# Patient Record
Sex: Female | Born: 1940 | Race: White | Hispanic: No | Marital: Married | Smoking: Current every day smoker
Health system: Southern US, Community
[De-identification: ages and names within clinical notes are randomized; demographics above are authoritative.]

## PROBLEM LIST (undated history)

## (undated) DIAGNOSIS — N182 Chronic kidney disease, stage 2 (mild): Secondary | ICD-10-CM

## (undated) DIAGNOSIS — G43909 Migraine, unspecified, not intractable, without status migrainosus: Secondary | ICD-10-CM

## (undated) DIAGNOSIS — C50919 Malignant neoplasm of unspecified site of unspecified female breast: Secondary | ICD-10-CM

## (undated) DIAGNOSIS — Z9989 Dependence on other enabling machines and devices: Secondary | ICD-10-CM

## (undated) DIAGNOSIS — J302 Other seasonal allergic rhinitis: Secondary | ICD-10-CM

## (undated) DIAGNOSIS — E119 Type 2 diabetes mellitus without complications: Secondary | ICD-10-CM

## (undated) DIAGNOSIS — I509 Heart failure, unspecified: Secondary | ICD-10-CM

## (undated) DIAGNOSIS — E039 Hypothyroidism, unspecified: Secondary | ICD-10-CM

## (undated) DIAGNOSIS — M1990 Unspecified osteoarthritis, unspecified site: Secondary | ICD-10-CM

## (undated) DIAGNOSIS — I259 Chronic ischemic heart disease, unspecified: Secondary | ICD-10-CM

## (undated) DIAGNOSIS — E669 Obesity, unspecified: Secondary | ICD-10-CM

## (undated) DIAGNOSIS — F329 Major depressive disorder, single episode, unspecified: Secondary | ICD-10-CM

## (undated) DIAGNOSIS — G4733 Obstructive sleep apnea (adult) (pediatric): Secondary | ICD-10-CM

## (undated) DIAGNOSIS — J189 Pneumonia, unspecified organism: Secondary | ICD-10-CM

## (undated) DIAGNOSIS — K219 Gastro-esophageal reflux disease without esophagitis: Secondary | ICD-10-CM

## (undated) DIAGNOSIS — E785 Hyperlipidemia, unspecified: Secondary | ICD-10-CM

## (undated) DIAGNOSIS — I10 Essential (primary) hypertension: Secondary | ICD-10-CM

## (undated) DIAGNOSIS — I219 Acute myocardial infarction, unspecified: Secondary | ICD-10-CM

## (undated) DIAGNOSIS — Z8719 Personal history of other diseases of the digestive system: Secondary | ICD-10-CM

## (undated) DIAGNOSIS — F32A Depression, unspecified: Secondary | ICD-10-CM

## (undated) HISTORY — DX: Hypothyroidism, unspecified: E03.9

## (undated) HISTORY — DX: Depression, unspecified: F32.A

## (undated) HISTORY — PX: SHOULDER ARTHROSCOPY W/ ROTATOR CUFF REPAIR: SHX2400

## (undated) HISTORY — DX: Gastro-esophageal reflux disease without esophagitis: K21.9

## (undated) HISTORY — DX: Major depressive disorder, single episode, unspecified: F32.9

## (undated) HISTORY — DX: Chronic ischemic heart disease, unspecified: I25.9

## (undated) HISTORY — DX: Hyperlipidemia, unspecified: E78.5

## (undated) HISTORY — DX: Essential (primary) hypertension: I10

## (undated) HISTORY — PX: KNEE ARTHROSCOPY: SHX127

## (undated) HISTORY — PX: DILATION AND CURETTAGE OF UTERUS: SHX78

## (undated) HISTORY — PX: CHOLECYSTECTOMY OPEN: SUR202

## (undated) HISTORY — DX: Obesity, unspecified: E66.9

## (undated) HISTORY — PX: CATARACT EXTRACTION W/ INTRAOCULAR LENS  IMPLANT, BILATERAL: SHX1307

---

## 1997-09-19 ENCOUNTER — Other Ambulatory Visit: Admission: RE | Admit: 1997-09-19 | Discharge: 1997-09-19 | Payer: Self-pay | Admitting: *Deleted

## 1998-03-24 DIAGNOSIS — C50919 Malignant neoplasm of unspecified site of unspecified female breast: Secondary | ICD-10-CM

## 1998-03-24 HISTORY — PX: BREAST LUMPECTOMY: SHX2

## 1998-03-24 HISTORY — PX: BREAST BIOPSY: SHX20

## 1998-03-24 HISTORY — DX: Malignant neoplasm of unspecified site of unspecified female breast: C50.919

## 1998-08-14 ENCOUNTER — Ambulatory Visit (HOSPITAL_COMMUNITY): Admission: RE | Admit: 1998-08-14 | Discharge: 1998-08-14 | Payer: Self-pay | Admitting: *Deleted

## 1998-08-14 ENCOUNTER — Encounter: Payer: Self-pay | Admitting: *Deleted

## 1998-08-22 ENCOUNTER — Ambulatory Visit (HOSPITAL_COMMUNITY): Admission: RE | Admit: 1998-08-22 | Discharge: 1998-08-22 | Payer: Self-pay | Admitting: *Deleted

## 1998-08-22 ENCOUNTER — Encounter: Payer: Self-pay | Admitting: *Deleted

## 1998-09-20 ENCOUNTER — Other Ambulatory Visit: Admission: RE | Admit: 1998-09-20 | Discharge: 1998-09-20 | Payer: Self-pay | Admitting: *Deleted

## 1999-01-28 ENCOUNTER — Encounter: Payer: Self-pay | Admitting: *Deleted

## 1999-01-28 ENCOUNTER — Encounter (INDEPENDENT_AMBULATORY_CARE_PROVIDER_SITE_OTHER): Payer: Self-pay | Admitting: Specialist

## 1999-01-28 ENCOUNTER — Ambulatory Visit (HOSPITAL_COMMUNITY): Admission: RE | Admit: 1999-01-28 | Discharge: 1999-01-28 | Payer: Self-pay | Admitting: *Deleted

## 1999-02-05 ENCOUNTER — Encounter: Payer: Self-pay | Admitting: General Surgery

## 1999-02-06 ENCOUNTER — Ambulatory Visit (HOSPITAL_COMMUNITY): Admission: RE | Admit: 1999-02-06 | Discharge: 1999-02-07 | Payer: Self-pay | Admitting: General Surgery

## 1999-02-06 ENCOUNTER — Encounter (INDEPENDENT_AMBULATORY_CARE_PROVIDER_SITE_OTHER): Payer: Self-pay | Admitting: *Deleted

## 1999-02-06 ENCOUNTER — Encounter: Payer: Self-pay | Admitting: General Surgery

## 1999-02-18 ENCOUNTER — Encounter: Admission: RE | Admit: 1999-02-18 | Discharge: 1999-05-19 | Payer: Self-pay | Admitting: Radiation Oncology

## 1999-02-25 ENCOUNTER — Encounter (HOSPITAL_COMMUNITY): Payer: Self-pay | Admitting: Oncology

## 1999-02-25 ENCOUNTER — Ambulatory Visit (HOSPITAL_COMMUNITY): Admission: RE | Admit: 1999-02-25 | Discharge: 1999-02-25 | Payer: Self-pay | Admitting: Oncology

## 1999-03-05 ENCOUNTER — Ambulatory Visit (HOSPITAL_COMMUNITY): Admission: RE | Admit: 1999-03-05 | Discharge: 1999-03-06 | Payer: Self-pay | Admitting: General Surgery

## 1999-03-05 ENCOUNTER — Encounter: Payer: Self-pay | Admitting: General Surgery

## 1999-03-05 ENCOUNTER — Encounter (INDEPENDENT_AMBULATORY_CARE_PROVIDER_SITE_OTHER): Payer: Self-pay | Admitting: *Deleted

## 1999-07-10 ENCOUNTER — Encounter: Admission: RE | Admit: 1999-07-10 | Discharge: 1999-10-08 | Payer: Self-pay | Admitting: Radiation Oncology

## 1999-07-12 ENCOUNTER — Encounter: Admission: RE | Admit: 1999-07-12 | Discharge: 1999-07-12 | Payer: Self-pay | Admitting: Radiation Oncology

## 1999-09-05 ENCOUNTER — Emergency Department (HOSPITAL_COMMUNITY): Admission: EM | Admit: 1999-09-05 | Discharge: 1999-09-05 | Payer: Self-pay | Admitting: Emergency Medicine

## 1999-09-05 ENCOUNTER — Encounter: Payer: Self-pay | Admitting: Emergency Medicine

## 1999-09-27 ENCOUNTER — Ambulatory Visit (HOSPITAL_BASED_OUTPATIENT_CLINIC_OR_DEPARTMENT_OTHER): Admission: RE | Admit: 1999-09-27 | Discharge: 1999-09-27 | Payer: Self-pay | Admitting: General Surgery

## 1999-10-09 ENCOUNTER — Other Ambulatory Visit: Admission: RE | Admit: 1999-10-09 | Discharge: 1999-10-09 | Payer: Self-pay | Admitting: *Deleted

## 1999-12-23 ENCOUNTER — Encounter: Payer: Self-pay | Admitting: Endocrinology

## 1999-12-23 ENCOUNTER — Encounter: Admission: RE | Admit: 1999-12-23 | Discharge: 1999-12-23 | Payer: Self-pay | Admitting: Endocrinology

## 2000-04-06 ENCOUNTER — Encounter: Admission: RE | Admit: 2000-04-06 | Discharge: 2000-06-17 | Payer: Self-pay | Admitting: Oncology

## 2000-07-28 ENCOUNTER — Encounter: Payer: Self-pay | Admitting: General Surgery

## 2000-07-28 ENCOUNTER — Encounter: Admission: RE | Admit: 2000-07-28 | Discharge: 2000-07-28 | Payer: Self-pay | Admitting: General Surgery

## 2000-10-14 ENCOUNTER — Other Ambulatory Visit: Admission: RE | Admit: 2000-10-14 | Discharge: 2000-10-14 | Payer: Self-pay | Admitting: Obstetrics and Gynecology

## 2001-01-01 ENCOUNTER — Encounter: Payer: Self-pay | Admitting: *Deleted

## 2001-01-01 ENCOUNTER — Encounter: Admission: RE | Admit: 2001-01-01 | Discharge: 2001-01-01 | Payer: Self-pay | Admitting: *Deleted

## 2001-04-28 ENCOUNTER — Ambulatory Visit (HOSPITAL_COMMUNITY): Admission: RE | Admit: 2001-04-28 | Discharge: 2001-04-28 | Payer: Self-pay | Admitting: Oncology

## 2001-04-28 ENCOUNTER — Encounter (HOSPITAL_COMMUNITY): Payer: Self-pay | Admitting: Oncology

## 2001-06-02 ENCOUNTER — Ambulatory Visit (HOSPITAL_BASED_OUTPATIENT_CLINIC_OR_DEPARTMENT_OTHER): Admission: RE | Admit: 2001-06-02 | Discharge: 2001-06-02 | Payer: Self-pay | Admitting: Otolaryngology

## 2001-07-23 ENCOUNTER — Encounter (INDEPENDENT_AMBULATORY_CARE_PROVIDER_SITE_OTHER): Payer: Self-pay | Admitting: Specialist

## 2001-07-23 ENCOUNTER — Ambulatory Visit (HOSPITAL_COMMUNITY): Admission: RE | Admit: 2001-07-23 | Discharge: 2001-07-23 | Payer: Self-pay | Admitting: *Deleted

## 2001-11-15 ENCOUNTER — Ambulatory Visit (HOSPITAL_BASED_OUTPATIENT_CLINIC_OR_DEPARTMENT_OTHER): Admission: RE | Admit: 2001-11-15 | Discharge: 2001-11-15 | Payer: Self-pay | Admitting: Internal Medicine

## 2001-12-28 ENCOUNTER — Other Ambulatory Visit: Admission: RE | Admit: 2001-12-28 | Discharge: 2001-12-28 | Payer: Self-pay | Admitting: Obstetrics and Gynecology

## 2002-01-03 ENCOUNTER — Encounter: Payer: Self-pay | Admitting: *Deleted

## 2002-01-03 ENCOUNTER — Encounter: Admission: RE | Admit: 2002-01-03 | Discharge: 2002-01-03 | Payer: Self-pay | Admitting: *Deleted

## 2002-04-26 ENCOUNTER — Encounter (HOSPITAL_COMMUNITY): Payer: Self-pay | Admitting: Oncology

## 2002-04-26 ENCOUNTER — Ambulatory Visit (HOSPITAL_COMMUNITY): Admission: RE | Admit: 2002-04-26 | Discharge: 2002-04-26 | Payer: Self-pay | Admitting: Oncology

## 2002-05-06 ENCOUNTER — Inpatient Hospital Stay (HOSPITAL_COMMUNITY): Admission: EM | Admit: 2002-05-06 | Discharge: 2002-05-10 | Payer: Self-pay

## 2002-05-06 ENCOUNTER — Encounter: Payer: Self-pay | Admitting: Emergency Medicine

## 2002-05-09 HISTORY — PX: CARDIAC CATHETERIZATION: SHX172

## 2002-05-15 ENCOUNTER — Emergency Department (HOSPITAL_COMMUNITY): Admission: EM | Admit: 2002-05-15 | Discharge: 2002-05-15 | Payer: Self-pay | Admitting: Emergency Medicine

## 2002-05-15 ENCOUNTER — Encounter: Payer: Self-pay | Admitting: Emergency Medicine

## 2002-05-30 ENCOUNTER — Encounter (HOSPITAL_COMMUNITY): Admission: RE | Admit: 2002-05-30 | Discharge: 2002-07-07 | Payer: Self-pay | Admitting: Cardiology

## 2002-07-12 ENCOUNTER — Encounter (HOSPITAL_COMMUNITY): Admission: RE | Admit: 2002-07-12 | Discharge: 2002-10-10 | Payer: Self-pay | Admitting: Cardiology

## 2002-09-29 ENCOUNTER — Ambulatory Visit: Admission: RE | Admit: 2002-09-29 | Discharge: 2002-09-29 | Payer: Self-pay | Admitting: Oncology

## 2002-10-23 ENCOUNTER — Encounter (HOSPITAL_COMMUNITY): Admission: RE | Admit: 2002-10-23 | Discharge: 2003-01-21 | Payer: Self-pay | Admitting: Cardiology

## 2002-10-25 ENCOUNTER — Encounter: Admission: RE | Admit: 2002-10-25 | Discharge: 2003-01-23 | Payer: Self-pay | Admitting: Oncology

## 2002-11-14 ENCOUNTER — Observation Stay (HOSPITAL_COMMUNITY): Admission: RE | Admit: 2002-11-14 | Discharge: 2002-11-15 | Payer: Self-pay | Admitting: Cardiology

## 2002-11-14 HISTORY — PX: CARDIAC CATHETERIZATION: SHX172

## 2003-01-05 ENCOUNTER — Encounter: Admission: RE | Admit: 2003-01-05 | Discharge: 2003-01-05 | Payer: Self-pay | Admitting: General Surgery

## 2003-01-05 ENCOUNTER — Encounter: Payer: Self-pay | Admitting: General Surgery

## 2003-01-17 ENCOUNTER — Other Ambulatory Visit: Admission: RE | Admit: 2003-01-17 | Discharge: 2003-01-17 | Payer: Self-pay | Admitting: Obstetrics and Gynecology

## 2003-03-25 HISTORY — PX: REDUCTION MAMMAPLASTY: SUR839

## 2003-04-10 ENCOUNTER — Ambulatory Visit (HOSPITAL_COMMUNITY): Admission: RE | Admit: 2003-04-10 | Discharge: 2003-04-10 | Payer: Self-pay | Admitting: Oncology

## 2003-05-08 ENCOUNTER — Ambulatory Visit (HOSPITAL_BASED_OUTPATIENT_CLINIC_OR_DEPARTMENT_OTHER): Admission: RE | Admit: 2003-05-08 | Discharge: 2003-05-08 | Payer: Self-pay | Admitting: Orthopedic Surgery

## 2003-05-08 ENCOUNTER — Ambulatory Visit (HOSPITAL_COMMUNITY): Admission: RE | Admit: 2003-05-08 | Discharge: 2003-05-08 | Payer: Self-pay | Admitting: Orthopedic Surgery

## 2003-06-06 ENCOUNTER — Encounter: Admission: RE | Admit: 2003-06-06 | Discharge: 2003-06-06 | Payer: Self-pay | Admitting: General Surgery

## 2004-01-10 ENCOUNTER — Encounter: Admission: RE | Admit: 2004-01-10 | Discharge: 2004-01-10 | Payer: Self-pay | Admitting: General Surgery

## 2004-05-10 ENCOUNTER — Ambulatory Visit (HOSPITAL_COMMUNITY): Admission: RE | Admit: 2004-05-10 | Discharge: 2004-05-10 | Payer: Self-pay | Admitting: Oncology

## 2004-05-10 ENCOUNTER — Ambulatory Visit: Payer: Self-pay | Admitting: Oncology

## 2004-11-08 ENCOUNTER — Ambulatory Visit: Payer: Self-pay | Admitting: Oncology

## 2005-01-10 ENCOUNTER — Encounter: Admission: RE | Admit: 2005-01-10 | Discharge: 2005-01-10 | Payer: Self-pay | Admitting: Endocrinology

## 2005-01-11 ENCOUNTER — Emergency Department (HOSPITAL_COMMUNITY): Admission: EM | Admit: 2005-01-11 | Discharge: 2005-01-11 | Payer: Self-pay | Admitting: Emergency Medicine

## 2005-04-28 ENCOUNTER — Ambulatory Visit (HOSPITAL_BASED_OUTPATIENT_CLINIC_OR_DEPARTMENT_OTHER): Admission: RE | Admit: 2005-04-28 | Discharge: 2005-04-28 | Payer: Self-pay | Admitting: Orthopedic Surgery

## 2005-05-09 ENCOUNTER — Ambulatory Visit: Payer: Self-pay | Admitting: Oncology

## 2005-05-12 ENCOUNTER — Ambulatory Visit (HOSPITAL_COMMUNITY): Admission: RE | Admit: 2005-05-12 | Discharge: 2005-05-12 | Payer: Self-pay | Admitting: Oncology

## 2005-06-26 ENCOUNTER — Encounter: Admission: RE | Admit: 2005-06-26 | Discharge: 2005-06-26 | Payer: Self-pay | Admitting: Oncology

## 2005-11-05 ENCOUNTER — Ambulatory Visit: Payer: Self-pay | Admitting: Oncology

## 2005-11-10 LAB — COMPREHENSIVE METABOLIC PANEL
AST: 25 U/L (ref 0–37)
Albumin: 3.9 g/dL (ref 3.5–5.2)
Alkaline Phosphatase: 45 U/L (ref 39–117)
Calcium: 9 mg/dL (ref 8.4–10.5)
Chloride: 99 mEq/L (ref 96–112)
Potassium: 4.3 mEq/L (ref 3.5–5.3)
Sodium: 135 mEq/L (ref 135–145)
Total Protein: 6.9 g/dL (ref 6.0–8.3)

## 2005-11-10 LAB — CBC WITH DIFFERENTIAL/PLATELET
BASO%: 0.4 % (ref 0.0–2.0)
EOS%: 1.1 % (ref 0.0–7.0)
LYMPH%: 31.3 % (ref 14.0–48.0)
MCH: 31.6 pg (ref 26.0–34.0)
MCHC: 34.7 g/dL (ref 32.0–36.0)
MONO#: 0.3 10*3/uL (ref 0.1–0.9)
Platelets: 192 10*3/uL (ref 145–400)
RBC: 4.33 10*6/uL (ref 3.70–5.32)
WBC: 4 10*3/uL (ref 3.9–10.0)

## 2006-01-06 ENCOUNTER — Encounter: Admission: RE | Admit: 2006-01-06 | Discharge: 2006-01-06 | Payer: Self-pay | Admitting: General Surgery

## 2006-02-24 ENCOUNTER — Encounter: Admission: RE | Admit: 2006-02-24 | Discharge: 2006-05-25 | Payer: Self-pay | Admitting: Endocrinology

## 2006-05-07 ENCOUNTER — Ambulatory Visit: Payer: Self-pay | Admitting: Oncology

## 2006-05-12 ENCOUNTER — Ambulatory Visit (HOSPITAL_COMMUNITY): Admission: RE | Admit: 2006-05-12 | Discharge: 2006-05-12 | Payer: Self-pay | Admitting: Oncology

## 2006-05-12 LAB — COMPREHENSIVE METABOLIC PANEL
ALT: 40 U/L — ABNORMAL HIGH (ref 0–35)
AST: 39 U/L — ABNORMAL HIGH (ref 0–37)
Alkaline Phosphatase: 51 U/L (ref 39–117)
Glucose, Bld: 266 mg/dL — ABNORMAL HIGH (ref 70–99)
Sodium: 137 mEq/L (ref 135–145)
Total Bilirubin: 0.6 mg/dL (ref 0.3–1.2)
Total Protein: 7.2 g/dL (ref 6.0–8.3)

## 2006-05-12 LAB — CBC WITH DIFFERENTIAL/PLATELET
BASO%: 0.6 % (ref 0.0–2.0)
EOS%: 1.1 % (ref 0.0–7.0)
LYMPH%: 31.2 % (ref 14.0–48.0)
MCH: 32 pg (ref 26.0–34.0)
MCHC: 35 g/dL (ref 32.0–36.0)
MCV: 91.4 fL (ref 81.0–101.0)
MONO%: 8.6 % (ref 0.0–13.0)
Platelets: 201 10*3/uL (ref 145–400)
RBC: 4.48 10*6/uL (ref 3.70–5.32)
RDW: 14.1 % (ref 11.3–14.5)

## 2006-11-06 ENCOUNTER — Ambulatory Visit: Payer: Self-pay | Admitting: Oncology

## 2006-11-10 ENCOUNTER — Ambulatory Visit (HOSPITAL_COMMUNITY): Admission: RE | Admit: 2006-11-10 | Discharge: 2006-11-10 | Payer: Self-pay | Admitting: *Deleted

## 2006-11-17 LAB — COMPREHENSIVE METABOLIC PANEL
AST: 29 U/L (ref 0–37)
Albumin: 3.8 g/dL (ref 3.5–5.2)
Alkaline Phosphatase: 57 U/L (ref 39–117)
Glucose, Bld: 272 mg/dL — ABNORMAL HIGH (ref 70–99)
Potassium: 4.7 mEq/L (ref 3.5–5.3)
Sodium: 138 mEq/L (ref 135–145)
Total Bilirubin: 0.7 mg/dL (ref 0.3–1.2)
Total Protein: 6.8 g/dL (ref 6.0–8.3)

## 2006-11-17 LAB — CBC WITH DIFFERENTIAL/PLATELET
BASO%: 0.3 % (ref 0.0–2.0)
HCT: 40.5 % (ref 34.8–46.6)
LYMPH%: 25 % (ref 14.0–48.0)
MCHC: 35.3 g/dL (ref 32.0–36.0)
MCV: 88.8 fL (ref 81.0–101.0)
MONO#: 0.4 10*3/uL (ref 0.1–0.9)
NEUT%: 67.2 % (ref 39.6–76.8)
Platelets: 199 10*3/uL (ref 145–400)
WBC: 5.5 10*3/uL (ref 3.9–10.0)

## 2007-01-08 ENCOUNTER — Encounter: Admission: RE | Admit: 2007-01-08 | Discharge: 2007-01-08 | Payer: Self-pay | Admitting: General Surgery

## 2007-05-08 ENCOUNTER — Inpatient Hospital Stay (HOSPITAL_COMMUNITY): Admission: EM | Admit: 2007-05-08 | Discharge: 2007-05-11 | Payer: Self-pay | Admitting: Emergency Medicine

## 2007-05-18 ENCOUNTER — Ambulatory Visit: Payer: Self-pay | Admitting: Oncology

## 2007-06-28 ENCOUNTER — Ambulatory Visit: Payer: Self-pay | Admitting: Oncology

## 2007-06-29 ENCOUNTER — Inpatient Hospital Stay (HOSPITAL_BASED_OUTPATIENT_CLINIC_OR_DEPARTMENT_OTHER): Admission: RE | Admit: 2007-06-29 | Discharge: 2007-06-29 | Payer: Self-pay | Admitting: Cardiology

## 2007-06-29 HISTORY — PX: CARDIAC CATHETERIZATION: SHX172

## 2007-07-13 ENCOUNTER — Ambulatory Visit (HOSPITAL_COMMUNITY): Admission: RE | Admit: 2007-07-13 | Discharge: 2007-07-13 | Payer: Self-pay | Admitting: Oncology

## 2007-07-13 LAB — COMPREHENSIVE METABOLIC PANEL
ALT: 25 U/L (ref 0–35)
Albumin: 3.7 g/dL (ref 3.5–5.2)
Alkaline Phosphatase: 51 U/L (ref 39–117)
CO2: 23 mEq/L (ref 19–32)
Glucose, Bld: 504 mg/dL (ref 70–99)
Potassium: 4.3 mEq/L (ref 3.5–5.3)
Sodium: 133 mEq/L — ABNORMAL LOW (ref 135–145)
Total Protein: 7.2 g/dL (ref 6.0–8.3)

## 2007-07-13 LAB — CBC WITH DIFFERENTIAL/PLATELET
Eosinophils Absolute: 0 10*3/uL (ref 0.0–0.5)
MONO#: 0.3 10*3/uL (ref 0.1–0.9)
NEUT#: 3.6 10*3/uL (ref 1.5–6.5)
RBC: 4.36 10*6/uL (ref 3.70–5.32)
RDW: 13.3 % (ref 11.3–14.5)
WBC: 5.8 10*3/uL (ref 3.9–10.0)

## 2007-07-13 LAB — LACTATE DEHYDROGENASE: LDH: 168 U/L (ref 94–250)

## 2007-10-28 ENCOUNTER — Encounter: Admission: RE | Admit: 2007-10-28 | Discharge: 2007-10-28 | Payer: Self-pay | Admitting: General Surgery

## 2008-01-24 ENCOUNTER — Encounter: Admission: RE | Admit: 2008-01-24 | Discharge: 2008-01-24 | Payer: Self-pay | Admitting: Oncology

## 2008-02-11 ENCOUNTER — Ambulatory Visit: Payer: Self-pay | Admitting: Oncology

## 2008-02-15 LAB — COMPREHENSIVE METABOLIC PANEL
ALT: 30 U/L (ref 0–35)
AST: 27 U/L (ref 0–37)
Albumin: 3.9 g/dL (ref 3.5–5.2)
Alkaline Phosphatase: 44 U/L (ref 39–117)
Potassium: 3.9 mEq/L (ref 3.5–5.3)
Sodium: 138 mEq/L (ref 135–145)
Total Bilirubin: 0.5 mg/dL (ref 0.3–1.2)
Total Protein: 7 g/dL (ref 6.0–8.3)

## 2008-02-15 LAB — CBC WITH DIFFERENTIAL/PLATELET
BASO%: 0.4 % (ref 0.0–2.0)
EOS%: 1.4 % (ref 0.0–7.0)
Eosinophils Absolute: 0.1 10*3/uL (ref 0.0–0.5)
LYMPH%: 28.6 % (ref 14.0–48.0)
MCH: 31.3 pg (ref 26.0–34.0)
MCHC: 34.2 g/dL (ref 32.0–36.0)
MCV: 91.3 fL (ref 81.0–101.0)
MONO%: 6.8 % (ref 0.0–13.0)
NEUT#: 3 10*3/uL (ref 1.5–6.5)
RBC: 4.55 10*6/uL (ref 3.70–5.32)
RDW: 13.8 % (ref 11.3–14.5)

## 2008-02-15 LAB — LACTATE DEHYDROGENASE: LDH: 160 U/L (ref 94–250)

## 2009-01-24 ENCOUNTER — Encounter: Admission: RE | Admit: 2009-01-24 | Discharge: 2009-01-24 | Payer: Self-pay | Admitting: Endocrinology

## 2009-02-09 ENCOUNTER — Ambulatory Visit: Payer: Self-pay | Admitting: Oncology

## 2009-02-13 LAB — CBC WITH DIFFERENTIAL/PLATELET
Basophils Absolute: 0 10*3/uL (ref 0.0–0.1)
EOS%: 1.2 % (ref 0.0–7.0)
Eosinophils Absolute: 0.1 10*3/uL (ref 0.0–0.5)
HGB: 14.7 g/dL (ref 11.6–15.9)
NEUT#: 4.3 10*3/uL (ref 1.5–6.5)
RDW: 13.6 % (ref 11.2–14.5)
lymph#: 1.6 10*3/uL (ref 0.9–3.3)

## 2009-02-13 LAB — COMPREHENSIVE METABOLIC PANEL
AST: 29 U/L (ref 0–37)
Albumin: 3.9 g/dL (ref 3.5–5.2)
BUN: 18 mg/dL (ref 6–23)
Calcium: 9.1 mg/dL (ref 8.4–10.5)
Chloride: 101 mEq/L (ref 96–112)
Potassium: 4.5 mEq/L (ref 3.5–5.3)
Sodium: 137 mEq/L (ref 135–145)
Total Protein: 6.5 g/dL (ref 6.0–8.3)

## 2009-04-19 ENCOUNTER — Encounter: Admission: RE | Admit: 2009-04-19 | Discharge: 2009-04-19 | Payer: Self-pay | Admitting: Orthopedic Surgery

## 2009-04-22 ENCOUNTER — Encounter: Admission: RE | Admit: 2009-04-22 | Discharge: 2009-04-22 | Payer: Self-pay | Admitting: Orthopedic Surgery

## 2009-08-10 ENCOUNTER — Encounter: Admission: RE | Admit: 2009-08-10 | Discharge: 2009-08-10 | Payer: Self-pay | Admitting: Endocrinology

## 2010-01-28 ENCOUNTER — Encounter: Admission: RE | Admit: 2010-01-28 | Discharge: 2010-01-28 | Payer: Self-pay | Admitting: Oncology

## 2010-02-15 ENCOUNTER — Ambulatory Visit: Payer: Self-pay | Admitting: Oncology

## 2010-02-19 ENCOUNTER — Ambulatory Visit (HOSPITAL_COMMUNITY)
Admission: RE | Admit: 2010-02-19 | Discharge: 2010-02-19 | Payer: Self-pay | Source: Home / Self Care | Admitting: Oncology

## 2010-02-19 LAB — CBC WITH DIFFERENTIAL/PLATELET
BASO%: 0.3 % (ref 0.0–2.0)
Eosinophils Absolute: 0.1 10*3/uL (ref 0.0–0.5)
LYMPH%: 34 % (ref 14.0–49.7)
MCHC: 34.9 g/dL (ref 31.5–36.0)
MONO#: 0.4 10*3/uL (ref 0.1–0.9)
NEUT#: 2.8 10*3/uL (ref 1.5–6.5)
Platelets: 192 10*3/uL (ref 145–400)
RBC: 4.47 10*6/uL (ref 3.70–5.45)
RDW: 13 % (ref 11.2–14.5)
WBC: 5 10*3/uL (ref 3.9–10.3)
lymph#: 1.7 10*3/uL (ref 0.9–3.3)

## 2010-02-19 LAB — COMPREHENSIVE METABOLIC PANEL
ALT: 23 U/L (ref 0–35)
Albumin: 4 g/dL (ref 3.5–5.2)
CO2: 28 mEq/L (ref 19–32)
Chloride: 99 mEq/L (ref 96–112)
Glucose, Bld: 402 mg/dL — ABNORMAL HIGH (ref 70–99)
Potassium: 4.4 mEq/L (ref 3.5–5.3)
Sodium: 138 mEq/L (ref 135–145)
Total Bilirubin: 0.5 mg/dL (ref 0.3–1.2)
Total Protein: 6.7 g/dL (ref 6.0–8.3)

## 2010-02-19 LAB — LACTATE DEHYDROGENASE: LDH: 158 U/L (ref 94–250)

## 2010-04-13 ENCOUNTER — Encounter: Payer: Self-pay | Admitting: General Surgery

## 2010-04-14 ENCOUNTER — Encounter: Payer: Self-pay | Admitting: General Surgery

## 2010-05-15 ENCOUNTER — Ambulatory Visit (INDEPENDENT_AMBULATORY_CARE_PROVIDER_SITE_OTHER): Payer: Medicare Other | Admitting: Cardiology

## 2010-05-15 DIAGNOSIS — I2510 Atherosclerotic heart disease of native coronary artery without angina pectoris: Secondary | ICD-10-CM

## 2010-05-15 DIAGNOSIS — I119 Hypertensive heart disease without heart failure: Secondary | ICD-10-CM

## 2010-05-22 ENCOUNTER — Telehealth (INDEPENDENT_AMBULATORY_CARE_PROVIDER_SITE_OTHER): Payer: Self-pay | Admitting: Radiology

## 2010-05-23 ENCOUNTER — Encounter: Payer: Self-pay | Admitting: Internal Medicine

## 2010-05-23 ENCOUNTER — Ambulatory Visit (HOSPITAL_COMMUNITY): Payer: Medicare Other | Attending: Cardiology

## 2010-05-23 ENCOUNTER — Encounter: Payer: Self-pay | Admitting: *Deleted

## 2010-05-23 ENCOUNTER — Encounter (INDEPENDENT_AMBULATORY_CARE_PROVIDER_SITE_OTHER): Payer: Self-pay | Admitting: *Deleted

## 2010-05-23 DIAGNOSIS — R0989 Other specified symptoms and signs involving the circulatory and respiratory systems: Secondary | ICD-10-CM

## 2010-05-23 DIAGNOSIS — I2510 Atherosclerotic heart disease of native coronary artery without angina pectoris: Secondary | ICD-10-CM | POA: Insufficient documentation

## 2010-05-23 DIAGNOSIS — R0789 Other chest pain: Secondary | ICD-10-CM

## 2010-05-23 HISTORY — PX: CARDIOVASCULAR STRESS TEST: SHX262

## 2010-05-30 NOTE — Assessment & Plan Note (Signed)
Summary: Cardiology Nuclear Testing  Nuclear Med Background Indications for Stress Test: Evaluation for Ischemia, PTCA Patency   History: Angioplasty, Asthma, Heart Catheterization, Myocardial Perfusion Study  History Comments: '04 PTCA-LAD; 3/09 ZOX:[id number] ischemia, EF=59%; 4/09 Cath:Patent PTCA site, heavily calcified LMCA, 60-70% CFX.(Med Tx)  Symptoms: Chest Tightness, Chest Tightness with Exertion, DOE, Fatigue, Palpitations  Symptoms Comments: Last episode of CP:2 days ago.   Nuclear Pre-Procedure Cardiac Risk Factors: Family History - CAD, Hypertension, IDDM Type 2, Lipids, Obesity Caffeine/Decaff Intake: none NPO After: 8:00 PM Lungs: Clear.  O2 Sat 98% on RA. IV 0.9% NS with Angio Cath: 20g     IV Site: R Antecubital IV Started by: Stanton Kidney, EMT-P Chest Size (in) 40     Cup Size C     Height (in): 62 Weight (lb): 200 BMI: 36.71 Tech Comments: All meds held this am.  Nuclear Med Study 1 or 2 day study:  1 day     Stress Test Type:  Treadmill/Lexiscan Reading MD:  Cassell Clement, MD     Referring MD:  Roger Shelter, MD Resting Radionuclide:  Technetium 10m Tetrofosmin     Resting Radionuclide Dose:  11.0 mCi  Stress Radionuclide:  Technetium 78m Tetrofosmin     Stress Radionuclide Dose:  33.0 mCi   Stress Protocol Exercise Time (min):  2:00 min     Max HR:  123 bpm     Predicted Max HR:  152 bpm  Max Systolic BP: 194 mm Hg     Percent Max HR:  80.92 %Rate Pressure Product:  98119  Lexiscan: 0.4 mg   Stress Test Technologist:  Rea [employer], CMA-N     Nuclear Technologist:  Domenic Polite, CNMT  Rest Procedure  Myocardial perfusion imaging was performed at rest 45 minutes following the intravenous administration of Technetium 90m Tetrofosmin.  Stress Procedure  The patient received IV Lexiscan 0.4 mg over 15-seconds with concurrent low level exercise and then Technetium 70m Tetrofosmin was injected at 30-seconds.  There were no significant changes with  infusion, other than occasional PVC's.  Quantitative spect images were obtained after a 45 minute delay.  QPS Raw Data Images:  Normal; no motion artifact; normal heart/lung ratio. Stress Images:  There is decreased uptake in the anterior wall. Rest Images:  There is decreased uptake in the anterior wall. Subtraction (SDS):  Old anteroapical scar with minimal reversibility Transient Ischemic Dilatation:  1.0  (Normal <1.22)  Lung/Heart Ratio:  0.35  (Normal <0.45)  Quantitative Gated Spect Images QGS EDV:  68 ml QGS ESV:  22 ml QGS EF:  68 % QGS cine images:  No wall motion abnormalities.  Findings Low risk nuclear study  Evidence for anterior (septal apical) infarct     Overall Impression  Exercise Capacity: Lexiscan with slight exercise BP Response: Normal blood pressure response. Clinical Symptoms: No chest pain ECG Impression: No significant ST segment change suggestive of ischemia. Overall Impression: Low risk stress nuclear study.  Old anteroapical scar with minimal reversible ischemia.  Appended Document: Cardiology Nuclear Testing COPY SENT TO DR.TENNANT

## 2010-05-30 NOTE — Progress Notes (Signed)
Summary: Nuc Pre-Procedure  Phone Note Call from Patient Call back at Home Phone [phone]   Caller: Patient Action Taken: Phone Call Completed Summary of Call: Reviewed information on My[employer] Information Sheet (see scanned document for further details).  Spoke with patient. Initial call taken by: Leonia Corona, RT-N,  May 22, 2010 1:15 PM     Nuclear Med Background Indications for Stress Test: Evaluation for Ischemia, PTCA Patency   History: Angioplasty, Heart Catheterization, Myocardial Perfusion Study  History Comments: 2004- PTCA-LAD 3/09- MPS- Ant. Ischemia. EF= 59% 4/09- Cath- Patent PTCA site. Heavily calcified LMCA. 60-70% CFX.(Med Tx) H/O Pulm. HTN  Symptoms: Chest Tightness    Nuclear Pre-Procedure Cardiac Risk Factors: Family History - CAD, Hypertension, Lipids, NIDDM

## 2010-08-06 NOTE — H&P (Signed)
Jillian Willis, Jillian Willis NO.:  [id number]   MEDICAL RECORD NO.:  [id number]          PATIENT TYPE:  EMS   LOCATION:  MAJO                         FACILITY:  [facility]   PHYSICIAN:  Eduard Clos, MDDATE OF BIRTH:  [birth year]   DATE OF ADMISSION:  05/08/2007  DATE OF DISCHARGE:                              HISTORY & PHYSICAL   CHIEF COMPLAINT:  Nausea and vomiting.   HISTORY OF PRESENT ILLNESS:  A 70 year old female with known history of  CAD, hypertension, diabetes mellitus type 2, breast cancer, goiter,  presented to the ER complaining of nausea and vomiting.  The patient  states that the nausea and vomiting started early midnight today.  The  patient denies any diarrhea along with the nausea and vomiting.  She had  a couple of episodes at midnight after which it subsided.  She had toast  and an egg today after which 2 hours later she started again having  nausea and vomiting 4-5 times and this time also there was no associated  diarrhea.  After each vomiting she had some epigastric discomfort which  was more a burning sensation which totally relieved after a few minutes.  The patient also in addition was found to be febrile and feeling dizzy.  She desired to come to the ER.  The patient was found to be febrile and  blood sugar was very high.  The patient was admitted for further workup.  The patient denies any chest pain, shortness of breath, weakness of  limbs, loss of consciousness, any diaphoresis or any diarrhea.   PAST MEDICAL HISTORY:  Diabetes mellitus type 2, hypertension, cancer  breast, CAD, hypothyroidism.   PAST SURGICAL HISTORY:  Breast lumpectomy on the left side and breast  reduction surgery.  Cholecystectomy, knee surgery and right elbow  surgery.   MEDICATIONS:  1. Prior to admission metoprolol 50 mg p.o. twice a day.  2. TriCor 145 mg p.o. daily.  3. Cytomel 25 mcg twice daily.  4. Meclizine 25 mg as needed.  5. Lexapro 20 mg daily.  6.  Lisinopril 20 mg daily.  7. Synthroid 0.175 mcg p.o. daily.  8. Vesicare 10 mg p.o. daily.  9. Allegra 180 mg once a day.   ALLERGIES:  CODEINE.   FAMILY HISTORY:  Noncontributory.   SOCIAL HISTORY:  The patient denies smoking cigarettes, drinking alcohol  or using any illegal drugs.   REVIEW OF SYSTEMS:  As per history of presenting illness.  Nothing else  significant.   PHYSICAL EXAMINATION:  GENERAL:  The patient was examined at bedside,  not in acute distress.  VITAL SIGNS:  Blood pressure is 119/50, pulse 97 per minute, temperature  101.2, respiratory rate 18 per minute.  HEENT:  Anicteric.  No pallor.  CHEST:  Bilateral air entry present.  No rhonchi.  No crepitation.  HEART:  S1, S2 heard.  ABDOMEN:  Soft, nontender.  Bowel sounds heard.  No guarding.  No  rigidity.  No discolorations.  Scars from previous surgeries are seen.  Bowel sounds are feeble.  CNS:  The patient is alert, awake, oriented to  time, place and person.  Both upper and lower limbs 5/5.  EXTREMITIES:  Peripheral pulses felt.  No edema.   LABS:  Acute abdominal series and chest x-ray, nothing acute.  CBC, WBC  is 15.2, hemoglobin 15.6, hematocrit 46, platelets 178, neutrophils 97%.  CMP, sodium 132, potassium 3.4, chloride 103, carbon dioxide 21, glucose  450, BUN 16, creatinine 1.3, anion gap 8.  AST 46, ALT 34, total 26.6,  albumin 3, calcium 8.2, lipase 25.  Urine cloudy.  Ketones negative.  Protein more than 300.  Nitrites positive.  Leukocytes small.  WBC 1120,  RBCs 21-50, bacteria many.  ABG pH of 7.47, pCO2 of 25.6, bicarbonate  18.6.   ASSESSMENT:  1. Sepsis probably secondary to urinary tract infection.  2. Intractable nausea and vomiting.  3. Dehydration.  4. Uncontrolled diabetes mellitus type 2.  5. History of coronary artery disease.  6. Hypothyroidism.  7. History of cancer breast.   PLAN:  To admit under team A of [facility].  Will continue glucose  stabilizer, start  empiric antibiotics including vancomycin, Cipro and  Zosyn.  Follow up blood cultures and urine cultures.  Will also obtain a  CAT scan of abdomen and pelvis.  Once the patient's blood sugar is more  controlled will change to Lantus insulin with sliding scale.  Will place  the patient on clear liquid diet.      Eduard Clos, MD  Electronically Signed     ANK/MEDQ  D:  05/08/2007  T:  05/08/2007  Job:  [id number]

## 2010-08-06 NOTE — H&P (Signed)
NAMESHIARA, MCGOUGH               ACCOUNT NO.:  [id number]   MEDICAL RECORD NO.:  [id number]           PATIENT TYPE:   LOCATION:                                 FACILITY:   PHYSICIAN:  Colleen Can. Deborah Chalk, M.D.DATE OF BIRTH:  1940/07/28   DATE OF ADMISSION:  06/29/2007  DATE OF DISCHARGE:                              HISTORY & PHYSICAL   CHIEF COMPLAINT:  Fatigue.   HISTORY OF PRESENT ILLNESS:  Mrs. Salaam is a 70 year old morbidly obese  female.  She has known ischemic heart disease with a remote history of  angioplasty.  She presents now for repeat diagnostic cardiac  catheterization due to abnormal stress test.  She was hospitalized in  February with severe kidney infection/sepsis.  She basically recovered  from that.  There was some concern about bradycardia and her medicines  were adjusted accordingly.  She was seen back in follow-up towards the  mid part of March.  She had had no complaints of chest pain,  lightheadedness or dizziness but she had had increasing fatigue.  A  stress Cardiolite study was performed over two days which demonstrated  probable anterior ischemia with well-preserved LV systolic function.  She is now referred for cardiac catheterization.   PAST MEDICAL HISTORY:  1. Known ischemic heart disease.  She has had previous angioplasty to      the LAD in February 2004.  Her last catheterization was in August      2004.  2. Morbid obesity.  3. Hyperlipidemia.  4. Diabetes.  5. Hypertension.  6. Pulmonary hypertension.  7. Recent urosepsis.  8. Left breast cancer in 2000 status post surgery, chemotherapy and      radiation.  9. GERD  10.Hypothyroidism.  11.Seasonal allergies.   ALLERGIES:  CODEINE, CALAN, LIPITOR and AVANDIA.   CURRENT MEDICATIONS:  1. Lasix 40 mg a day.  2. Metoprolol 12.5 b.i.d.  3. Synthroid 175 mcg a day.  4. Lexapro 10 mg a day.  5. Advair p.r.n.  6. Flonase p.r.n.  7. Nitroglycerin p.r.n.  8. Lantus insulin 26 units in the  morning and 26 units in the evening.  9. NovoLog sliding scale insulin.  10.Baby aspirin daily.  11.Meclizine 25 mg p.r.n.  12.Allegra p.r.n.  13.Spiriva p.r.n.  14.Vesicare daily.  15.Lisinopril 10 mg a day.  16.Tricor 145 a day.  17.Cytomel 25 mg a day.   FAMILY HISTORY:  Father died of heart disease and a stroke at the age of  17.  Mother died of a heart attack in her 62s.   SOCIAL HISTORY:  She is married.  She is retired.  She was a previous  school custodian.   REVIEW OF SYSTEMS:  As noted above.  She has had progressive fatigue but  no chest pain, lightheadedness or dizziness.  She does have shortness of  breath if she overexerts.   PHYSICAL EXAMINATION:  GENERAL APPEARANCE:  She is clinically in no  acute distress.  VITAL SIGNS:  Her weight is 234-1/2 pound.  She is less than 5 feet  tall.  Blood pressure is 126/80, heart rate 68 and  regular, respirations  80s, afebrile.  SKIN:  Warm and dry.  Color is unremarkable.  LUNGS:  Clear.  CARDIAC:  Heart tones are distant.  ABDOMEN:  Obese.  EXTREMITIES:  Without edema.  NEUROLOGIC:  No gross focal deficits.   PERTINENT LABORATORY DATA:  Blood sugar was 499, BUN 28, creatinine 1.1,  sodium 135, potassium 4.9, chloride 97 and CO2 was 28.  Her CBC is  normal.  PT and PTT are unremarkable.   OVERALL IMPRESSION:  1. Abnormal Cardiolite study.  2. Remote history of ischemic heart disease with remote angioplasty in      2004 to the left anterior descending.  3. Morbid obesity.  4. Hypertension.  5. Diabetes.  6. Hyperlipidemia.   PLAN:  Will proceed on with diagnostic cardiac catheterization.  The  risks, procedure and benefits have been explained and she is willing to  proceed on Tuesday June 29, 2007.      Sharlee Blew, N.P.      Colleen Can. Deborah Chalk, M.D.  Electronically Signed    LC/MEDQ  D:  06/23/2007  T:  06/23/2007  Job:  [id number]   cc:   Brooke Bonito, M.D.

## 2010-08-06 NOTE — Op Note (Signed)
Jillian Willis, Jillian Willis               ACCOUNT NO.:  [id number]   MEDICAL RECORD NO.:  [id number]          PATIENT TYPE:  AMB   LOCATION:  ENDO                         FAC[facility]:  [facility]   PHYSICIAN:  Georgiana Spinner, M.D.    DATE OF BIRTH:  1940-07-23   DATE OF PROCEDURE:  11/10/2006  DATE OF DISCHARGE:                               OPERATIVE REPORT   PROCEDURE:  Colonoscopy.   INDICATIONS:  Colon cancer screening.   ANESTHESIA:  Fentanyl 100 mcg, Versed 5 mg.   PROCEDURE:  With the patient mildly sedated in the left lateral  decubitus position, the Pentax videoscopic colonoscope was inserted in  the rectum and passed under direct vision to the cecum, identified by  ileocecal valve and appendiceal orifice, both of which were  photographed.  From this point, the colonoscope was slowly withdrawn,  taking circumferential views of colonic mucosa, stopping in the rectum  which appeared normal on direct and showed hemorrhoids on retroflexed  view.  The endoscope was straightened and withdrawn.  The patient's  vital signs and pulse oximeter remained stable.  The patient tolerated  the procedure well without apparent complication.   FINDINGS:  Internal hemorrhoids.  Otherwise an unremarkable examination.   PLAN:  Have repeat examination in 5 years for screening.           ______________________________  Georgiana Spinner, M.D.     GMO/MEDQ  D:  11/10/2006  T:  11/10/2006  Job:  [id number]

## 2010-08-06 NOTE — Discharge Summary (Signed)
NAME:  Jillian Willis, Jillian Willis               ACCOUNT NO.:  [id number]   MEDICAL RECORD NO.:  [id number]          PATIENT TYPE:  INP   LOCATION:  3316                         FACILITY:  [facility]   PHYSICIAN:  Gaines Cartmell DICTATOR       DATE OF BIRTH:  08-07-1940   DATE OF ADMISSION:  05/08/2007  DATE OF DISCHARGE:                               DISCHARGE SUMMARY   HOSPITAL COURSE:  A 70 year old female with known history of diabetes  mellitus, type 2, CAD, hypothyroidism, hypertension who presented to the  ER with complaints of nausea and vomiting.  The patient was found to  have a very high blood sugar of 450.  In addition, the patient also had  a UA which was compatible with urinary tract infection.  The patient was  admitted to stepdown unit and started on empiric antibiotics.  Blood  cultures and urine cultures were obtained.  The patient was initially  started on IV insulin drip until blood sugar got more stabilized.  The  patient was converted to Lantus insulin.  CAT scan of the abdomen and  pelvis was done which showed left-sided pyelonephritis with no  hydronephrosis.  As the patient's condition improved, the patient was  started on oral diet.  During the hospital stay, the patient also had  mild diarrhea for which a C dif was done.  C dif toxin was negative.  The patient's blood culture showed no growth.  The patient's urine  culture showed E coli which was sensitive to ciprofloxacin.  The  patient's hemoglobin A1c was found to be 12.6.  As the patient's  condition improved and the patient was stable and was tolerating a diet,  the patient was discharged home.   CONDITION ON DISCHARGE:  Hemodynamically stable.   ASSESSMENT:  1. Sepsis secondary to left-sided pyelonephritis.  2. Uncontrolled diabetes mellitus, type 2.  3. Coronary artery disease.  4. Hypothyroidism.  5. Hyperlipidemia.  6. History of depression.   DISCHARGE MEDICATIONS:  1. Ciprofloxacin 500 mg p.o. twice daily for 12  days.  2. Metoprolol 12.5 mg p.o. b.i.d.  3. Lantus insulin 50 units subcutaneous in the a.m. and 30 units      subcutaneous in the p.m.  4. Lipitor 10 mg p.o. daily.  5. Vesicare 10 mg p.o. daily.  6. Synthroid 175 mcg p.o. daily.  7. Lexapro 20 mg p.o. daily.  8. Lisinopril 10 mg p.o. daily.  9. Cytomel 25 mcg p.o. daily.  10.Meclizine 25 mg p.o. t.i.d. p.r.n. for dizziness.  11.TriCor 140 mg p.o. daily.  12.Elavil 180 mg p.o. daily.  13.Aspirin 81 mg p.o. daily.  14.NovoLog insulin sliding scale 3 meals t.i.d., 150-200, 2 units      subcutaneous; 201-250, 4 units subcutaneous; 251-300, 6 units      subcutaneous; 301-350, 8 units subcutaneous; 351-400 10 units      subcutaneous.   PLAN:  The patient is to follow up with her primary care physician  within a week's time and check a basic metabolic panel and liver  function tests.  The patient is started on Lipitor due to high  LDL  levels which was found to be around 174.  The patient is advised about  the diverse reaction of Lipitor.  The patient is referred to outpatient  diabetic education program.  The patient strongly advised to be  compliant with diet and medications.   During the stay, the patient was also found to be mildly bradycardic for  which metoprolol was held.  At the time of discharge, metoprolol was  restarted at a lower dose of 12.5 p.o. b.i.d.           ______________________________  ZOXWRUE [id number]     DD/MEDQ  D:  05/11/2007  T:  05/11/2007  Job:  [id number]

## 2010-08-06 NOTE — Cardiovascular Report (Signed)
[name], [name]               ACCOUNT NO.:  [id number]   MEDICAL RECORD NO.:  [id number]          PATIENT TYPE:  OIB   LOCATION:  1967                         FACILITY:  [facility]   PHYSICIAN:  Colleen Can. Deborah Chalk, M.D.DATE OF BIRTH:  1941/01/31   DATE OF PROCEDURE:  06/29/2007  DATE OF DISCHARGE:                            CARDIAC CATHETERIZATION   PROCEDURE:  Left heart catheterization with selective coronary  angiography, left ventricular angiography.   INDICATIONS FOR PROCEDURE:  Abnormal stress Cardiolite study with  previous history of left anterior descending stenosis with angioplasty  in February 2004.   TYPE AND SITE OF ENTRY:  Percutaneous right femoral artery.   CATHETERS:  4-French four curved Judkins right-left coronary caths, 4-  French pigtail ventriculographic catheter, 4-French 3-DRC right coronary  catheter.   CONTRAST MATERIAL:  Omnipaque.   MEDICATION GIVEN PRIOR TO PROCEDURE:  Valium 10 mg.   MEDICATIONS GIVEN DURING PROCEDURE:  Versed 2 mg IV.   COMMENTS:  The patient tolerated the procedure well.   HEMODYNAMIC DATA:  The aortic pressure was 133/66, LV was 147/15-18.  There is no aortic valve gradient noted on pullback.   ANGIOGRAPHIC DATA:  Left ventricular angiogram was performed in RAO  position.  Overall cardiac size and silhouette were normal.  The global  ejection fraction was approximately 50-55%.  There is mild anterior  hypokinesis.   CORONARY ARTERIES:  Coronary arteries arise and distribute normally.   1. Left main coronary artery:  There is heavy calcification in the      left main coronary artery with 30-40% distal narrowing extending      into the left anterior descending.  2. Left circumflex:  Left circumflex had ostial 60-70% narrowing.  It      is a relatively small left circumflex.  3. left anterior descending:  Had 30-40% proximal narrowing and      scattered irregularities.  The angioplasty site in the midportion      of the  vessel was excellent and there are only scattered      irregularities through the left anterior descending coronary.  4. Intermediate:  The intermediate coronary artery is a large      bifurcating branch.  It had irregularities, but no significant      focal narrowing.  5. Right coronary artery:  The right coronary artery had minor      irregularities.  There is no catheter damping with the 4-French      catheters.  There was a small continuation branch and small      posterior descending vessel present.   Left ventricular angiogram was performed in the RAO position.  Overall  cardiac size and silhouette are normal.  There was mild anterior  hypokinesis with a global ejection fraction estimated to be at 50-55%.   OVERALL IMPRESSION:  1. Mild anterior hypokinesis with global ejection fraction of 50-55%.  2. Heavy left main coronary artery calcification with 30-40% distal      narrowing involving 60-70% narrowing at the ostium of the left      circumflex and minor 30-40% narrowing in the proximal  left anterior      descending.   DISCUSSION:  In light of these findings, it is felt that it would best  to manage Ms. Jillian Willis medically at this point in time.  She does have  coronary atherosclerosis, but it is felt that we are best to manage her  medically.  Management of cardiovascular risk factors and especially  addressing her morbid obesity will be our best chance of minimizing  significant cardiac event.      Colleen Can. Deborah Chalk, M.D.  Electronically Signed     SNT/MEDQ  D:  06/29/2007  T:  06/29/2007  Job:  [id number]

## 2010-08-09 NOTE — Consult Note (Signed)
NAMEMarland Willis  MONNICA, [name]                         ACCOUNT NO.:  [id number]   MEDICAL RECORD NO.:  [id number]                   PATIENT TYPE:  INP   LOCATION:  3731                                 FACILITY:  [facility]   PHYSICIAN:  Colleen Can. Deborah Chalk, M.D.            DATE OF BIRTH:  1940/12/31   DATE OF CONSULTATION:  05/07/2002  DATE OF DISCHARGE:                                   CONSULTATION   REFERRING PHYSICIAN:  Dr. Deirdre Peer. Polite.   REASON FOR CONSULTATION:  Thank you very much for asking [place] to see the  patient.   She is a very pleasant 70 year old female, a custodian at [employer],  who was admitted with chest pain.  She has a multitude of cardiovascular  risk factors.  She felt like she had a virus and a bug after eating out.  Both she and her __________ had a similar type of stool after shortly  returning from eating but she had tremors, nausea, vomiting and shortness of  breath and a chest pressure that felt like a baby lying on her chest,  somewhat steady.  She has felt weak since that time.  About two weeks ago, a  chest x-ray reportedly showed an enlarged heart and she had a recent  echocardiogram, but the report is still pending.  She has had a hacky cough  in the past year with dyspnea on exertion.  She has noted exertional dyspnea  in her job as a custodian of the school over the past several months.   PAST MEDICAL HISTORY:  She has had a history of obesity.  She has had left  breast cancer in 2000, a history of diabetes for 20 years, congestive heart  failure by history recently, although it is questionable with echocardiogram  pending, history of gastroesophageal reflux, hypercholesterolemia,  hypothyroidism, history of seasonal allergies, history of hypertension for  many years.   ALLERGIES:  CODEINE, CALAN SR, LIPITOR and AVANDIA.   CURRENT MEDICINES:  Glucotrol, Glucophage, Ramipril, Protonix.   FAMILY HISTORY:  Her father died of heart disease and CVA  at age 80.  Mother  died of MI in her 59s.  She has no sibling __________.   SOCIAL HISTORY:  She is married, no children.  She works as a Comptroller.   REVIEW OF SYSTEMS:  She has a history of reflux, chronic cough, dyspnea on  exertion.  She has arthritic complaints.   PHYSICAL EXAMINATION:  VITAL SIGNS:  Blood pressure 120/70, heart rate 70,  respiratory rate 20.  GENERAL:  She is in no acute distress.  SKIN:  Skin is warm and dry.  Color is normal.  NECK:  Neck is full.  No jugular venous distention.  LUNGS:  Lungs are relatively clear.  HEART:  Heart shows a regular rate and rhythm.  ABDOMEN:  Abdomen is markedly obese.  EXTREMITIES:  Extremities are without edema.  There  is excellent pedal  pulse.  NEUROLOGIC:  Neurologically, she is intact.   PERTINENT LABORATORY DATA:  White count 6000, hematocrit is 33, platelets  250,000.  Chemistries are remarkable for BUN and creatinine of 20 and 0.8,  glucose 255, potassium is 4.0.   EKG shows sinus with a leftward axis, poor R wave progression anteriorly but  no acute ST or T wave changes.   Troponin is negative.  Peak CK-MB is 5.   OVERALL IMPRESSION:  1. New onset of chest pressure, rule out myocardial infarction.  2. Hypertension.  3. Non-insulin-dependent diabetes mellitus.  4. Hypercholesterolemia.  5. Positive family history of heart disease.  6. Obesity.   PLAN:  We will check the labs.  We will arrange for her to have cardiac  catheterization.  I do not think that noninvasive testing will be  satisfactory, given the marked nature of the cardiovascular risk factors as  well as the significant obesity that would limit adequate evaluation.                                                 Colleen Can. Deborah Chalk, M.D.    SNT/MEDQ  D:  05/07/2002  T:  05/07/2002  Job:  [id number]   cc:   Samul Dada, M.D.  [address]  [place]  [place] [address]  Fax: [phone]   Brooke Bonito, M.D.  [address]  [place]  [place] [address]  Fax: [phone]   Deirdre Peer. Polite, M.D.  [address]  [place], [place] [address]  Fax: [phone]

## 2010-08-09 NOTE — Procedures (Signed)
[facility]  Patient:    Jillian Willis, Jillian Willis Visit Number: [id number] MRN: [id number]          Service Type: END Location: ENDO Attending Physician:  Sabino Gasser Dictated by:   Sabino Gasser, M.D. Proc. Date: 07/23/01 Admit Date:  07/23/2001                             Procedure Report  Re-dictation in case first one was not gotten, but has already been transcribed.  EGD and colonoscopy on same report. Dictated by:   Sabino Gasser, M.D. Attending Physician:  Sabino Gasser DD:  07/23/01 TD:  07/24/01 Job: [id number]

## 2010-08-29 ENCOUNTER — Other Ambulatory Visit (HOSPITAL_COMMUNITY): Payer: Self-pay | Admitting: Oncology

## 2010-08-29 ENCOUNTER — Encounter (HOSPITAL_BASED_OUTPATIENT_CLINIC_OR_DEPARTMENT_OTHER): Payer: Medicare Other | Admitting: Oncology

## 2010-08-29 DIAGNOSIS — Z853 Personal history of malignant neoplasm of breast: Secondary | ICD-10-CM

## 2010-08-29 LAB — CBC WITH DIFFERENTIAL/PLATELET
BASO%: 0.4 % (ref 0.0–2.0)
Basophils Absolute: 0 10*3/uL (ref 0.0–0.1)
EOS%: 2.1 % (ref 0.0–7.0)
HGB: 13.2 g/dL (ref 11.6–15.9)
MCH: 31.9 pg (ref 25.1–34.0)
MCHC: 34.6 g/dL (ref 31.5–36.0)
MCV: 92.2 fL (ref 79.5–101.0)
MONO%: 6.5 % (ref 0.0–14.0)
NEUT%: 61.6 % (ref 38.4–76.8)
RDW: 12.6 % (ref 11.2–14.5)

## 2010-08-29 LAB — COMPREHENSIVE METABOLIC PANEL
ALT: 33 U/L (ref 0–35)
AST: 38 U/L — ABNORMAL HIGH (ref 0–37)
Alkaline Phosphatase: 36 U/L — ABNORMAL LOW (ref 39–117)
BUN: 16 mg/dL (ref 6–23)
Creatinine, Ser: 1.09 mg/dL (ref 0.50–1.10)
Potassium: 4.2 mEq/L (ref 3.5–5.3)

## 2010-08-29 LAB — LACTATE DEHYDROGENASE: LDH: 150 U/L (ref 94–250)

## 2010-12-16 LAB — CBC
HCT: 38.7
HCT: 43.9
Hemoglobin: 12.5
Hemoglobin: 14.9
MCHC: 34
MCHC: 34.6
MCV: 92.7
MCV: 92.8
MCV: 92.9
Platelets: 178
Platelets: 187
RDW: 13.5
RDW: 13.9

## 2010-12-16 LAB — URINALYSIS, ROUTINE W REFLEX MICROSCOPIC
Glucose, UA: >1000 — AB
Glucose, UA: >1000 — AB
Ketones, ur: NEGATIVE
Nitrite: POSITIVE — AB
Protein, ur: >300 — AB
Specific Gravity, Urine: 1.03
pH: 5
pH: 6

## 2010-12-16 LAB — I-STAT 8, (EC8 V) (CONVERTED LAB)
Bicarbonate: 18.6 — ABNORMAL LOW
Glucose, Bld: 450 — ABNORMAL HIGH
HCT: 46
Hemoglobin: 15.6 — ABNORMAL HIGH
Operator id: 196461
Potassium: 3.4 — ABNORMAL LOW
Sodium: 132 — ABNORMAL LOW
TCO2: 19

## 2010-12-16 LAB — TSH: TSH: 27.708 — ABNORMAL HIGH

## 2010-12-16 LAB — DIFFERENTIAL
Basophils Absolute: 0
Basophils Relative: 0
Basophils Relative: 0
Eosinophils Absolute: 0.1
Lymphocytes Relative: 2 — ABNORMAL LOW
Lymphs Abs: 1.3
Monocytes Absolute: 0.1
Monocytes Absolute: 0.6
Monocytes Relative: 1 — ABNORMAL LOW
Monocytes Relative: 7
Neutro Abs: 15.7 — ABNORMAL HIGH
Neutrophils Relative %: 97 — ABNORMAL HIGH

## 2010-12-16 LAB — URINE MICROSCOPIC-ADD ON

## 2010-12-16 LAB — CULTURE, BLOOD (ROUTINE X 2)

## 2010-12-16 LAB — CLOSTRIDIUM DIFFICILE EIA

## 2010-12-16 LAB — URINE CULTURE

## 2010-12-16 LAB — BASIC METABOLIC PANEL
BUN: 14
CO2: 25
Calcium: 7.7 — ABNORMAL LOW
Chloride: 106
Creatinine, Ser: 1.34 — ABNORMAL HIGH
GFR calc Af Amer: 48 — ABNORMAL LOW
GFR calc Af Amer: 49 — ABNORMAL LOW
GFR calc non Af Amer: 40 — ABNORMAL LOW
GFR calc non Af Amer: 40 — ABNORMAL LOW
Glucose, Bld: 201 — ABNORMAL HIGH
Glucose, Bld: 97
Potassium: 3.1 — ABNORMAL LOW
Potassium: 3.7
Sodium: 138
Sodium: 139

## 2010-12-16 LAB — TROPONIN I: Troponin I: 0.05

## 2010-12-16 LAB — COMPREHENSIVE METABOLIC PANEL
Albumin: 3 — ABNORMAL LOW
BUN: 15
Creatinine, Ser: 1.34 — ABNORMAL HIGH
Glucose, Bld: 415 — ABNORMAL HIGH
Total Bilirubin: 1.8 — ABNORMAL HIGH
Total Protein: 6.6

## 2010-12-16 LAB — CARDIAC PANEL(CRET KIN+CKTOT+MB+TROPI)
CK, MB: 3.1
CK, MB: 4.8 — ABNORMAL HIGH
CK, MB: 6.9 — ABNORMAL HIGH
Relative Index: 1.5
Relative Index: 1.7
Total CK: 210 — ABNORMAL HIGH
Total CK: 408 — ABNORMAL HIGH
Troponin I: 0.03

## 2010-12-16 LAB — MAGNESIUM: Magnesium: 1.5

## 2010-12-16 LAB — HEMOGLOBIN A1C: Hgb A1c MFr Bld: 12.6 — ABNORMAL HIGH

## 2010-12-16 LAB — LIPID PANEL
Triglycerides: 123
VLDL: 25

## 2010-12-16 LAB — CK TOTAL AND CKMB (NOT AT ARMC): Total CK: 259 — ABNORMAL HIGH

## 2010-12-17 LAB — POCT I-STAT GLUCOSE
Glucose, Bld: 144 — ABNORMAL HIGH
Operator id: 194801

## 2010-12-20 ENCOUNTER — Other Ambulatory Visit (HOSPITAL_COMMUNITY): Payer: Self-pay | Admitting: Oncology

## 2010-12-20 DIAGNOSIS — Z1231 Encounter for screening mammogram for malignant neoplasm of breast: Secondary | ICD-10-CM

## 2011-01-31 ENCOUNTER — Ambulatory Visit
Admission: RE | Admit: 2011-01-31 | Discharge: 2011-01-31 | Disposition: A | Discharge status: Home / Self Care | Payer: Medicare Other | Source: Ambulatory Visit | Attending: Oncology | Admitting: Oncology

## 2011-01-31 DIAGNOSIS — Z1231 Encounter for screening mammogram for malignant neoplasm of breast: Secondary | ICD-10-CM

## 2011-02-06 ENCOUNTER — Telehealth: Payer: Self-pay | Admitting: Oncology

## 2011-02-06 NOTE — Telephone Encounter (Signed)
S/w pt, advised 11/30 appt has been cx'd due to Epic. Pt verbalized understanding.

## 2011-02-24 ENCOUNTER — Telehealth: Payer: Self-pay | Admitting: Oncology

## 2011-02-24 NOTE — Telephone Encounter (Signed)
S/w pt, gave appt 04/11/11 @ 9am r/s'd from 11/29 appt cx'd due to Epic.

## 2011-03-31 DIAGNOSIS — R05 Cough: Secondary | ICD-10-CM | POA: Diagnosis not present

## 2011-03-31 DIAGNOSIS — I10 Essential (primary) hypertension: Secondary | ICD-10-CM | POA: Diagnosis not present

## 2011-03-31 DIAGNOSIS — E789 Disorder of lipoprotein metabolism, unspecified: Secondary | ICD-10-CM | POA: Diagnosis not present

## 2011-04-11 ENCOUNTER — Other Ambulatory Visit: Payer: Medicare Other | Admitting: Lab

## 2011-04-11 ENCOUNTER — Ambulatory Visit: Payer: Medicare Other | Admitting: Oncology

## 2011-04-11 ENCOUNTER — Telehealth: Payer: Self-pay | Admitting: Oncology

## 2011-04-11 NOTE — Telephone Encounter (Signed)
pt had called and l/m to rt due to weather,r/s to 1/28   aom

## 2011-04-21 ENCOUNTER — Other Ambulatory Visit: Payer: Medicare Other | Admitting: Lab

## 2011-04-21 ENCOUNTER — Ambulatory Visit (HOSPITAL_BASED_OUTPATIENT_CLINIC_OR_DEPARTMENT_OTHER): Payer: Medicare Other | Admitting: Oncology

## 2011-04-21 ENCOUNTER — Encounter: Payer: Self-pay | Admitting: Oncology

## 2011-04-21 VITALS — BP 136/99 | HR 86 | Temp 97.2°F | Ht 62.0 in | Wt 190.0 lb

## 2011-04-21 DIAGNOSIS — C50912 Malignant neoplasm of unspecified site of left female breast: Secondary | ICD-10-CM

## 2011-04-21 DIAGNOSIS — Z853 Personal history of malignant neoplasm of breast: Secondary | ICD-10-CM

## 2011-04-21 DIAGNOSIS — C50412 Malignant neoplasm of upper-outer quadrant of left female breast: Secondary | ICD-10-CM | POA: Insufficient documentation

## 2011-04-21 LAB — CBC WITH DIFFERENTIAL/PLATELET
Basophils Absolute: 0 10*3/uL (ref 0.0–0.1)
EOS%: 1.6 % (ref 0.0–7.0)
Eosinophils Absolute: 0.1 10*3/uL (ref 0.0–0.5)
HCT: 40.5 % (ref 34.8–46.6)
HGB: 13.8 g/dL (ref 11.6–15.9)
MCH: 31.3 pg (ref 25.1–34.0)
MCV: 91.7 fL (ref 79.5–101.0)
NEUT#: 3.4 10*3/uL (ref 1.5–6.5)
NEUT%: 59.9 % (ref 38.4–76.8)
RDW: 12.8 % (ref 11.2–14.5)
lymph#: 1.8 10*3/uL (ref 0.9–3.3)

## 2011-04-21 LAB — COMPREHENSIVE METABOLIC PANEL
Albumin: 3.8 g/dL (ref 3.5–5.2)
BUN: 20 mg/dL (ref 6–23)
CO2: 24 mEq/L (ref 19–32)
Calcium: 9.1 mg/dL (ref 8.4–10.5)
Chloride: 101 mEq/L (ref 96–112)
Glucose, Bld: 326 mg/dL — ABNORMAL HIGH (ref 70–99)
Potassium: 4.1 mEq/L (ref 3.5–5.3)
Sodium: 136 mEq/L (ref 135–145)
Total Protein: 6.6 g/dL (ref 6.0–8.3)

## 2011-04-21 NOTE — Progress Notes (Signed)
This office note has been dictated.  #[id number]

## 2011-04-22 ENCOUNTER — Telehealth: Payer: Self-pay | Admitting: Medical Oncology

## 2011-04-22 ENCOUNTER — Other Ambulatory Visit: Payer: Self-pay | Admitting: Oncology

## 2011-04-22 NOTE — Progress Notes (Signed)
CC:   Jillian Willis, M.D. Jillian Willis, Ph.D., M.D. Jillian Willis., M.D. [facility]  PROBLEM LIST: 1. Cancer of the left breast dating back to November 2000, with 2     synchronous small undifferentiated cancers located in the upper     inner quadrant of the left breast.  Both tumors were hormone     receptor negative with high proliferative fractions.  One of the     tumors was HER-2 three plus positive.  There was a small 3 mm focus     of tumor in 1 of the sentinel lymph nodes.  The remaining 13 lymph     nodes were negative.  Following a local excision Jillian Willis     completed 4 cycles of adjuvant chemotherapy with Cytoxan and     Adriamycin.  She then underwent radiation treatments to the left     breast, which were completed in June of 2001.  The patient has     remained disease-free since that time without recurrence. 2. 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 3. Reductive mammoplasty on the right breast on 03/21/2004 by Dr.     Pleas Patricia. 4. Morbid obesity. 5. Hypertension. 6. Diabetes mellitus type 2. 7. GERD. 8. History of hypothyroidism. 9. History of depression. 10.Dyslipidemia. 11.Allergies to ragweed, mold, dust, and trees. 12.Stress and urge incontinence.  MEDICATIONS: 1. Aspirin 81 mg daily. 2. Lexapro 10 mg as needed. 3. Fenofibrate 145 mg daily. 4. Glucotrol 5 mg twice a day. 5. Synthroid 200 mcg daily. 6. Victoza 1.2 units subcu daily. 7. Nitroglycerin 0.4 mg sublingually as needed. 8. Crestor 10 mg daily. 9. VESIcare 10 mg daily.  HISTORY:  Jillian Willis is here today with her husband, Jillian Willis.  She was last seen by [place] on 08/29/2010.  She has done well since the completion of her treatments  in June of 2001.  Her breast cancer goes back to November 2000.  She has remained continuously disease free.  Jillian Willis is actively losing weight.  She has done extremely well with dieting.  Her weight at 1 time was up to  280 pounds going back to 2006.  The patient has been successful been losing about 90 pounds since that time.  She is concerned today about some soft tissue nodules.  Apparently she pointed these out to Jillian Willis on her last visit here on 08/28/2009.  Aside from this, the patient has no symptoms to suggest recurrent breast cancer.  Her last mammogram was on 01/31/2011 and was negative.  PHYSICAL EXAMINATION:  The patient is still somewhat obese.  Her weight today is 190 pounds, height 5 feet 2 inches, body surface area 1.94 sq m.  On 08/29/2010 her weight was 198 pounds.  Blood pressure today 136/99.  Other vital signs are normal.  There is no scleral icterus. Mouth and pharynx are benign.  There is no peripheral adenopathy palpable.  Heart and lungs are normal.  The patient is undergone a right- sided reductive mammoplasty.  There is very good symmetry between the 2 breasts.  Both breasts are fairly soft.  Left breast has  undergone surgery and radiation treatments.  The erythema that she used to have in the central part of the breast above the nipple areolar complex has faded, so has the induration.  There are no suspicious findings.  Both nipples are flat.  The patient apparently had some surgery on her left nipple-areolar complex when she was a child.  As stated, there were no suspicious findings.  The patient pointed out to me the nodularity in her soft tissues over the left lower rib cage.  I believe these are adipose nodules.  They are somewhat tender.  I do not think they qualify as actual lipomas but I certainly can feel the nodularity in the soft tissues, and this is not present to any degree on the right side, so there is asymmetry.  There were perhaps 2-4 nodular areas.  Abdomen is obese, nontender with no organomegaly or masses palpable.  Extremities no peripheral edema or clubbing.  The patient does have swelling of her left upper arm.  There is no swelling of the left  lower arm.  Neurologic exam is grossly normal.  LABORATORY DATA:  Today, white count 5.7, ANC 3.4, hemoglobin 13.8, hematocrit 40.5, platelets a 198,000.  Chemistries today are pending. Chemistries from 08/29/2010  notable for a glucose of 541, and a sodium of 134, AST of 38.  BUN was 16, creatinine 1.09, and albumin was 4.0.  IMAGING STUDIES: 1. CT scan of the abdomen with IV contrast on 08/10/2009 was negative. 2. Digital screening mammogram on 01/28/2010 was negative. 3. Chest x-ray, 2 views, from 02/19/2010 showed no active disease. 4. Bilateral screening mammograms from 01/31/2011 were negative.  IMPRESSION AND PLAN:  Jillian Willis is now out over 12 years from the time of diagnosis without evidence of recurrent disease.  The patient was reassured about her concerns regarding the soft tissue lumps near her left lower rib cage.  These feel benign.  We plan to see Jillian Willis again in 1 year at which time we will check CBC and chemistries.  She will be due for her yearly mammogram in November.    ______________________________ Samul Dada, M.D. DSM/MEDQ  D:  04/21/2011  T:  04/21/2011  Job:  [id number]

## 2011-04-22 NOTE — Telephone Encounter (Signed)
Pt called back and was concerned that about her glucose was 326 yesterday. She states that yesterday morning it was 132. I asked if she had eaten lunch and she said yes. I told her we just wanted her to be aware and we will fax to Dr. Juleen China and she asked if we will mail her a copy.

## 2011-04-22 NOTE — Telephone Encounter (Signed)
I called pt and left her a message that her blood glucose was 326. I asked her to call me back with to get her primary care MD's name so we can fax these orders.

## 2011-05-16 ENCOUNTER — Encounter: Payer: Self-pay | Admitting: Nurse Practitioner

## 2011-05-16 ENCOUNTER — Encounter: Payer: Self-pay | Admitting: *Deleted

## 2011-05-16 ENCOUNTER — Ambulatory Visit (INDEPENDENT_AMBULATORY_CARE_PROVIDER_SITE_OTHER): Payer: Medicare Other | Admitting: Nurse Practitioner

## 2011-05-16 VITALS — BP 128/90 | HR 67 | Ht 62.0 in | Wt 185.0 lb

## 2011-05-16 DIAGNOSIS — I2510 Atherosclerotic heart disease of native coronary artery without angina pectoris: Secondary | ICD-10-CM | POA: Diagnosis not present

## 2011-05-16 MED ORDER — NITROGLYCERIN 0.4 MG SL SUBL: 0.4000 mg | SUBLINGUAL_TABLET | SUBLINGUAL | Status: DC | PRN

## 2011-05-16 NOTE — Assessment & Plan Note (Signed)
She is doing very well. Her last stress test was in 2012 and felt to be low risk. She has no symptoms and is actually doing and feeling better with her continued weight loss. We will see her back in one year. Will probably repeat her myoview on return. She has her labs with her PCP. Her NTG is refilled per her request today. Patient is agreeable to this plan and will call if any problems develop in the interim.

## 2011-05-16 NOTE — Patient Instructions (Addendum)
I think you are doing well.   Stay on your current medicines.  We will see you back in a year. You will see Dr. Jens Som at that time.  Call the [place] office at [phone] if you have any questions, problems or concerns.

## 2011-05-16 NOTE — Progress Notes (Signed)
Jillian Willis Date of Birth: 1940/10/21 Medical Record #[id number]  History of Present Illness: Jillian Willis is seen today for her one year check. She is seen for Dr. Jens Som. She is a former patient of Dr. Ronnald Nian. She has known CAD with remote cath in 2009 showing a heavily calcified left main coronary artery with mild to moderate diffuse atherosclerosis. She has had remote angioplasty of the mid LAD back in 2004. Her last stress test was in 2012 and was felt to be low risk. There was an old anteroapical scar with minimal reversible ischemia. It was unchanged from her study of 2009. Her other issues include obesity, DM, HTN, hyperlipidemia and depression.   She comes in today. She is here with her husband. She is doing ok. No chest pain. Her breathing has improved with her weight loss. She has more energy. She is quite happy with how she is doing. She attributes her weight loss to cutting back on portions and taking Victoza. She has her labs with her PCP.   Current Outpatient Prescriptions on File Prior to Visit  Medication Sig Dispense Refill  . aspirin 81 MG tablet Take 81 mg by mouth daily.       Marland Kitchen escitalopram (LEXAPRO) 10 MG tablet Take 10 mg by mouth as needed.      . fenofibrate (TRICOR) 145 MG tablet Take 145 mg by mouth daily.      Marland Kitchen glipiZIDE (GLUCOTROL) 5 MG tablet Take 5 mg by mouth 2 (two) times daily before a meal.      . levothyroxine (SYNTHROID, LEVOTHROID) 200 MCG tablet Take 200 mcg by mouth daily.      . Liraglutide (VICTOZA [place]) Inject 1.2 Units into the skin.       Marland Kitchen nitroGLYCERIN (NITROSTAT) 0.4 MG SL tablet Place 0.4 mg under the tongue every 5 (five) minutes as needed.      . rosuvastatin (CRESTOR) 10 MG tablet Take 10 mg by mouth daily.      . solifenacin (VESICARE) 5 MG tablet Take 10 mg by mouth daily.        Allergies  Allergen Reactions  . Codeine     Past Medical History  Diagnosis Date  . Hyperlipidemia   . Hypertension   . Diabetes mellitus   .  Obesity   . Pulmonary hypertension   . IHD (ischemic heart disease)   . Depression   . GERD (gastroesophageal reflux disease)   . Hypothyroidism     Past Surgical History  Procedure Date  . Cardiac catheterization 06/29/2007    EF 50-55%  . Cardiac catheterization 11/14/2002    EF 55-60%  . Cardiac catheterization 05/09/2002    EF 45%  . Cardiovascular stress test 05/23/10    EF 68%, NO ISCHEMIA    History  Smoking status  . Former Smoker  . Quit date: 03/24/1960  Smokeless tobacco  . Not on file    History  Alcohol Use No    Family History  Problem Relation Age of Onset  . Heart attack Mother   . Heart disease Father   . Stroke Father     Review of Systems: The review of systems is per the HPI.  She continues to actively lose weight. Has occasional dizziness. No syncope.  All other systems were reviewed and are negative.  Physical Exam: Ht 5\' 2"  (1.575 m)  Wt 185 lb (83.915 kg)  BMI 33.84 kg/m2 Weight is down to 185 from 237 at her last visit in  2012. Patient is very pleasant and in no acute distress. Skin is warm and dry. Color is normal.  HEENT is unremarkable. Normocephalic/atraumatic. PERRL. Sclera are nonicteric. Neck is supple. No masses. No JVD. Lungs are clear. Cardiac exam shows a regular rate and rhythm. Abdomen is obese but soft. Extremities are without edema. Gait and ROM are intact. No gross neurologic deficits noted.   LABORATORY DATA: EKG today shows sinus rhythm with nonspecific ST/T wave changes. Tracing is unchanged.   Assessment / Plan:

## 2011-05-26 DIAGNOSIS — R1312 Dysphagia, oropharyngeal phase: Secondary | ICD-10-CM | POA: Diagnosis not present

## 2011-05-26 DIAGNOSIS — H903 Sensorineural hearing loss, bilateral: Secondary | ICD-10-CM | POA: Diagnosis not present

## 2011-05-27 ENCOUNTER — Other Ambulatory Visit (HOSPITAL_COMMUNITY): Payer: Self-pay | Admitting: Endocrinology

## 2011-06-02 DIAGNOSIS — E119 Type 2 diabetes mellitus without complications: Secondary | ICD-10-CM | POA: Diagnosis not present

## 2011-06-03 ENCOUNTER — Ambulatory Visit (HOSPITAL_COMMUNITY)
Admission: RE | Admit: 2011-06-03 | Discharge: 2011-06-03 | Disposition: A | Discharge status: Home / Self Care | Payer: Medicare Other | Source: Ambulatory Visit | Attending: Endocrinology | Admitting: Endocrinology

## 2011-06-03 ENCOUNTER — Ambulatory Visit (HOSPITAL_COMMUNITY)
Admission: RE | Admit: 2011-06-03 | Discharge: 2011-06-03 | Disposition: A | Discharge status: Home / Self Care | Payer: Medicare Other | Source: Ambulatory Visit | Attending: Otolaryngology | Admitting: Otolaryngology

## 2011-06-03 DIAGNOSIS — E119 Type 2 diabetes mellitus without complications: Secondary | ICD-10-CM | POA: Insufficient documentation

## 2011-06-03 DIAGNOSIS — K219 Gastro-esophageal reflux disease without esophagitis: Secondary | ICD-10-CM | POA: Diagnosis not present

## 2011-06-03 DIAGNOSIS — E039 Hypothyroidism, unspecified: Secondary | ICD-10-CM | POA: Insufficient documentation

## 2011-06-03 DIAGNOSIS — R1310 Dysphagia, unspecified: Secondary | ICD-10-CM | POA: Insufficient documentation

## 2011-06-03 DIAGNOSIS — E785 Hyperlipidemia, unspecified: Secondary | ICD-10-CM | POA: Insufficient documentation

## 2011-06-03 DIAGNOSIS — I10 Essential (primary) hypertension: Secondary | ICD-10-CM | POA: Insufficient documentation

## 2011-06-03 NOTE — Procedures (Signed)
Modified Barium Swallow Procedure Note Patient Details  Name: Jillian Willis MRN: [id number] Date of Birth: [birth year]  Today's Date: 06/03/2011 Time:0940  - 1031    Past Medical History:  Past Medical History  Diagnosis Date  . Hyperlipidemia   . Hypertension   . Diabetes mellitus   . Obesity   . 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 Remote angioplasty of hte mid LAD in 2004.  Last myoview in 2012 felt to be low risk.   . Depression   . GERD (gastroesophageal reflux disease)   . Hypothyroidism    Past Surgical History:  Past Surgical History  Procedure Date  . Cardiac catheterization 06/29/2007    EF 50-55%  . Cardiac catheterization 11/14/2002    EF 55-60%  . Cardiac catheterization 05/09/2002    EF 45%  . Cardiovascular stress test 05/23/10    EF 68%, NO ISCHEMIA   HPI:  Pt is a 71 yo female referred by Dr Jillian Willis for MBS due pt complaint of dysphagia.  Pt PMH + for GERD, HTN, hyperlipidemia, pulmonary HTN, IHD, DM, obesity, hypothyroidism (with goiter per pt), depression, breast cancer s/p lumpectomy, chemoradiation in 2000 and pt reports polio as a child (required iron lung tx).  Pt reports she never smoked.  Problems swallowing intermittently present x years with increased frequency and intensity recently.  Pt acknowledges taking Nexium in the past for 2-3 years, been off Nexium over the last 4-5 years.  Pt denies symptoms of reflux.  She states food and drink feel like they won't go down pointing to her pharynx and she at times will choke on her saliva.  Pt does report liquid consumption can help to clear.  Pt denies recent pnas, acknowledges intentional weight loss.  Pt reports that Dr Jillian Willis conducted laryngoscopy with no abnormal findings indicated.        Recommendation/Prognosis  Clinical Impression Dysphagia Diagnosis: Within Functional Limits;Suspected primary esophageal dysphagia Clinical  impression: Pt presents with a functional oropharyngeal swallow ability, swallow was timely and of adequate strength without aspiration or penetration.  Pt tested with thin, nectar, pudding, cracker and barium tablet.  Pt did appear with stasis in her esophagus without consistent sensation, at times with referent sensation to pharynx.   Liquids appeared to faciliate clearance of proximal to distal esophageal stasis of pudding/cracker but with appearance of retrograde propulsion of liquid, suspect consistent with motility issues.  Radiologist was not present to confirm findings.  Barium tablet taken with thin readily cleared pharynx and esophagus without delay.   SLP advised pt of esophageal precautions including consuming plenty of liquids with meals and sitting upright for 30 minutes after po intake.  Given h/o GERD and c/o xerostomia, SLP provided written reflux precautions and xerotomia tips as well.  Pt may benefit from MD considering dedicated esophageal evaluation.  Thanks for this referral.     Swallow Evaluation Recommendations Recommended Consults: Consider esophageal assessment Solid Consistency: Regular Liquid Consistency: Thin Medication Administration: Whole meds with liquid Supervision: Patient able to self feed Compensations: Slow rate;Small sips/bites;Follow solids with liquid Postural Changes and/or Swallow Maneuvers: Seated upright 90 degrees;Upright 30-60 min after meal Oral Care Recommendations: Oral care BID   Individuals Consulted Consulted and Agree with Results and Recommendations: Patient  General:  Date of Onset: 06/03/11 HPI: Pt is a 71 yo female referred by Dr Jillian Willis for MBS due pt complaint of dysphagia.  Pt  PMH + for GERD, HTN, hyperlipidemia, pulmonary HTN, IHD, DM, obesity, hypothyroidism (with goiter per pt), depression, breast cancer s/p lumpectomy, chemoradiation in 2000 and pt reports polio as a child (required iron lung tx).  Pt reports she never smoked.  Problems  swallowing intermittently present x years with increased frequency and intensity recently.  Pt acknowledges taking Nexium in the past for 2-3 years, been off Nexium over the last 4-5 years.  Pt denies symptoms of reflux.  She states food and drink feel like they won't go down pointing to her pharynx and she at times will choke on her saliva.  Pt does report liquid consumption can help to clear.  Pt denies recent pnas, acknowledges intentional weight loss.  Pt reports that Dr Jillian Willis conducted laryngoscopy with no abnormal findings indicated.    Type of Study: Initial MBS Diet Prior to this Study: Regular Respiratory Status: Room air History of Intubation: No Behavior/Cognition: Alert Oral Cavity - Dentition: Dentures, bottom;Dentures, top Vision: Functional for self-feeding Patient Positioning: Upright in bed Baseline Vocal Quality: Clear Volitional Cough: Strong Volitional Swallow: Able to elicit Anatomy: Within functional limits Pharyngeal Secretions: Not observed secondary MBS  Reason for Referral:    Oral Phase Oral Preparation/Oral Phase Oral Phase: WFL Pharyngeal Phase  Pharyngeal Phase Pharyngeal Phase: Within functional limits Pharyngeal Phase - Comment Pharyngeal Comment: Trace stasis at vallecular space with pudding and cracker that pt sensed-functional for pt. Cervical Esophageal Phase  Cervical Esophageal Phase Cervical Esophageal Phase: Impaired Cervical Esophageal Phase - Comment Cervical Esophageal Comment: Appearance of intermittently viewed prominent cricopharyngeus, did not impair barium flow.  Radiologist not present to confirm.  Jillian Burnet, MS [facility] SLP [phone]

## 2011-06-11 DIAGNOSIS — E119 Type 2 diabetes mellitus without complications: Secondary | ICD-10-CM | POA: Diagnosis not present

## 2011-06-11 DIAGNOSIS — J209 Acute bronchitis, unspecified: Secondary | ICD-10-CM | POA: Diagnosis not present

## 2011-06-11 DIAGNOSIS — E789 Disorder of lipoprotein metabolism, unspecified: Secondary | ICD-10-CM | POA: Diagnosis not present

## 2011-06-11 DIAGNOSIS — R490 Dysphonia: Secondary | ICD-10-CM | POA: Diagnosis not present

## 2011-06-16 ENCOUNTER — Ambulatory Visit (INDEPENDENT_AMBULATORY_CARE_PROVIDER_SITE_OTHER): Payer: Self-pay | Admitting: General Surgery

## 2011-07-14 DIAGNOSIS — R197 Diarrhea, unspecified: Secondary | ICD-10-CM | POA: Diagnosis not present

## 2011-07-14 DIAGNOSIS — R112 Nausea with vomiting, unspecified: Secondary | ICD-10-CM | POA: Diagnosis not present

## 2011-07-14 DIAGNOSIS — R5381 Other malaise: Secondary | ICD-10-CM | POA: Diagnosis not present

## 2011-07-14 DIAGNOSIS — R5383 Other fatigue: Secondary | ICD-10-CM | POA: Diagnosis not present

## 2011-08-01 ENCOUNTER — Ambulatory Visit (INDEPENDENT_AMBULATORY_CARE_PROVIDER_SITE_OTHER): Payer: Medicare Other | Admitting: General Surgery

## 2011-08-01 ENCOUNTER — Encounter (INDEPENDENT_AMBULATORY_CARE_PROVIDER_SITE_OTHER): Payer: Self-pay | Admitting: General Surgery

## 2011-08-01 VITALS — BP 132/96 | HR 76 | Temp 98.0°F | Resp 16 | Ht 62.5 in | Wt 183.6 lb

## 2011-08-01 DIAGNOSIS — C50919 Malignant neoplasm of unspecified site of unspecified female breast: Secondary | ICD-10-CM

## 2011-08-01 DIAGNOSIS — C50912 Malignant neoplasm of unspecified site of left female breast: Secondary | ICD-10-CM

## 2011-08-01 NOTE — Patient Instructions (Signed)
Your breast exam is normal. Your recent mammograms are normal. There is no evidence of breast cancer.  Your next mammogram should be performed in September of 2013.  Keep your appointments with Dr. Macon Large and no rales or  Return to see Dr. Derrell Lolling in one year.

## 2011-08-01 NOTE — Progress Notes (Signed)
Patient ID: Jillian Willis, female   DOB: 1941/02/12, 71 y.o.   MRN: [id number]  Chief Complaint  Patient presents with  . Breast Cancer Long Term Follow Up    Left Breast    HPI Jillian Willis is a 71 y.o. female.  She returns for an annual breast cancer check.  This patient underwent left partial mastectomy, axillary lymph node dissection by Francina Ames in November 2000. The cancer was a stage T1c., N1a., 1/13 nodes positive, receptor negative. She had a port placed, chemotherapy given, and the port removed. Dr. Stephens November performed a right breast reduction. She has been disease free ever since.  She sees Dr. Arline Asp periodically. Her primary care physician is Dr. Dola Factor.  Last mammogram was December 20, 2010. Breasts are fatty. Category 2, no focal abnormality.  Her comorbidities include hyperlipidemia, hypothyroidism, diabetes, anxiety, osteoporosis.  She feels fine. Her health problems have been stable. None of her medications have changed in the last year. No breast complaints. HPI  Past Medical History  Diagnosis Date  . Hyperlipidemia   . Hypertension   . Diabetes mellitus   . Obesity   . 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 Remote angioplasty of hte mid LAD in 2004.  Last myoview in 2012 felt to be low risk.   . Depression   . GERD (gastroesophageal reflux disease)   . Hypothyroidism     Past Surgical History  Procedure Date  . Cardiac catheterization 06/29/2007    EF 50-55%  . Cardiac catheterization 11/14/2002    EF 55-60%  . Cardiac catheterization 05/09/2002    EF 45%  . Cardiovascular stress test 05/23/10    EF 68%, NO ISCHEMIA  . Breast surgery 2000    lympectomy on Left Br  . Breast surgery 2005    Right Br Reduction    Family History  Problem Relation Age of Onset  . Heart attack Mother   . Cancer Mother     Breast  . Heart disease Father   . Stroke Father      Social History History  Substance Use Topics  . Smoking status: Never Smoker   . Smokeless tobacco: Not on file  . Alcohol Use: No    Allergies  Allergen Reactions  . Codeine     Current Outpatient Prescriptions  Medication Sig Dispense Refill  . ACCU-CHEK SMARTVIEW test strip       . aspirin 81 MG tablet Take 81 mg by mouth daily.       Marland Kitchen escitalopram (LEXAPRO) 10 MG tablet Take 10 mg by mouth as needed.      . fenofibrate (TRICOR) 145 MG tablet Take 145 mg by mouth daily.      Marland Kitchen glipiZIDE (GLUCOTROL) 5 MG tablet Take 5 mg by mouth 2 (two) times daily before a meal.      . LANTUS SOLOSTAR 100 UNIT/ML injection       . levothyroxine (SYNTHROID, LEVOTHROID) 200 MCG tablet Take 200 mcg by mouth daily.      . Liraglutide (VICTOZA [place]) Inject 1.2 Units into the skin.       . metFORMIN (GLUCOPHAGE-XR) 500 MG 24 hr tablet       . nitroGLYCERIN (NITROSTAT) 0.4 MG SL tablet Place 1 tablet (0.4 mg total) under the tongue every 5 (five) minutes as needed.  25 tablet  6  . NOVOFINE 32G X 6  MM MISC       . rosuvastatin (CRESTOR) 10 MG tablet Take 10 mg by mouth daily.      . solifenacin (VESICARE) 5 MG tablet Take 10 mg by mouth daily.      . TRILIPIX 135 MG capsule         Review of Systems Review of Systems  Constitutional: Negative for fever, chills and unexpected weight change.  HENT: Negative for hearing loss, congestion, sore throat, trouble swallowing and voice change.   Eyes: Negative for visual disturbance.  Respiratory: Negative for cough and wheezing.   Cardiovascular: Negative for chest pain, palpitations and leg swelling.  Gastrointestinal: Negative for nausea, vomiting, abdominal pain, diarrhea, constipation, blood in stool, abdominal distention and anal bleeding.  Genitourinary: Negative for hematuria, vaginal bleeding and difficulty urinating.  Musculoskeletal: Negative for arthralgias.  Skin: Negative for rash and wound.  Neurological: Negative for seizures,  syncope and headaches.  Hematological: Negative for adenopathy. Does not bruise/bleed easily.  Psychiatric/Behavioral: Negative for confusion.    Blood pressure 132/96, pulse 76, temperature 98 F (36.7 C), temperature source Temporal, resp. rate 16, height 5' 2.5" (1.588 m), weight 183 lb 9.6 oz (83.28 kg).  Physical Exam Physical Exam  Constitutional: She is oriented to person, place, and time. She appears well-developed and well-nourished. No distress.  HENT:  Head: Normocephalic and atraumatic.  Nose: Nose normal.  Mouth/Throat: No oropharyngeal exudate.  Eyes: Conjunctivae and EOM are normal. Pupils are equal, round, and reactive to light. Left eye exhibits no discharge. No scleral icterus.  Neck: Neck supple. No JVD present. No tracheal deviation present. No thyromegaly present.  Cardiovascular: Normal rate, regular rhythm, normal heart sounds and intact distal pulses.   No murmur heard. Pulmonary/Chest: Effort normal and breath sounds normal. No respiratory distress. She has no wheezes. She has no rales. She exhibits no tenderness.         Multiple scars both breasts. No focal mass. No other skin changes. No axillary adenopathy.  Abdominal: Soft. Bowel sounds are normal. She exhibits no distension and no mass. There is no tenderness. There is no rebound and no guarding.       Right subcostal scar.  Musculoskeletal: She exhibits no edema and no tenderness.  Lymphadenopathy:    She has no cervical adenopathy.  Neurological: She is alert and oriented to person, place, and time. She exhibits normal muscle tone. Coordination normal.  Skin: Skin is warm. No rash noted. She is not diaphoretic. No erythema. No pallor.  Psychiatric: She has a normal mood and affect. Her behavior is normal. Judgment and thought content normal.    Data Reviewed I reviewed all of her old records in our chart. I have reviewed her mammograms from September.  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  No evidence of recurrence 13 years following left partial mastectomy and axillary lymph node dissection.  Diabetes mellitus type 2  Hypothyroidism  Osteoporosis  Hyperlipidemia      Plan    Schedule annual mammograms in September 2013.  Continue regular followup with Dr. Arline Asp and Dr. Juleen China  Return to see me in one year.       Angelia Mould. Derrell Lolling, M.D., [facility] General and Minimally invasive Surgery Breast and Colorectal Surgery Office:   [phone] Pager:   [phone]  08/01/2011, 9:50 AM

## 2011-08-06 DIAGNOSIS — E789 Disorder of lipoprotein metabolism, unspecified: Secondary | ICD-10-CM | POA: Diagnosis not present

## 2011-08-06 DIAGNOSIS — E119 Type 2 diabetes mellitus without complications: Secondary | ICD-10-CM | POA: Diagnosis not present

## 2011-08-13 DIAGNOSIS — E119 Type 2 diabetes mellitus without complications: Secondary | ICD-10-CM | POA: Diagnosis not present

## 2011-08-13 DIAGNOSIS — E789 Disorder of lipoprotein metabolism, unspecified: Secondary | ICD-10-CM | POA: Diagnosis not present

## 2011-08-13 DIAGNOSIS — E0789 Other specified disorders of thyroid: Secondary | ICD-10-CM | POA: Diagnosis not present

## 2011-08-13 DIAGNOSIS — I10 Essential (primary) hypertension: Secondary | ICD-10-CM | POA: Diagnosis not present

## 2011-08-13 DIAGNOSIS — N390 Urinary tract infection, site not specified: Secondary | ICD-10-CM | POA: Diagnosis not present

## 2011-08-20 DIAGNOSIS — N393 Stress incontinence (female) (male): Secondary | ICD-10-CM | POA: Diagnosis not present

## 2011-08-20 DIAGNOSIS — N3020 Other chronic cystitis without hematuria: Secondary | ICD-10-CM | POA: Diagnosis not present

## 2011-09-08 DIAGNOSIS — E789 Disorder of lipoprotein metabolism, unspecified: Secondary | ICD-10-CM | POA: Diagnosis not present

## 2011-09-08 DIAGNOSIS — E0789 Other specified disorders of thyroid: Secondary | ICD-10-CM | POA: Diagnosis not present

## 2011-09-15 DIAGNOSIS — I10 Essential (primary) hypertension: Secondary | ICD-10-CM | POA: Diagnosis not present

## 2011-09-15 DIAGNOSIS — E0789 Other specified disorders of thyroid: Secondary | ICD-10-CM | POA: Diagnosis not present

## 2011-09-15 DIAGNOSIS — N390 Urinary tract infection, site not specified: Secondary | ICD-10-CM | POA: Diagnosis not present

## 2011-09-15 DIAGNOSIS — R82998 Other abnormal findings in urine: Secondary | ICD-10-CM | POA: Diagnosis not present

## 2011-09-15 DIAGNOSIS — R0609 Other forms of dyspnea: Secondary | ICD-10-CM | POA: Diagnosis not present

## 2011-09-15 DIAGNOSIS — E789 Disorder of lipoprotein metabolism, unspecified: Secondary | ICD-10-CM | POA: Diagnosis not present

## 2011-09-29 DIAGNOSIS — Z86010 Personal history of colonic polyps: Secondary | ICD-10-CM | POA: Diagnosis not present

## 2011-09-29 DIAGNOSIS — C50919 Malignant neoplasm of unspecified site of unspecified female breast: Secondary | ICD-10-CM | POA: Diagnosis not present

## 2011-10-07 DIAGNOSIS — D126 Benign neoplasm of colon, unspecified: Secondary | ICD-10-CM | POA: Diagnosis not present

## 2011-10-07 DIAGNOSIS — Z86010 Personal history of colonic polyps: Secondary | ICD-10-CM | POA: Diagnosis not present

## 2011-10-07 DIAGNOSIS — K621 Rectal polyp: Secondary | ICD-10-CM | POA: Diagnosis not present

## 2011-10-07 DIAGNOSIS — K620 Anal polyp: Secondary | ICD-10-CM | POA: Diagnosis not present

## 2011-10-07 DIAGNOSIS — K649 Unspecified hemorrhoids: Secondary | ICD-10-CM | POA: Diagnosis not present

## 2011-11-28 DIAGNOSIS — E789 Disorder of lipoprotein metabolism, unspecified: Secondary | ICD-10-CM | POA: Diagnosis not present

## 2011-11-28 DIAGNOSIS — E0789 Other specified disorders of thyroid: Secondary | ICD-10-CM | POA: Diagnosis not present

## 2011-11-28 DIAGNOSIS — E119 Type 2 diabetes mellitus without complications: Secondary | ICD-10-CM | POA: Diagnosis not present

## 2011-12-01 DIAGNOSIS — I10 Essential (primary) hypertension: Secondary | ICD-10-CM | POA: Diagnosis not present

## 2011-12-01 DIAGNOSIS — R109 Unspecified abdominal pain: Secondary | ICD-10-CM | POA: Diagnosis not present

## 2011-12-01 DIAGNOSIS — E119 Type 2 diabetes mellitus without complications: Secondary | ICD-10-CM | POA: Diagnosis not present

## 2011-12-01 DIAGNOSIS — E789 Disorder of lipoprotein metabolism, unspecified: Secondary | ICD-10-CM | POA: Diagnosis not present

## 2011-12-05 ENCOUNTER — Other Ambulatory Visit: Payer: Self-pay | Admitting: Endocrinology

## 2011-12-05 DIAGNOSIS — R1011 Right upper quadrant pain: Secondary | ICD-10-CM

## 2011-12-09 ENCOUNTER — Ambulatory Visit
Admission: RE | Admit: 2011-12-09 | Discharge: 2011-12-09 | Disposition: A | Discharge status: Home / Self Care | Payer: Medicare Other | Source: Ambulatory Visit | Attending: Endocrinology | Admitting: Endocrinology

## 2011-12-09 DIAGNOSIS — R1011 Right upper quadrant pain: Secondary | ICD-10-CM | POA: Diagnosis not present

## 2011-12-09 MED ORDER — IOHEXOL 300 MG/ML  SOLN
100.0000 mL | Freq: Once | INTRAMUSCULAR | Status: AC | PRN
  Administered 2011-12-09: 100 mL via INTRAVENOUS

## 2011-12-17 DIAGNOSIS — Z23 Encounter for immunization: Secondary | ICD-10-CM | POA: Diagnosis not present

## 2011-12-29 ENCOUNTER — Other Ambulatory Visit: Payer: Self-pay | Admitting: Oncology

## 2011-12-29 DIAGNOSIS — Z853 Personal history of malignant neoplasm of breast: Secondary | ICD-10-CM

## 2011-12-29 DIAGNOSIS — Z9889 Other specified postprocedural states: Secondary | ICD-10-CM

## 2011-12-29 DIAGNOSIS — Z1231 Encounter for screening mammogram for malignant neoplasm of breast: Secondary | ICD-10-CM

## 2011-12-30 DIAGNOSIS — Z124 Encounter for screening for malignant neoplasm of cervix: Secondary | ICD-10-CM | POA: Diagnosis not present

## 2011-12-30 DIAGNOSIS — Z01419 Encounter for gynecological examination (general) (routine) without abnormal findings: Secondary | ICD-10-CM | POA: Diagnosis not present

## 2012-01-15 DIAGNOSIS — N949 Unspecified condition associated with female genital organs and menstrual cycle: Secondary | ICD-10-CM | POA: Diagnosis not present

## 2012-02-02 DIAGNOSIS — E119 Type 2 diabetes mellitus without complications: Secondary | ICD-10-CM | POA: Diagnosis not present

## 2012-02-02 DIAGNOSIS — R05 Cough: Secondary | ICD-10-CM | POA: Diagnosis not present

## 2012-02-11 ENCOUNTER — Ambulatory Visit
Admission: RE | Admit: 2012-02-11 | Discharge: 2012-02-11 | Disposition: A | Discharge status: Home / Self Care | Payer: Medicare Other | Source: Ambulatory Visit | Attending: Oncology | Admitting: Oncology

## 2012-02-11 DIAGNOSIS — Z9889 Other specified postprocedural states: Secondary | ICD-10-CM

## 2012-02-11 DIAGNOSIS — Z853 Personal history of malignant neoplasm of breast: Secondary | ICD-10-CM

## 2012-02-11 DIAGNOSIS — Z1231 Encounter for screening mammogram for malignant neoplasm of breast: Secondary | ICD-10-CM | POA: Diagnosis not present

## 2012-03-01 DIAGNOSIS — E119 Type 2 diabetes mellitus without complications: Secondary | ICD-10-CM | POA: Diagnosis not present

## 2012-03-05 DIAGNOSIS — E789 Disorder of lipoprotein metabolism, unspecified: Secondary | ICD-10-CM | POA: Diagnosis not present

## 2012-03-05 DIAGNOSIS — I10 Essential (primary) hypertension: Secondary | ICD-10-CM | POA: Diagnosis not present

## 2012-03-05 DIAGNOSIS — E119 Type 2 diabetes mellitus without complications: Secondary | ICD-10-CM | POA: Diagnosis not present

## 2012-04-22 ENCOUNTER — Telehealth: Payer: Self-pay | Admitting: Oncology

## 2012-04-22 ENCOUNTER — Ambulatory Visit: Payer: Medicare Other | Admitting: Oncology

## 2012-04-22 ENCOUNTER — Encounter: Payer: Self-pay | Admitting: Physician Assistant

## 2012-04-22 ENCOUNTER — Other Ambulatory Visit (HOSPITAL_BASED_OUTPATIENT_CLINIC_OR_DEPARTMENT_OTHER): Payer: Medicare Other | Admitting: Lab

## 2012-04-22 ENCOUNTER — Ambulatory Visit (HOSPITAL_BASED_OUTPATIENT_CLINIC_OR_DEPARTMENT_OTHER): Payer: Medicare Other | Admitting: Physician Assistant

## 2012-04-22 VITALS — BP 181/87 | HR 79 | Temp 97.1°F | Resp 20 | Ht 62.5 in | Wt 186.3 lb

## 2012-04-22 DIAGNOSIS — C50912 Malignant neoplasm of unspecified site of left female breast: Secondary | ICD-10-CM

## 2012-04-22 DIAGNOSIS — Z853 Personal history of malignant neoplasm of breast: Secondary | ICD-10-CM

## 2012-04-22 LAB — COMPREHENSIVE METABOLIC PANEL (CC13)
ALT: 19 U/L (ref 0–55)
AST: 27 U/L (ref 5–34)
Albumin: 3.7 g/dL (ref 3.5–5.0)
Alkaline Phosphatase: 45 U/L (ref 40–150)
BUN: 23.9 mg/dL (ref 7.0–26.0)
CO2: 27 meq/L (ref 22–29)
Calcium: 9.2 mg/dL (ref 8.4–10.4)
Chloride: 100 meq/L (ref 98–107)
Creatinine: 1.3 mg/dL — ABNORMAL HIGH (ref 0.6–1.1)
Glucose: 311 mg/dL — ABNORMAL HIGH (ref 70–99)
Potassium: 4 meq/L (ref 3.5–5.1)
Sodium: 138 meq/L (ref 136–145)
Total Bilirubin: 0.45 mg/dL (ref 0.20–1.20)
Total Protein: 7.2 g/dL (ref 6.4–8.3)

## 2012-04-22 LAB — CBC WITH DIFFERENTIAL/PLATELET
Basophils Absolute: 0 10*3/uL (ref 0.0–0.1)
Eosinophils Absolute: 0.2 10*3/uL (ref 0.0–0.5)
HGB: 13.8 g/dL (ref 11.6–15.9)
MCV: 94.3 fL (ref 79.5–101.0)
MONO#: 0.3 10*3/uL (ref 0.1–0.9)
MONO%: 5.8 % (ref 0.0–14.0)
NEUT#: 3.9 10*3/uL (ref 1.5–6.5)
RBC: 4.28 10*6/uL (ref 3.70–5.45)
RDW: 14.4 % (ref 11.2–14.5)
WBC: 6.1 10*3/uL (ref 3.9–10.3)
lymph#: 1.6 10*3/uL (ref 0.9–3.3)

## 2012-04-22 LAB — LACTATE DEHYDROGENASE (CC13): LDH: 187 U/L (ref 125–245)

## 2012-04-22 NOTE — Patient Instructions (Signed)
Return in one year with labs

## 2012-04-22 NOTE — Telephone Encounter (Signed)
appts made and printed for pt Jillian °

## 2012-04-22 NOTE — Progress Notes (Signed)
[facility]cer Center  [phone]   OFFICE PROGRESS NOTE  Michiel Sites, MD [name], Ph.D., M.D.  Consuello Bossier., M.D.  [facility] Norma Fredrickson, [facility]  PATIENT IDENTIFICATION: 1. Cancer of the left breast dating back to November 2000, with 2 synchronous small undifferentiated cancers located in the upper inner quadrant of the left breast. Both tumors were hormone receptor negative with high proliferative fractions. One of the tumors was HER-2 three plus positive. There was a small 3 mm focus of tumor in 1 of the sentinel lymph nodes. The remaining 13 lymph nodes were negative. Following a local excision Jillian Willis completed 4 cycles of adjuvant chemotherapy with Cytoxan and Adriamycin. She then underwent radiation treatments to the left breast, which were completed in June of 2001. The patient has remained disease-free since that time without recurrence.  2. 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  3. Reductive mammoplasty on the right breast on 03/21/2004 by Dr. Pleas Patricia.  4. Morbid obesity.  5. Hypertension.  6. Diabetes mellitus type 2.  7. GERD. 8. Primary esophageal dysphagia per Barium swallow exam on 06/06/11 9. History of hypothyroidism.  10. History of depression.  11.Dyslipidemia.  12.Stress and urge incontinence. 13. Ischemic Heart Disease (IHD), s/p multiple cardiac catheterizations on 05/09/2012, 11/14/2002, 06/29/2007. Last stress test was in 2012 and was felt to be low risk. There was an old anteroapical scar with minimal reversible ischemia. It was unchanged from her study of 2009  INTERVAL HISTORY:  Jillian Willis is here today with her husband, Jillian Willis. She was last seen by [place] on 04/21/2011. She has done well since the completion of her treatments in June of 2001. Her breast cancer goes back to November 2000. She has remained continuously disease free. Jillian Willis is actively losing  weight. She has done extremely well with dieting. Her weight at one time was up to 280 pounds going back to 2006. She has been successful been losing about 94 pounds since that time, today 186 lbs. Her last Physical Exam was performed on 08/2011  By Dr. Juleen China with no new findings per patient report. In addition, she had a Ba swallow exam on 06/03/11 due to chronic dysphagia with solids and liquids. Per chart notes, [name] conducted laryngoscopy with no abnormal findings. BA swallow report  On 06/03/2011 was within functional limits, suspected primary esophageal dysphagia in the setting of GERD, to be followed by esophageal evaluation by GI. Patient has been seen by Dr. Derrell Lolling on 08/2011. Per chart notes, no abnormalities were found during that visit. Her last mammogram was performed on 02/11/12, with negative results. At one point she was concerned of nodularity in her soft tissues over the left lower rib cage, which per exam on 04/21/11 were benign appearing . She had a CT of the abdomen and pelvis on 12/09/2011 for evaluation of right upper abdominal pain, which was negative for metastatic disease. Only remarkable finding was a tiny 6 mm subcapsular splenic fluid collection, likely old small subcapsular hematoma. No associated rib fractures or perisplenic fluid were seen. She had a PAP exam by [facility] in October of 2013 with negative results.      MEDICATIONS: Current Outpatient Prescriptions  Medication Sig Dispense Refill  . ACCU-CHEK SMARTVIEW test strip       . aspirin 81 MG tablet Take 81 mg by mouth daily.       Marland Kitchen escitalopram (LEXAPRO) 10 MG tablet Take 10 mg  by mouth as needed.      . fenofibrate (TRICOR) 145 MG tablet Take 145 mg by mouth daily.      Marland Kitchen glipiZIDE (GLUCOTROL) 5 MG tablet Take 5 mg by mouth 2 (two) times daily before a meal.      . LANTUS SOLOSTAR 100 UNIT/ML injection       . levothyroxine (SYNTHROID, LEVOTHROID) 200 MCG tablet Take 200 mcg by mouth daily.      .  Liraglutide (VICTOZA [place]) Inject 1.2 Units into the skin.       . metFORMIN (GLUCOPHAGE-XR) 500 MG 24 hr tablet       . nitroGLYCERIN (NITROSTAT) 0.4 MG SL tablet Place 1 tablet (0.4 mg total) under the tongue every 5 (five) minutes as needed.  25 tablet  6  . NOVOFINE 32G X 6 MM MISC       . rosuvastatin (CRESTOR) 10 MG tablet Take 10 mg by mouth daily.      . solifenacin (VESICARE) 5 MG tablet Take 10 mg by mouth daily.      . TRILIPIX 135 MG capsule         ALLERGIES:  is allergic to codeine.  Allergies to ragweed, mold, dust, and trees.   REVIEW OF SYSTEMS:  The rest of the 14-point review of system was negative.   There were no vitals filed for this visit. Wt Readings from Last 3 Encounters:  08/01/11 183 lb 9.6 oz (83.28 kg)  05/16/11 185 lb (83.915 kg)  04/21/11 190 lb (86.183 kg)      PHYSICAL EXAMINATION:  There is no scleral icterus.  Mouth and pharynx are benign. There is no peripheral adenopathy palpable.  Heart RRR with 1/6 soft systolic murmur,  No rubs or gallops. Lungs: Clear to auscultation.  Breast Exam:  The patient is undergone a right- sided reductive mammoplasty. There is very good symmetry between the 2 breasts. Both breasts are fairly soft. Left breast has undergone surgery and radiation treatments. The erythema that she used to have in the central part of the breast above the nipple areolar complex has faded, so has the induration. There are no suspicious findings. Both nipples are flat. The patient apparently had some surgery on her left nipple-areolar complex when she was a child. The  nodularity in her soft tissues over the left lower rib cage has resolved.  Abdomen is soft , nontender with no organomegaly or masses palpable.  Extremities no peripheral edema or clubbing. The patient does have swelling of her left upper arm, chronic. There is no swelling of the left lower arm. Neurologic exam is grossly normal.      LABORATORY/RADIOLOGY DATA:   Lab  04/22/12 0902  WBC 6.1  HGB 13.8  HCT 40.3  PLT 190  MCV 94.3  MCH 32.2  MCHC 34.2  RDW 14.4  LYMPHSABS 1.6  MONOABS 0.3  EOSABS 0.2  BASOSABS 0.0  BANDABS --    CMP    Lab 04/22/12 0902  NA 138  K 4.0  CL 100  CO2 27  GLUCOSE 311*  BUN 23.9  CREATININE 1.3*  CALCIUM 9.2  MG --  AST 27  ALT 19  ALKPHOS 45  BILITOT 0.45        Component Value Date/Time   BILITOT 0.45 04/22/2012 0902   BILITOT 0.4 04/21/2011 1405     Radiology Studies:   1. CT scan of the abdomen with IV contrast on 08/10/2009 and on 12/09/2011  was negative.  2.  Digital screening mammogram on 01/28/2010  and  on 02/11/12 was negative.  3. Chest x-ray, 2 views, from 02/19/2010 showed no active disease.  4. Bilateral screening mammograms from 01/31/2011 were negative.  5. Barium Swallow exam on 05/27/2011 showing Primary esophageal dysphagia.    ASSESSMENT AND PLAN:  Jillian Willis is now out over 13 years from the time of diagnosis without evidence of recurrent disease. We plan to see Jillian Willis again in 1 year at which time we will check CBC, LDH  and chemistries. She will be due for her yearly mammogram in November of 2014.PAtient has asked about possible cosmetic repair of the swelling on her left arm. She is to make an appointment with Dr. Stephens November to discuss the issue.  She knows to call us in the interim for any questions or concerns.

## 2012-04-30 DIAGNOSIS — E119 Type 2 diabetes mellitus without complications: Secondary | ICD-10-CM | POA: Diagnosis not present

## 2012-05-12 DIAGNOSIS — E119 Type 2 diabetes mellitus without complications: Secondary | ICD-10-CM | POA: Diagnosis not present

## 2012-05-12 DIAGNOSIS — I10 Essential (primary) hypertension: Secondary | ICD-10-CM | POA: Diagnosis not present

## 2012-05-12 DIAGNOSIS — E789 Disorder of lipoprotein metabolism, unspecified: Secondary | ICD-10-CM | POA: Diagnosis not present

## 2012-05-19 DIAGNOSIS — K219 Gastro-esophageal reflux disease without esophagitis: Secondary | ICD-10-CM | POA: Insufficient documentation

## 2012-05-19 DIAGNOSIS — E785 Hyperlipidemia, unspecified: Secondary | ICD-10-CM | POA: Insufficient documentation

## 2012-05-19 DIAGNOSIS — F329 Major depressive disorder, single episode, unspecified: Secondary | ICD-10-CM | POA: Insufficient documentation

## 2012-05-19 DIAGNOSIS — I10 Essential (primary) hypertension: Secondary | ICD-10-CM | POA: Insufficient documentation

## 2012-05-19 DIAGNOSIS — E039 Hypothyroidism, unspecified: Secondary | ICD-10-CM | POA: Insufficient documentation

## 2012-05-19 DIAGNOSIS — I259 Chronic ischemic heart disease, unspecified: Secondary | ICD-10-CM | POA: Insufficient documentation

## 2012-05-19 DIAGNOSIS — E669 Obesity, unspecified: Secondary | ICD-10-CM | POA: Insufficient documentation

## 2012-05-19 DIAGNOSIS — F32A Depression, unspecified: Secondary | ICD-10-CM | POA: Insufficient documentation

## 2012-05-20 ENCOUNTER — Encounter: Payer: Self-pay | Admitting: Cardiology

## 2012-05-20 ENCOUNTER — Ambulatory Visit (INDEPENDENT_AMBULATORY_CARE_PROVIDER_SITE_OTHER): Payer: Medicare Other | Admitting: Cardiology

## 2012-05-20 VITALS — BP 140/80 | HR 74 | Wt 183.0 lb

## 2012-05-20 DIAGNOSIS — I10 Essential (primary) hypertension: Secondary | ICD-10-CM

## 2012-05-20 DIAGNOSIS — E785 Hyperlipidemia, unspecified: Secondary | ICD-10-CM

## 2012-05-20 DIAGNOSIS — I2510 Atherosclerotic heart disease of native coronary artery without angina pectoris: Secondary | ICD-10-CM

## 2012-05-20 MED ORDER — NITROGLYCERIN 0.4 MG SL SUBL: 0.4000 mg | SUBLINGUAL_TABLET | SUBLINGUAL | Status: AC | PRN

## 2012-05-20 NOTE — Progress Notes (Signed)
HPI: Pleasant female for fu of CAD. She is a former patient of Dr. Ronnald Nian. She has had remote angioplasty of the mid LAD back in 2004. Last cath in 2009 showed a 30-40% left main, 60-70% ostial circumflex which was relatively small, 30-40% proximal LAD. Ejection fraction was 50-55% with mild anterior hypokinesis. Her last stress test was in March 2012 and was felt to be low risk. There was an old anteroapical scar with minimal reversible ischemia; EF 68. It was unchanged from her study of 2009. Her other issues include obesity, DM, HTN, hyperlipidemia and depression. Patient last seen in Feb of 2013. Since then, the patient denies any dyspnea on exertion, orthopnea, PND, pedal edema, palpitations, syncope or chest pain.    Current Outpatient Prescriptions  Medication Sig Dispense Refill  . ACCU-CHEK SMARTVIEW test strip       . aspirin 81 MG tablet Take 81 mg by mouth daily.       Marland Kitchen escitalopram (LEXAPRO) 10 MG tablet Take 10 mg by mouth as needed.      . fenofibrate (TRICOR) 145 MG tablet Take 145 mg by mouth daily.      Marland Kitchen glipiZIDE (GLUCOTROL) 5 MG tablet Take 5 mg by mouth 2 (two) times daily before a meal.      . LANTUS SOLOSTAR 100 UNIT/ML injection as directed.       Marland Kitchen levothyroxine (SYNTHROID, LEVOTHROID) 200 MCG tablet Take 200 mcg by mouth daily.      . Liraglutide (VICTOZA [place]) Inject 1.2 Units into the skin.       . metFORMIN (GLUCOPHAGE-XR) 500 MG 24 hr tablet 2 tabs po bid      . nitroGLYCERIN (NITROSTAT) 0.4 MG SL tablet Place 1 tablet (0.4 mg total) under the tongue every 5 (five) minutes as needed.  25 tablet  6  . NOVOFINE 32G X 6 MM MISC       . rosuvastatin (CRESTOR) 10 MG tablet Take 10 mg by mouth daily.      . solifenacin (VESICARE) 5 MG tablet Take 10 mg by mouth daily.      . TRILIPIX 135 MG capsule Take 135 mg by mouth daily.        No current facility-administered medications for this visit.     Past Medical History  Diagnosis Date  . Hyperlipidemia   .  Hypertension   . Diabetes mellitus   . Obesity   . 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 Remote angioplasty of the mid LAD in 2004.  Last myoview in 2012 felt to be low risk.   . Depression   . GERD (gastroesophageal reflux disease)   . Hypothyroidism     Past Surgical History  Procedure Laterality Date  . Cardiac catheterization  06/29/2007    EF 50-55%  . Cardiac catheterization  11/14/2002    EF 55-60%  . Cardiac catheterization  05/09/2002    EF 45%  . Cardiovascular stress test  05/23/10    EF 68%, NO ISCHEMIA  . Breast surgery  2000    lympectomy on Left Br  . Breast surgery  2005    Right Br Reduction    History   Social History  . Marital Status: Married    Spouse Name: N/A    Number of Children: N/A  . Years of Education: N/A   Occupational History  . Not on file.  Social History Main Topics  . Smoking status: Never Smoker   . Smokeless tobacco: Not on file  . Alcohol Use: No  . Drug Use: No  . Sexually Active:    Other Topics Concern  . Not on file   Social History Narrative  . No narrative on file    ROS: no fevers or chills, productive cough, hemoptysis, dysphasia, odynophagia, melena, hematochezia, dysuria, hematuria, rash, seizure activity, orthopnea, PND, pedal edema, claudication. Remaining systems are negative.  Physical Exam: Well-developed well-nourished in no acute distress.  Skin is warm and dry.  HEENT is normal.  Neck is supple.  Chest is clear to auscultation with normal expansion.  Cardiovascular exam is regular rate and rhythm.  Abdominal exam nontender or distended. No masses palpated. Extremities show no edema. neuro grossly intact  ECG sinus rhythm with fusion complexes. Nonspecific ST changes.

## 2012-05-20 NOTE — Patient Instructions (Addendum)
Your physician wants you to follow-up in: ONE YEAR WITH DR CRENSHAW You will receive a reminder letter in the mail two months in advance. If you don't receive a letter, please call our office to schedule the follow-up appointment.  

## 2012-05-20 NOTE — Assessment & Plan Note (Addendum)
Blood pressure controlled at present.

## 2012-05-20 NOTE — Assessment & Plan Note (Signed)
Continue present medications. Lipids and liver monitored by primary care. 

## 2012-05-20 NOTE — Assessment & Plan Note (Signed)
Continue aspirin and statin. Plan functional study when she returns in one year. Continue risk factor modification.

## 2012-06-17 ENCOUNTER — Encounter (INDEPENDENT_AMBULATORY_CARE_PROVIDER_SITE_OTHER): Payer: Self-pay | Admitting: General Surgery

## 2012-06-29 DIAGNOSIS — E119 Type 2 diabetes mellitus without complications: Secondary | ICD-10-CM | POA: Diagnosis not present

## 2012-06-29 DIAGNOSIS — R42 Dizziness and giddiness: Secondary | ICD-10-CM | POA: Diagnosis not present

## 2012-06-29 DIAGNOSIS — H60399 Other infective otitis externa, unspecified ear: Secondary | ICD-10-CM | POA: Diagnosis not present

## 2012-07-21 DIAGNOSIS — R42 Dizziness and giddiness: Secondary | ICD-10-CM | POA: Diagnosis not present

## 2012-07-30 DIAGNOSIS — I69998 Other sequelae following unspecified cerebrovascular disease: Secondary | ICD-10-CM | POA: Diagnosis not present

## 2012-08-02 DIAGNOSIS — E119 Type 2 diabetes mellitus without complications: Secondary | ICD-10-CM | POA: Diagnosis not present

## 2012-08-11 DIAGNOSIS — R0989 Other specified symptoms and signs involving the circulatory and respiratory systems: Secondary | ICD-10-CM | POA: Diagnosis not present

## 2012-08-11 DIAGNOSIS — R0609 Other forms of dyspnea: Secondary | ICD-10-CM | POA: Diagnosis not present

## 2012-08-11 DIAGNOSIS — E119 Type 2 diabetes mellitus without complications: Secondary | ICD-10-CM | POA: Diagnosis not present

## 2012-08-11 DIAGNOSIS — E789 Disorder of lipoprotein metabolism, unspecified: Secondary | ICD-10-CM | POA: Diagnosis not present

## 2012-09-03 DIAGNOSIS — E0789 Other specified disorders of thyroid: Secondary | ICD-10-CM | POA: Diagnosis not present

## 2012-09-03 DIAGNOSIS — E789 Disorder of lipoprotein metabolism, unspecified: Secondary | ICD-10-CM | POA: Diagnosis not present

## 2012-09-03 DIAGNOSIS — E119 Type 2 diabetes mellitus without complications: Secondary | ICD-10-CM | POA: Diagnosis not present

## 2012-09-03 DIAGNOSIS — Z79899 Other long term (current) drug therapy: Secondary | ICD-10-CM | POA: Diagnosis not present

## 2012-09-03 DIAGNOSIS — N390 Urinary tract infection, site not specified: Secondary | ICD-10-CM | POA: Diagnosis not present

## 2012-09-08 ENCOUNTER — Ambulatory Visit (INDEPENDENT_AMBULATORY_CARE_PROVIDER_SITE_OTHER): Payer: Medicare Other | Admitting: General Surgery

## 2012-09-20 DIAGNOSIS — R0989 Other specified symptoms and signs involving the circulatory and respiratory systems: Secondary | ICD-10-CM | POA: Diagnosis not present

## 2012-09-20 DIAGNOSIS — N390 Urinary tract infection, site not specified: Secondary | ICD-10-CM | POA: Diagnosis not present

## 2012-09-20 DIAGNOSIS — E789 Disorder of lipoprotein metabolism, unspecified: Secondary | ICD-10-CM | POA: Diagnosis not present

## 2012-09-20 DIAGNOSIS — E119 Type 2 diabetes mellitus without complications: Secondary | ICD-10-CM | POA: Diagnosis not present

## 2012-09-23 ENCOUNTER — Ambulatory Visit (INDEPENDENT_AMBULATORY_CARE_PROVIDER_SITE_OTHER): Payer: Medicare Other | Admitting: General Surgery

## 2012-09-28 DIAGNOSIS — C44320 Squamous cell carcinoma of skin of unspecified parts of face: Secondary | ICD-10-CM | POA: Diagnosis not present

## 2012-09-28 DIAGNOSIS — D485 Neoplasm of uncertain behavior of skin: Secondary | ICD-10-CM | POA: Diagnosis not present

## 2012-09-28 DIAGNOSIS — D0439 Carcinoma in situ of skin of other parts of face: Secondary | ICD-10-CM | POA: Diagnosis not present

## 2012-09-28 DIAGNOSIS — L821 Other seborrheic keratosis: Secondary | ICD-10-CM | POA: Diagnosis not present

## 2012-09-28 DIAGNOSIS — L570 Actinic keratosis: Secondary | ICD-10-CM | POA: Diagnosis not present

## 2012-10-06 DIAGNOSIS — N3020 Other chronic cystitis without hematuria: Secondary | ICD-10-CM | POA: Diagnosis not present

## 2012-11-09 DIAGNOSIS — Z85828 Personal history of other malignant neoplasm of skin: Secondary | ICD-10-CM | POA: Diagnosis not present

## 2012-11-09 DIAGNOSIS — L570 Actinic keratosis: Secondary | ICD-10-CM | POA: Diagnosis not present

## 2012-12-13 DIAGNOSIS — E0789 Other specified disorders of thyroid: Secondary | ICD-10-CM | POA: Diagnosis not present

## 2012-12-13 DIAGNOSIS — E119 Type 2 diabetes mellitus without complications: Secondary | ICD-10-CM | POA: Diagnosis not present

## 2012-12-22 DIAGNOSIS — E789 Disorder of lipoprotein metabolism, unspecified: Secondary | ICD-10-CM | POA: Diagnosis not present

## 2012-12-22 DIAGNOSIS — E0789 Other specified disorders of thyroid: Secondary | ICD-10-CM | POA: Diagnosis not present

## 2012-12-22 DIAGNOSIS — Z23 Encounter for immunization: Secondary | ICD-10-CM | POA: Diagnosis not present

## 2012-12-22 DIAGNOSIS — E119 Type 2 diabetes mellitus without complications: Secondary | ICD-10-CM | POA: Diagnosis not present

## 2013-01-03 DIAGNOSIS — E119 Type 2 diabetes mellitus without complications: Secondary | ICD-10-CM | POA: Diagnosis not present

## 2013-01-03 DIAGNOSIS — R05 Cough: Secondary | ICD-10-CM | POA: Diagnosis not present

## 2013-01-09 DIAGNOSIS — S0990XA Unspecified injury of head, initial encounter: Secondary | ICD-10-CM | POA: Diagnosis not present

## 2013-01-09 DIAGNOSIS — Z79899 Other long term (current) drug therapy: Secondary | ICD-10-CM | POA: Diagnosis not present

## 2013-01-09 DIAGNOSIS — S8000XA Contusion of unspecified knee, initial encounter: Secondary | ICD-10-CM | POA: Diagnosis not present

## 2013-01-09 DIAGNOSIS — IMO0002 Reserved for concepts with insufficient information to code with codable children: Secondary | ICD-10-CM | POA: Diagnosis not present

## 2013-01-09 DIAGNOSIS — W19XXXA Unspecified fall, initial encounter: Secondary | ICD-10-CM | POA: Diagnosis not present

## 2013-01-09 DIAGNOSIS — Z885 Allergy status to narcotic agent status: Secondary | ICD-10-CM | POA: Diagnosis not present

## 2013-01-09 DIAGNOSIS — E785 Hyperlipidemia, unspecified: Secondary | ICD-10-CM | POA: Diagnosis not present

## 2013-01-09 DIAGNOSIS — Z883 Allergy status to other anti-infective agents status: Secondary | ICD-10-CM | POA: Diagnosis not present

## 2013-01-09 DIAGNOSIS — I10 Essential (primary) hypertension: Secondary | ICD-10-CM | POA: Diagnosis not present

## 2013-01-09 DIAGNOSIS — E079 Disorder of thyroid, unspecified: Secondary | ICD-10-CM | POA: Diagnosis not present

## 2013-01-09 DIAGNOSIS — S8990XA Unspecified injury of unspecified lower leg, initial encounter: Secondary | ICD-10-CM | POA: Diagnosis not present

## 2013-01-09 DIAGNOSIS — S41009A Unspecified open wound of unspecified shoulder, initial encounter: Secondary | ICD-10-CM | POA: Diagnosis not present

## 2013-01-09 DIAGNOSIS — S0993XA Unspecified injury of face, initial encounter: Secondary | ICD-10-CM | POA: Diagnosis not present

## 2013-01-09 DIAGNOSIS — R7309 Other abnormal glucose: Secondary | ICD-10-CM | POA: Diagnosis not present

## 2013-01-09 DIAGNOSIS — E119 Type 2 diabetes mellitus without complications: Secondary | ICD-10-CM | POA: Diagnosis not present

## 2013-01-09 DIAGNOSIS — S0003XA Contusion of scalp, initial encounter: Secondary | ICD-10-CM | POA: Diagnosis not present

## 2013-01-10 ENCOUNTER — Other Ambulatory Visit: Payer: Self-pay

## 2013-01-10 DIAGNOSIS — Z1231 Encounter for screening mammogram for malignant neoplasm of breast: Secondary | ICD-10-CM

## 2013-01-17 DIAGNOSIS — Z23 Encounter for immunization: Secondary | ICD-10-CM | POA: Diagnosis not present

## 2013-01-31 DIAGNOSIS — Z0000 Encounter for general adult medical examination without abnormal findings: Secondary | ICD-10-CM | POA: Diagnosis not present

## 2013-02-11 ENCOUNTER — Ambulatory Visit: Payer: Medicare Other

## 2013-03-02 DIAGNOSIS — R05 Cough: Secondary | ICD-10-CM | POA: Diagnosis not present

## 2013-03-02 DIAGNOSIS — R112 Nausea with vomiting, unspecified: Secondary | ICD-10-CM | POA: Diagnosis not present

## 2013-03-02 DIAGNOSIS — J329 Chronic sinusitis, unspecified: Secondary | ICD-10-CM | POA: Diagnosis not present

## 2013-03-02 DIAGNOSIS — E119 Type 2 diabetes mellitus without complications: Secondary | ICD-10-CM | POA: Diagnosis not present

## 2013-03-03 DIAGNOSIS — J329 Chronic sinusitis, unspecified: Secondary | ICD-10-CM | POA: Diagnosis not present

## 2013-03-03 DIAGNOSIS — E119 Type 2 diabetes mellitus without complications: Secondary | ICD-10-CM | POA: Diagnosis not present

## 2013-03-03 DIAGNOSIS — E0789 Other specified disorders of thyroid: Secondary | ICD-10-CM | POA: Diagnosis not present

## 2013-03-07 DIAGNOSIS — H902 Conductive hearing loss, unspecified: Secondary | ICD-10-CM | POA: Diagnosis not present

## 2013-03-07 DIAGNOSIS — H6980 Other specified disorders of Eustachian tube, unspecified ear: Secondary | ICD-10-CM | POA: Diagnosis not present

## 2013-03-15 ENCOUNTER — Ambulatory Visit: Payer: Medicare Other

## 2013-03-18 ENCOUNTER — Ambulatory Visit
Admission: RE | Admit: 2013-03-18 | Discharge: 2013-03-18 | Disposition: A | Discharge status: Home / Self Care | Payer: Medicare Other | Source: Ambulatory Visit

## 2013-03-18 DIAGNOSIS — Z1231 Encounter for screening mammogram for malignant neoplasm of breast: Secondary | ICD-10-CM | POA: Diagnosis not present

## 2013-03-30 DIAGNOSIS — E119 Type 2 diabetes mellitus without complications: Secondary | ICD-10-CM | POA: Diagnosis not present

## 2013-04-04 DIAGNOSIS — H6520 Chronic serous otitis media, unspecified ear: Secondary | ICD-10-CM | POA: Diagnosis not present

## 2013-04-04 DIAGNOSIS — H903 Sensorineural hearing loss, bilateral: Secondary | ICD-10-CM | POA: Diagnosis not present

## 2013-04-05 DIAGNOSIS — E0789 Other specified disorders of thyroid: Secondary | ICD-10-CM | POA: Diagnosis not present

## 2013-04-05 DIAGNOSIS — E789 Disorder of lipoprotein metabolism, unspecified: Secondary | ICD-10-CM | POA: Diagnosis not present

## 2013-04-05 DIAGNOSIS — E119 Type 2 diabetes mellitus without complications: Secondary | ICD-10-CM | POA: Diagnosis not present

## 2013-04-05 DIAGNOSIS — I10 Essential (primary) hypertension: Secondary | ICD-10-CM | POA: Diagnosis not present

## 2013-04-22 ENCOUNTER — Other Ambulatory Visit: Payer: Self-pay | Admitting: Medical Oncology

## 2013-04-22 ENCOUNTER — Encounter (INDEPENDENT_AMBULATORY_CARE_PROVIDER_SITE_OTHER): Payer: Self-pay

## 2013-04-22 ENCOUNTER — Other Ambulatory Visit (HOSPITAL_BASED_OUTPATIENT_CLINIC_OR_DEPARTMENT_OTHER): Payer: Medicare Other

## 2013-04-22 ENCOUNTER — Telehealth: Payer: Self-pay | Admitting: Internal Medicine

## 2013-04-22 ENCOUNTER — Ambulatory Visit (HOSPITAL_BASED_OUTPATIENT_CLINIC_OR_DEPARTMENT_OTHER): Payer: Medicare Other | Admitting: Internal Medicine

## 2013-04-22 VITALS — BP 152/74 | HR 68 | Temp 97.4°F | Resp 20 | Ht 62.5 in | Wt 176.6 lb

## 2013-04-22 DIAGNOSIS — C50912 Malignant neoplasm of unspecified site of left female breast: Secondary | ICD-10-CM

## 2013-04-22 DIAGNOSIS — Z853 Personal history of malignant neoplasm of breast: Secondary | ICD-10-CM

## 2013-04-22 LAB — COMPREHENSIVE METABOLIC PANEL (CC13)
ALBUMIN: 3.6 g/dL (ref 3.5–5.0)
ALK PHOS: 37 U/L — AB (ref 40–150)
ALT: 23 U/L (ref 0–55)
AST: 22 U/L (ref 5–34)
Anion Gap: 9 mEq/L (ref 3–11)
BUN: 18.4 mg/dL (ref 7.0–26.0)
CALCIUM: 9.4 mg/dL (ref 8.4–10.4)
CHLORIDE: 101 meq/L (ref 98–109)
CO2: 27 mEq/L (ref 22–29)
CREATININE: 1.1 mg/dL (ref 0.6–1.1)
GLUCOSE: 308 mg/dL — AB (ref 70–140)
POTASSIUM: 3.9 meq/L (ref 3.5–5.1)
Sodium: 137 mEq/L (ref 136–145)
Total Bilirubin: 0.48 mg/dL (ref 0.20–1.20)
Total Protein: 6.9 g/dL (ref 6.4–8.3)

## 2013-04-22 LAB — CBC WITH DIFFERENTIAL/PLATELET
BASO%: 0.7 % (ref 0.0–2.0)
BASOS ABS: 0 10*3/uL (ref 0.0–0.1)
EOS%: 2.4 % (ref 0.0–7.0)
Eosinophils Absolute: 0.1 10*3/uL (ref 0.0–0.5)
HEMATOCRIT: 39.2 % (ref 34.8–46.6)
HEMOGLOBIN: 13 g/dL (ref 11.6–15.9)
LYMPH%: 34.2 % (ref 14.0–49.7)
MCH: 31.2 pg (ref 25.1–34.0)
MCHC: 33.3 g/dL (ref 31.5–36.0)
MCV: 93.8 fL (ref 79.5–101.0)
MONO#: 0.4 10*3/uL (ref 0.1–0.9)
MONO%: 6.7 % (ref 0.0–14.0)
NEUT#: 3.1 10*3/uL (ref 1.5–6.5)
NEUT%: 56 % (ref 38.4–76.8)
PLATELETS: 228 10*3/uL (ref 145–400)
RBC: 4.17 10*6/uL (ref 3.70–5.45)
RDW: 13.6 % (ref 11.2–14.5)
WBC: 5.5 10*3/uL (ref 3.9–10.3)
lymph#: 1.9 10*3/uL (ref 0.9–3.3)

## 2013-04-22 LAB — LACTATE DEHYDROGENASE (CC13): LDH: 182 U/L (ref 125–245)

## 2013-04-22 NOTE — Patient Instructions (Signed)
Lymphedema Lymphedema is a swelling caused by the abnormal collection of lymph under the skin. The lymph is fluid from the tissues in your body that travels in the lymphatic system. This system is part of the immune system that includes lymph nodes and vessels. The lymph vessels collect and carry the excess fluid, fats, proteins, and wastes from the tissues of the body to the bloodstream. This system also works to clean and remove bacteria and waste products from the body.  Lymphedema occurs when the lymphatic system is blocked. When the lymph vessels or lymph nodes are blocked or damaged, lymph does not drain properly. This causes abnormal build up of lymph. This leads to swelling in the arms or legs. Lymphedema cannot be cured by medicines. But the swelling can be reduced by physical methods. CAUSES  There are two types of lymphedema. Primary lymphedema is caused by the absence or abnormality of the lymph vessel at birth. It is also known as inherited lymphedema, which occurs rarely. Secondary or acquired lymphedema occurs when the lymph vessel is damaged or blocked. The causes of lymph vessel blockage are:   Skin infection like cellulites.  Infection by parasites (filariasis).  Injury.  Cancer.  Radiation therapy.  Formation of scar tissue.  Surgery. SYMPTOMS  The symptoms of lymphedema are:  Abnormal swelling of the arm or leg.  Heavy or tight feeling in your arm or leg.  Tight-fitting shoes or rings.  Redness of skin over the affected area.  Limited movement of the affected limb.  Some patients complain about sensitivity to touch and discomfort in the limb(s) affected. You may not have these symptoms immediately following injury. They usually appear within a few days or even years after injury. Inform your caregiver, if you have any of these symptoms. Early treatment can avoid further problems.  DIAGNOSIS  First, your caregiver will inquire about any surgery you have had or  medicines you are taking. He will then examine you. Your caregiver may order special imaging tests, such as:  Lymphoscintigraphy (a test in which a low dose of radioactive substance is injected to trace the flow of lymph through the lymph vessels).  MRI (imaging tests using magnetic fields).  Computed tomography (test using special cross-sectional X-rays).  Duplex ultrasound (test using high-frequency sound waves to show the vessels and the blood flow on a screen).  Lymphangiography (special X-ray taken after injecting a contrast dye into the lymph vessel). It is now rarely done. TREATMENT  Lymphedema can be treated in different ways. Your caregiver will decide the type of treatment depending on the cause. Treatment may include:  Exercise: Special exercises will help fluid move out easily from the affected part. This should be done as per your caregiver's advice.  Manual lymph drainage: Gentle massage of the affected limb makes the fluid to move out more freely.  Compression: Compression stockings or external pump apply pressure over the affected limb. This helps the fluid to move out from the arm or leg. Bandaging can also help to move the fluid out from the affected part. Your caregiver will decide the method that suits you the best.  Medicines: Your caregiver may prescribe antibiotics, if you have infection.  Surgery: Your caregiver may advise surgery for severe lymphedema. It is reserved for special cases when the patient has difficulty moving. Your surgeon may remove excess tissue from the arm or leg. This will help to ease your movement. Physical therapy may have to be continued after surgery. HOME CARE INSTRUCTIONS    The area is very fragile and is predisposed to injury and infection.  Eat a healthy diet.  Exercise regularly as per advice.  Keep the affected area clean and dry.  Use gloves while cooking or gardening.  Protect your skin from cuts.  Use electric razor to  shave the affected area.  Keep affected limb elevated.  Do not wear tight clothes, shoes, or jewelry as it may cause the tissue to be strangled.  Do not use heat pads over the affected area.  Do not sit with cross legs.  Do not walk barefoot.  Do not carry weight on the affected arm.  Avoid having blood pressure checked on the affected limb. SEEK MEDICAL CARE IF:  You continue to have swelling in your limb. SEEK IMMEDIATE MEDICAL CARE IF:   You have high fever.  You have skin rash.  You have chills or sweats.  You have pain or redness.  You have a cut that does not heal. MAKE SURE YOU:   Understand these instructions.  Will watch your condition.  Will get help right away if you are not doing well or get worse. Document Released: 01/05/2007 Document Revised: 02/25/2012 Document Reviewed: 12/11/2008 [employer] Patient Information 2014 [employer].  

## 2013-04-22 NOTE — Telephone Encounter (Signed)
gv adn pritned appt sched and avs for pt for Jan 2016 °

## 2013-04-22 NOTE — Progress Notes (Signed)
[facility] OFFICE PROGRESS NOTE  KOHUT,WALTER DENNIS, MD [address] [place] [address]  DIAGNOSIS: Breast cancer, left breast - Plan: CBC with Differential, Comprehensive metabolic panel (Cmet) - [facility], Lactate dehydrogenase (LDH) - [facility]  Chief Complaint  Patient presents with  . Breast cancer, left breast    CURRENT THERAPY: Observation  INTERVAL HISTORY: Jillian Willis 73 y.o. female with a history of left breast cancer is here for annual follow-up.  Jillian Willis is here today with her husband, William. She was last seen by us on 04/22/2012. She has done well since the completion of her treatments in June of 2001. Her breast cancer goes back to November 2000. She has remained continuously disease free.  She reports having the flu on October 10 th last year.  As result, she fell and hit her head and was evaluated at [facility].  She was told that she had a contusion.  Today, she reports that her head is fine.  She is being followed by cardiology, endocrinology and ENT.    Jillian Willis is actively losing weight. She has done extremely well with dieting. Her weight at one time was up to 280 pounds going back to 2006. She has been successful been losing over 100 pounds since that time, today 177 lbs. Her last Physical Exam was performed on 08/2011 By Dr. Kohut with no new findings per patient report. In addition, she had a Barium swallow exam on 06/03/11 due to chronic dysphagia with solids and liquids. Per chart notes, Dr Teoh conducted laryngoscopy with no abnormal findings. BA swallow report On 06/03/2011 was within functional limits, suspected primary esophageal dysphagia in the setting of GERD, to be followed by esophageal evaluation by GI.   Patient has been seen by Dr. Ingram on 08/2011. Per chart notes, no abnormalities were found during that visit. Her last mammogram was performed on 03/18/2013, with negative results. At one point she was concerned of  nodularity in her soft tissues over the left lower rib cage, which per exam on 04/21/11 were benign appearing . She had a CT of the abdomen and pelvis on 12/09/2011 for evaluation of right upper abdominal pain, which was negative for metastatic disease. Only remarkable finding was a tiny 6 mm subcapsular splenic fluid collection, likely old small subcapsular hematoma. No associated rib fractures or perisplenic fluid were seen. She had a PAP exam by [facility] in October of 2013 with negative results.   MEDICAL HISTORY: Past Medical History  Diagnosis Date  . Hyperlipidemia   . Hypertension   . Diabetes mellitus   . Obesity   . 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 Remote angioplasty of the mid LAD in 2004.  Last myoview in 2012 felt to be low risk.   . Depression   . GERD (gastroesophageal reflux disease)   . Hypothyroidism     INTERIM HISTORY: has Breast cancer, left breast; CAD (coronary artery disease); Hyperlipidemia; Hypertension; Obesity; IHD (ischemic heart disease); Depression; GERD (gastroesophageal reflux disease); and Hypothyroidism on her problem list.    ALLERGIES:  is allergic to codeine.  MEDICATIONS: has a current medication list which includes the following prescription(s): accu-chek smartview, aspirin, escitalopram, fenofibrate, glipizide, lantus solostar, levothyroxine, liraglutide, metformin, nitroglycerin, novofine, rosuvastatin, solifenacin, and trilipix.  SURGICAL HISTORY:  Past Surgical History  Procedure Laterality Date  . Cardiac catheterization  06/29/2007      EF 50-55%  . Cardiac catheterization  11/14/2002    EF 55-60%  . Cardiac catheterization  05/09/2002    EF 45%  . Cardiovascular stress test  05/23/10    EF 68%, NO ISCHEMIA  . Breast surgery  2000    lympectomy on Left Br  . Breast surgery  2005    Right Br Reduction   PROBLEM LIST: 1. Cancer of the left breast  dating back to November 2000, with 2 synchronous small undifferentiated cancers located in the upper inner quadrant of the left breast. Both tumors were hormone receptor negative with high proliferative fractions. One of the tumors was HER-2 three plus positive. There was a small 3 mm focus of tumor in 1 of the sentinel lymph nodes. The remaining 13 lymph nodes were negative. Following a local excision Jillian Willis completed 4 cycles of adjuvant chemotherapy with Cytoxan and Adriamycin. She then underwent radiation treatments to the left breast, which were completed in June of 2001. The patient has remained disease-free since that time without recurrence.  2. 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  3. Reductive mammoplasty on the right breast on 03/21/2004 by Dr. Howard Holderness.  4. Morbid obesity.  5. Hypertension.  6. Diabetes mellitus type 2.  7. GERD.  8. Primary esophageal dysphagia per Barium swallow exam on 06/06/11  9. History of hypothyroidism.  10. History of depression.  11.Dyslipidemia.  12.Stress and urge incontinence.  13. Ischemic Heart Disease (IHD), s/p multiple cardiac catheterizations on 05/09/2012, 11/14/2002, 06/29/2007. Last stress test was in 2012 and was felt to be low risk. There was an old anteroapical scar with minimal reversible ischemia. It was unchanged from her study of 2009   REVIEW OF SYSTEMS:   Constitutional: Denies fevers, chills or abnormal weight loss Eyes: Denies blurriness of vision Ears, nose, mouth, throat, and face: Denies mucositis or sore throat Respiratory: Denies cough, dyspnea or wheezes Cardiovascular: Denies palpitation, chest discomfort or lower extremity swelling Gastrointestinal:  Denies nausea, heartburn or change in bowel habits Skin: Denies abnormal skin rashes Lymphatics: Denies new lymphadenopathy or easy bruising Neurological:Denies numbness, tingling or new weaknesses Behavioral/Psych: Mood is stable, no new changes   All other systems were reviewed with the patient and are negative.  PHYSICAL EXAMINATION: ECOG PERFORMANCE STATUS: 0 - Asymptomatic  Blood pressure 152/74, pulse 68, temperature 97.4 F (36.3 C), temperature source Oral, resp. rate 20, height 5' 2.5" (1.588 m), weight 176 lb 9.6 oz (80.105 kg).  GENERAL:alert, no distress and comfortable; well developed and well nourished.  Obese.  SKIN: skin color, texture, turgor are normal, no rashes or significant lesions EYES: normal, Conjunctiva are pink and non-injected, sclera clear OROPHARYNX:no exudate, no erythema and lips, buccal mucosa, and tongue normal  NECK: supple, thyroid normal size, non-tender, without nodularity LYMPH:  no palpable lymphadenopathy in the cervical, axillary or supraclavicular BREASTS: The patient is undergone a right-sided reductive mammoplasty. There is very good symmetry between the 2 breasts. Both breasts are fairly soft. Left breast has undergone surgery and radiation treatments. There are no suspicious findings. Both nipples are flat. The patient apparently had some surgery on her left nipple-areolar complex when she was a child. LUNGS: clear to auscultation with normal breathing effort HEART: regular rate & rhythm and no murmurs and no lower extremity edema ABDOMEN:abdomen soft, non-tender and normal bowel sounds Musculoskeletal:no cyanosis of digits and no clubbing  NEURO: alert & oriented x 3 with fluent speech, no focal motor/sensory deficits   LABORATORY DATA: Results   for orders placed in visit on 04/22/13 (from the past 48 hour(s))  CBC WITH DIFFERENTIAL     Status: None   Collection Time    04/22/13  9:46 AM      Result Value Range   WBC 5.5  3.9 - 10.3 10e3/uL   NEUT# 3.1  1.5 - 6.5 10e3/uL   HGB 13.0  11.6 - 15.9 g/dL   HCT 39.2  34.8 - 46.6 %   Platelets 228  145 - 400 10e3/uL   MCV 93.8  79.5 - 101.0 fL   MCH 31.2  25.1 - 34.0 pg   MCHC 33.3  31.5 - 36.0 g/dL   RBC 4.17  3.70 - 5.45 10e6/uL    RDW 13.6  11.2 - 14.5 %   lymph# 1.9  0.9 - 3.3 10e3/uL   MONO# 0.4  0.1 - 0.9 10e3/uL   Eosinophils Absolute 0.1  0.0 - 0.5 10e3/uL   Basophils Absolute 0.0  0.0 - 0.1 10e3/uL   NEUT% 56.0  38.4 - 76.8 %   LYMPH% 34.2  14.0 - 49.7 %   MONO% 6.7  0.0 - 14.0 %   EOS% 2.4  0.0 - 7.0 %   BASO% 0.7  0.0 - 2.0 %  LACTATE DEHYDROGENASE (CC13)     Status: None   Collection Time    04/22/13  9:46 AM      Result Value Range   LDH 182  125 - 245 U/L  COMPREHENSIVE METABOLIC PANEL (CC13)     Status: Abnormal   Collection Time    04/22/13  9:46 AM      Result Value Range   Sodium 137  136 - 145 mEq/L   Potassium 3.9  3.5 - 5.1 mEq/L   Chloride 101  98 - 109 mEq/L   CO2 27  22 - 29 mEq/L   Glucose 308 (*) 70 - 140 mg/dl   BUN 18.4  7.0 - 26.0 mg/dL   Creatinine 1.1  0.6 - 1.1 mg/dL   Total Bilirubin 0.48  0.20 - 1.20 mg/dL   Alkaline Phosphatase 37 (*) 40 - 150 U/L   AST 22  5 - 34 U/L   ALT 23  0 - 55 U/L   Total Protein 6.9  6.4 - 8.3 g/dL   Albumin 3.6  3.5 - 5.0 g/dL   Calcium 9.4  8.4 - 10.4 mg/dL   Anion Gap 9  3 - 11 mEq/L    Labs:  Lab Results  Component Value Date   WBC 5.5 04/22/2013   HGB 13.0 04/22/2013   HCT 39.2 04/22/2013   MCV 93.8 04/22/2013   PLT 228 04/22/2013   NEUTROABS 3.1 04/22/2013      Chemistry      Component Value Date/Time   NA 137 04/22/2013 0946   NA 136 04/21/2011 1405   K 3.9 04/22/2013 0946   K 4.1 04/21/2011 1405   CL 100 04/22/2012 0902   CL 101 04/21/2011 1405   CO2 27 04/22/2013 0946   CO2 24 04/21/2011 1405   BUN 18.4 04/22/2013 0946   BUN 20 04/21/2011 1405   CREATININE 1.1 04/22/2013 0946   CREATININE 1.03 04/21/2011 1405      Component Value Date/Time   CALCIUM 9.4 04/22/2013 0946   CALCIUM 9.1 04/21/2011 1405   ALKPHOS 37* 04/22/2013 0946   ALKPHOS 34* 04/21/2011 1405   AST 22 04/22/2013 0946   AST 31 04/21/2011 1405   ALT 23 04/22/2013 0946     ALT 27 04/21/2011 1405   BILITOT 0.48 04/22/2013 0946   BILITOT 0.4 04/21/2011 1405      CBC:  Recent Labs Lab 04/22/13 0946  WBC 5.5  NEUTROABS 3.1  HGB 13.0  HCT 39.2  MCV 93.8  PLT 228   Studies:  No results found.   RADIOGRAPHIC STUDIES: 1. CT scan of the abdomen with IV contrast on 08/10/2009 and on 12/09/2011 was negative.  2. Digital screening mammogram on 01/28/2010 and on 02/11/12 was negative.  3. Chest x-ray, 2 views, from 02/19/2010 showed no active disease.  4. Bilateral screening mammograms from 01/31/2011 were negative.  5. Barium Swallow exam on 05/27/2011 showing Primary esophageal dysphagia. 6. Digital screening mammogram on 03/18/2013 was negative.  ASSESSMENT: Jillian Willis 73 y.o. female with a history of Breast cancer, left breast - Plan: CBC with Differential, Comprehensive metabolic panel (Cmet) - [facility], Lactate dehydrogenase (LDH) - [facility]   PLAN:   Mrs. Herd is now out over 14 years from the time of diagnosis without evidence of recurrent disease. We plan to see Mrs. Cecchi again in 1 year at which time we will check CBC, LDH and chemistries. She will be due for her yearly mammogram in December 2015.  Patient has asked about possible cosmetic repair of the swelling on her left arm as she did on prior visits.  She was provided a referral to lymphedema clinic for assessment and if no improvement will make an appointment with Dr. Holderness to discuss the issue.   All questions were answered. The patient knows to call the clinic with any problems, questions or concerns. We can certainly see the patient much sooner if necessary.  I spent 10 minutes counseling the patient face to face. The total time spent in the appointment was 15 minutes.    CHISM, DAVID, MD 04/22/2013 11:04 AM   

## 2013-04-25 ENCOUNTER — Ambulatory Visit: Payer: Medicare Other | Attending: Internal Medicine | Admitting: Physical Therapy

## 2013-04-25 DIAGNOSIS — C50919 Malignant neoplasm of unspecified site of unspecified female breast: Secondary | ICD-10-CM | POA: Diagnosis not present

## 2013-04-25 DIAGNOSIS — I890 Lymphedema, not elsewhere classified: Secondary | ICD-10-CM | POA: Insufficient documentation

## 2013-04-25 DIAGNOSIS — IMO0001 Reserved for inherently not codable concepts without codable children: Secondary | ICD-10-CM | POA: Insufficient documentation

## 2013-04-26 ENCOUNTER — Ambulatory Visit: Payer: Medicare Other | Admitting: Physical Therapy

## 2013-04-27 ENCOUNTER — Telehealth: Payer: Self-pay

## 2013-04-27 NOTE — Telephone Encounter (Signed)
Faxed signed PT orders to [phone] for lymphedema rehab.

## 2013-05-02 ENCOUNTER — Ambulatory Visit: Payer: Medicare Other

## 2013-05-04 ENCOUNTER — Ambulatory Visit: Payer: Medicare Other

## 2013-05-06 ENCOUNTER — Ambulatory Visit: Payer: Medicare Other | Admitting: Physical Therapy

## 2013-05-09 ENCOUNTER — Ambulatory Visit: Payer: Medicare Other | Admitting: Physical Therapy

## 2013-05-11 ENCOUNTER — Ambulatory Visit: Payer: Medicare Other | Admitting: Physical Therapy

## 2013-05-12 ENCOUNTER — Encounter: Payer: Self-pay | Admitting: Cardiology

## 2013-05-13 ENCOUNTER — Ambulatory Visit: Payer: Medicare Other | Admitting: Physical Therapy

## 2013-05-16 ENCOUNTER — Ambulatory Visit: Payer: Medicare Other | Admitting: Physical Therapy

## 2013-05-19 ENCOUNTER — Ambulatory Visit: Payer: Medicare Other

## 2013-05-19 ENCOUNTER — Encounter: Payer: Medicare Other | Admitting: Physical Therapy

## 2013-05-23 ENCOUNTER — Ambulatory Visit: Payer: Medicare Other | Attending: Internal Medicine | Admitting: Physical Therapy

## 2013-05-23 DIAGNOSIS — IMO0001 Reserved for inherently not codable concepts without codable children: Secondary | ICD-10-CM | POA: Insufficient documentation

## 2013-05-23 DIAGNOSIS — I890 Lymphedema, not elsewhere classified: Secondary | ICD-10-CM | POA: Insufficient documentation

## 2013-05-23 DIAGNOSIS — C50919 Malignant neoplasm of unspecified site of unspecified female breast: Secondary | ICD-10-CM | POA: Insufficient documentation

## 2013-05-26 ENCOUNTER — Ambulatory Visit: Payer: Medicare Other | Admitting: Physical Therapy

## 2013-05-26 DIAGNOSIS — IMO0001 Reserved for inherently not codable concepts without codable children: Secondary | ICD-10-CM | POA: Diagnosis not present

## 2013-05-26 DIAGNOSIS — I890 Lymphedema, not elsewhere classified: Secondary | ICD-10-CM | POA: Diagnosis not present

## 2013-05-26 DIAGNOSIS — C50919 Malignant neoplasm of unspecified site of unspecified female breast: Secondary | ICD-10-CM | POA: Diagnosis not present

## 2013-05-30 ENCOUNTER — Ambulatory Visit: Payer: Medicare Other | Admitting: Cardiology

## 2013-06-01 ENCOUNTER — Ambulatory Visit: Payer: Medicare Other | Admitting: Physical Therapy

## 2013-06-01 DIAGNOSIS — I890 Lymphedema, not elsewhere classified: Secondary | ICD-10-CM | POA: Diagnosis not present

## 2013-06-01 DIAGNOSIS — C50919 Malignant neoplasm of unspecified site of unspecified female breast: Secondary | ICD-10-CM | POA: Diagnosis not present

## 2013-06-01 DIAGNOSIS — IMO0001 Reserved for inherently not codable concepts without codable children: Secondary | ICD-10-CM | POA: Diagnosis not present

## 2013-06-08 ENCOUNTER — Ambulatory Visit: Payer: Medicare Other

## 2013-06-08 DIAGNOSIS — I890 Lymphedema, not elsewhere classified: Secondary | ICD-10-CM | POA: Diagnosis not present

## 2013-06-08 DIAGNOSIS — IMO0001 Reserved for inherently not codable concepts without codable children: Secondary | ICD-10-CM | POA: Diagnosis not present

## 2013-06-08 DIAGNOSIS — C50919 Malignant neoplasm of unspecified site of unspecified female breast: Secondary | ICD-10-CM | POA: Diagnosis not present

## 2013-06-14 DIAGNOSIS — E789 Disorder of lipoprotein metabolism, unspecified: Secondary | ICD-10-CM | POA: Diagnosis not present

## 2013-06-14 DIAGNOSIS — I10 Essential (primary) hypertension: Secondary | ICD-10-CM | POA: Diagnosis not present

## 2013-06-14 DIAGNOSIS — E119 Type 2 diabetes mellitus without complications: Secondary | ICD-10-CM | POA: Diagnosis not present

## 2013-06-14 DIAGNOSIS — R109 Unspecified abdominal pain: Secondary | ICD-10-CM | POA: Diagnosis not present

## 2013-06-15 ENCOUNTER — Ambulatory Visit
Admission: RE | Admit: 2013-06-15 | Discharge: 2013-06-15 | Disposition: A | Discharge status: Home / Self Care | Payer: Medicare Other | Source: Ambulatory Visit | Attending: Endocrinology | Admitting: Endocrinology

## 2013-06-15 ENCOUNTER — Ambulatory Visit: Payer: Medicare Other

## 2013-06-15 ENCOUNTER — Other Ambulatory Visit: Payer: Self-pay | Admitting: Endocrinology

## 2013-06-15 DIAGNOSIS — R1011 Right upper quadrant pain: Secondary | ICD-10-CM

## 2013-06-15 DIAGNOSIS — N2889 Other specified disorders of kidney and ureter: Secondary | ICD-10-CM | POA: Diagnosis not present

## 2013-06-15 DIAGNOSIS — I890 Lymphedema, not elsewhere classified: Secondary | ICD-10-CM | POA: Diagnosis not present

## 2013-06-15 DIAGNOSIS — IMO0001 Reserved for inherently not codable concepts without codable children: Secondary | ICD-10-CM | POA: Diagnosis not present

## 2013-06-15 DIAGNOSIS — C50919 Malignant neoplasm of unspecified site of unspecified female breast: Secondary | ICD-10-CM | POA: Diagnosis not present

## 2013-06-15 MED ORDER — IOHEXOL 300 MG/ML  SOLN
100.0000 mL | Freq: Once | INTRAMUSCULAR | Status: AC | PRN
  Administered 2013-06-15: 100 mL via INTRAVENOUS

## 2013-06-16 DIAGNOSIS — R109 Unspecified abdominal pain: Secondary | ICD-10-CM | POA: Diagnosis not present

## 2013-06-16 DIAGNOSIS — E119 Type 2 diabetes mellitus without complications: Secondary | ICD-10-CM | POA: Diagnosis not present

## 2013-06-20 DIAGNOSIS — E119 Type 2 diabetes mellitus without complications: Secondary | ICD-10-CM | POA: Diagnosis not present

## 2013-06-20 DIAGNOSIS — R109 Unspecified abdominal pain: Secondary | ICD-10-CM | POA: Diagnosis not present

## 2013-06-22 ENCOUNTER — Encounter: Payer: Medicare Other | Admitting: Physical Therapy

## 2013-06-22 DIAGNOSIS — E119 Type 2 diabetes mellitus without complications: Secondary | ICD-10-CM | POA: Diagnosis not present

## 2013-06-22 DIAGNOSIS — N390 Urinary tract infection, site not specified: Secondary | ICD-10-CM | POA: Diagnosis not present

## 2013-06-22 DIAGNOSIS — R109 Unspecified abdominal pain: Secondary | ICD-10-CM | POA: Diagnosis not present

## 2013-07-06 DIAGNOSIS — E119 Type 2 diabetes mellitus without complications: Secondary | ICD-10-CM | POA: Diagnosis not present

## 2013-07-08 DIAGNOSIS — E119 Type 2 diabetes mellitus without complications: Secondary | ICD-10-CM | POA: Diagnosis not present

## 2013-07-19 ENCOUNTER — Encounter: Payer: Self-pay | Admitting: Cardiology

## 2013-07-19 ENCOUNTER — Encounter: Payer: Self-pay | Admitting: *Deleted

## 2013-07-19 ENCOUNTER — Ambulatory Visit (INDEPENDENT_AMBULATORY_CARE_PROVIDER_SITE_OTHER): Payer: Medicare Other | Admitting: Cardiology

## 2013-07-19 VITALS — BP 197/89 | HR 65 | Ht 62.5 in | Wt 176.8 lb

## 2013-07-19 DIAGNOSIS — E785 Hyperlipidemia, unspecified: Secondary | ICD-10-CM

## 2013-07-19 DIAGNOSIS — I10 Essential (primary) hypertension: Secondary | ICD-10-CM | POA: Diagnosis not present

## 2013-07-19 DIAGNOSIS — I2510 Atherosclerotic heart disease of native coronary artery without angina pectoris: Secondary | ICD-10-CM

## 2013-07-19 NOTE — Patient Instructions (Signed)
Your physician wants you to follow-up in: [place] will receive a reminder letter in the mail two months in advance. If you don't receive a letter, please call our office to schedule the follow-up appointment.   Your physician has requested that you have a lexiscan myoview. For further information please visit [email or web address]. Please follow instruction sheet, as given.

## 2013-07-19 NOTE — Assessment & Plan Note (Signed)
Continue statin. 

## 2013-07-19 NOTE — Progress Notes (Signed)
HPI: FU CAD. She has had remote angioplasty of the mid LAD back in 2004. Last cath in 2009 showed a 30-40% left main, 60-70% ostial circumflex which was relatively small, 30-40% proximal LAD. Ejection fraction was 50-55% with mild anterior hypokinesis. Her last stress test was in March 2012 and was felt to be low risk. There was an old anteroapical scar with minimal reversible ischemia; EF 68. It was unchanged from her study of 2009. Her other issues include obesity, DM, HTN, hyperlipidemia and depression. Patient last seen in Feb 2014. Since then, the patient denies any dyspnea on exertion, orthopnea, PND, pedal edema, palpitations, syncope or chest pain.   Current Outpatient Prescriptions  Medication Sig Dispense Refill  . ACCU-CHEK SMARTVIEW test strip       . aspirin 81 MG tablet Take 81 mg by mouth daily.       Marland Kitchen escitalopram (LEXAPRO) 10 MG tablet Take 10 mg by mouth as needed.      . fenofibrate (TRICOR) 145 MG tablet Take 145 mg by mouth daily.      Marland Kitchen glipiZIDE (GLUCOTROL) 5 MG tablet Take 5 mg by mouth 2 (two) times daily before a meal.      . LANTUS SOLOSTAR 100 UNIT/ML injection as directed.       Marland Kitchen levothyroxine (SYNTHROID, LEVOTHROID) 112 MCG tablet Take 112 mcg by mouth daily before breakfast.      . liothyronine (CYTOMEL) 25 MCG tablet       . losartan (COZAAR) 50 MG tablet       . metFORMIN (GLUCOPHAGE-XR) 500 MG 24 hr tablet 2 tabs po bid      . metoprolol tartrate (LOPRESSOR) 25 MG tablet       . nitrofurantoin, macrocrystal-monohydrate, (MACROBID) 100 MG capsule       . nitroGLYCERIN (NITROSTAT) 0.4 MG SL tablet Place 1 tablet (0.4 mg total) under the tongue every 5 (five) minutes as needed.  25 tablet  6  . rosuvastatin (CRESTOR) 10 MG tablet Take 10 mg by mouth daily.      . TRILIPIX 135 MG capsule Take 135 mg by mouth daily.       Marland Kitchen ZETIA 10 MG tablet        No current facility-administered medications for this visit.     Past Medical History  Diagnosis Date   . Hyperlipidemia   . Hypertension   . Diabetes mellitus   . Obesity   . 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 Remote angioplasty of the mid LAD in 2004.  Last myoview in 2012 felt to be low risk.   . Depression   . GERD (gastroesophageal reflux disease)   . Hypothyroidism     Past Surgical History  Procedure Laterality Date  . Cardiac catheterization  06/29/2007    EF 50-55%  . Cardiac catheterization  11/14/2002    EF 55-60%  . Cardiac catheterization  05/09/2002    EF 45%  . Cardiovascular stress test  05/23/10    EF 68%, NO ISCHEMIA  . Breast surgery  2000    lympectomy on Left Br  . Breast surgery  2005    Right Br Reduction    History   Social History  . Marital Status: Married    Spouse Name: N/A    Number of Children: N/A  . Years of Education: N/A   Occupational History  .  Not on file.   Social History Main Topics  . Smoking status: Never Smoker   . Smokeless tobacco: Not on file  . Alcohol Use: No  . Drug Use: No  . Sexual Activity:    Other Topics Concern  . Not on file   Social History Narrative  . No narrative on file    ROS: no fevers or chills, productive cough, hemoptysis, dysphasia, odynophagia, melena, hematochezia, dysuria, hematuria, rash, seizure activity, orthopnea, PND, pedal edema, claudication. Remaining systems are negative.  Physical Exam: Well-developed well-nourished in no acute distress.  Skin is warm and dry.  HEENT is normal.  Neck is supple.  Chest is clear to auscultation with normal expansion.  Cardiovascular exam is regular rate and rhythm.  Abdominal exam nontender or distended. No masses palpated. Extremities show no edema. neuro grossly intact  ECG Sinus rhythm at a rate of 65. Nonspecific ST changes.

## 2013-07-19 NOTE — Assessment & Plan Note (Signed)
Blood pressure is elevated but she states her systolic typically runs 051 to 140. Continue present medications and increase if needed. Follow blood pressure at home.

## 2013-07-19 NOTE — Assessment & Plan Note (Signed)
Continue aspirin and statin. She occasionally has a brief sharp chest pain for one to 2 seconds but no exertional symptoms. Plan nuclear study for risk stratification.

## 2013-08-22 ENCOUNTER — Ambulatory Visit (HOSPITAL_COMMUNITY): Payer: Medicare Other | Attending: Cardiovascular Disease | Admitting: Radiology

## 2013-08-22 VITALS — BP 217/78 | HR 68 | Ht 62.5 in | Wt 176.0 lb

## 2013-08-22 DIAGNOSIS — Z9861 Coronary angioplasty status: Secondary | ICD-10-CM | POA: Diagnosis not present

## 2013-08-22 DIAGNOSIS — R079 Chest pain, unspecified: Secondary | ICD-10-CM | POA: Insufficient documentation

## 2013-08-22 DIAGNOSIS — I2510 Atherosclerotic heart disease of native coronary artery without angina pectoris: Secondary | ICD-10-CM | POA: Diagnosis not present

## 2013-08-22 MED ORDER — TECHNETIUM TC 99M SESTAMIBI GENERIC - CARDIOLITE
11.0000 | Freq: Once | INTRAVENOUS | Status: AC | PRN
  Administered 2013-08-22: 11 via INTRAVENOUS

## 2013-08-22 MED ORDER — TECHNETIUM TC 99M SESTAMIBI GENERIC - CARDIOLITE
33.0000 | Freq: Once | INTRAVENOUS | Status: AC | PRN
  Administered 2013-08-22: 33 via INTRAVENOUS

## 2013-08-22 MED ORDER — REGADENOSON 0.4 MG/5ML IV SOLN
0.4000 mg | Freq: Once | INTRAVENOUS | Status: AC
  Administered 2013-08-22: 0.4 mg via INTRAVENOUS

## 2013-08-22 NOTE — Progress Notes (Signed)
[facility] [address] [place], [place] [address] [phone]    Cardiology Nuclear Med Study  Jillian Willis is a 73 y.o. female     MRN : [id number]     DOB: 1940/06/05  Procedure Date: 08/22/2013  Nuclear Med Background Indication for Stress Test:  Evaluation for Ischemia, PTCA Patency and Abnormal EKG History:  CAD, Cath (heavy calc. LM), PTCA, MPI 2012 (scar) EF 68%, COPD Cardiac Risk Factors: Strong Premature Family History - CAD, Hypertension, IDDM, and Lipids  Symptoms:  Chest Pain (last date of chest discomfort is at present time 1/10))   Nuclear Pre-Procedure Caffeine/Decaff Intake:  None> 12 hrs NPO After: 7:00pm   Lungs:  clear O2 Sat: 98% on room air. IV 0.9% NS with Angio Cath:  22g  IV Site: R Hand x 1, tolerated well IV Started by:  Irven Baltimore, RN  Chest Size (in):  40 Cup Size: C  Height: 5' 2.5" (1.588 m)  Weight:  176 lb (79.833 kg)  BMI:  Body mass index is 31.66 kg/(m^2). Tech Comments:  1/2 dose Insulin last night, no insulin or medications today. Lopressor held x 24 hrs.  Fasting CBG was 154 at 0700 today per patient. Irven Baltimore, RN.    Nuclear Med Study 1 or 2 day study: 1 day  Stress Test Type:  Carlton Adam  Reading MD: N/A  Order Authorizing Provider:  Kirk Ruths, MD  Resting Radionuclide: Technetium 37m Sestamibi  Resting Radionuclide Dose: 11.0 mCi   Stress Radionuclide:  Technetium 83m Sestamibi  Stress Radionuclide Dose: 33.0 mCi           Stress Protocol Rest HR: 68 Stress HR: 90  Rest BP: 217/78 Stress BP: 159/69  Exercise Time (min): n/a METS: n/a           Dose of Adenosine (mg):  n/a Dose of Lexiscan: 0.4 mg  Dose of Atropine (mg): n/a Dose of Dobutamine: n/a mcg/kg/min (at max HR)  Stress Test Technologist: Glade Lloyd, BS-ES  Nuclear Technologist:  Charlton Amor, CNMT     Rest Procedure:  Myocardial perfusion imaging was performed at rest 45 minutes following the intravenous  administration of Technetium 71m Sestamibi. Rest ECG: NSR with non-specific ST-T wave changes  Stress Procedure:  The patient received IV Lexiscan 0.4 mg over 15-seconds.  Technetium 81m Sestamibi injected at 30-seconds.  Quantitative spect images were obtained after a 45 minute delay.  During the infusion of Lexiscan the patient complained of SOB, cough, fatigue, neck pain and nausea.  These symptoms began to resolve in recovery.  Stress ECG: No significant change from baseline ECG  QPS Raw Data Images:  Normal; no motion artifact; normal heart/lung ratio. Stress Images:  There is decreased uptake in the apex. Rest Images:  There is decreased uptake in the apex. Subtraction (SDS):  There is a fixed defect that is most consistent with a previous infarction. Transient Ischemic Dilatation (Normal <1.22):  1.07 Lung/Heart Ratio (Normal <0.45):  0.38  Quantitative Gated Spect Images QGS EDV:  75 ml QGS ESV:  29 ml  Impression Exercise Capacity:  Lexiscan with no exercise. BP Response:  Normal blood pressure response. Clinical Symptoms:  No chest pain. ECG Impression:  No significant ST segment change suggestive of ischemia. Comparison with Prior Nuclear Study: No significant change from previous study  Overall Impression:  Low risk stress nuclear study.  There is a small anteroapical scar with minimal reversibility.  The size of the scar  appears to have decreased slightly in size since previous study of 2012.  LV Ejection Fraction: 61%.  LV Wall Motion:  NL LV Function; NL Wall Motion   Darlin Coco  MD

## 2013-09-23 DIAGNOSIS — H353190 Nonexudative age-related macular degeneration, unspecified eye, stage unspecified: Secondary | ICD-10-CM | POA: Diagnosis not present

## 2013-09-23 DIAGNOSIS — H524 Presbyopia: Secondary | ICD-10-CM | POA: Diagnosis not present

## 2013-09-23 DIAGNOSIS — H40059 Ocular hypertension, unspecified eye: Secondary | ICD-10-CM | POA: Diagnosis not present

## 2013-09-23 DIAGNOSIS — E1139 Type 2 diabetes mellitus with other diabetic ophthalmic complication: Secondary | ICD-10-CM | POA: Diagnosis not present

## 2013-09-23 DIAGNOSIS — H40019 Open angle with borderline findings, low risk, unspecified eye: Secondary | ICD-10-CM | POA: Diagnosis not present

## 2013-09-28 DIAGNOSIS — I10 Essential (primary) hypertension: Secondary | ICD-10-CM | POA: Diagnosis not present

## 2013-09-28 DIAGNOSIS — N390 Urinary tract infection, site not specified: Secondary | ICD-10-CM | POA: Diagnosis not present

## 2013-09-28 DIAGNOSIS — E789 Disorder of lipoprotein metabolism, unspecified: Secondary | ICD-10-CM | POA: Diagnosis not present

## 2013-09-28 DIAGNOSIS — E0789 Other specified disorders of thyroid: Secondary | ICD-10-CM | POA: Diagnosis not present

## 2013-09-28 DIAGNOSIS — E119 Type 2 diabetes mellitus without complications: Secondary | ICD-10-CM | POA: Diagnosis not present

## 2013-10-04 DIAGNOSIS — I10 Essential (primary) hypertension: Secondary | ICD-10-CM | POA: Diagnosis not present

## 2013-10-04 DIAGNOSIS — E0789 Other specified disorders of thyroid: Secondary | ICD-10-CM | POA: Diagnosis not present

## 2013-10-04 DIAGNOSIS — E789 Disorder of lipoprotein metabolism, unspecified: Secondary | ICD-10-CM | POA: Diagnosis not present

## 2013-10-04 DIAGNOSIS — E119 Type 2 diabetes mellitus without complications: Secondary | ICD-10-CM | POA: Diagnosis not present

## 2013-10-21 DIAGNOSIS — N3020 Other chronic cystitis without hematuria: Secondary | ICD-10-CM | POA: Diagnosis not present

## 2013-10-24 DIAGNOSIS — N3020 Other chronic cystitis without hematuria: Secondary | ICD-10-CM | POA: Diagnosis not present

## 2013-11-29 DIAGNOSIS — M7040 Prepatellar bursitis, unspecified knee: Secondary | ICD-10-CM | POA: Diagnosis not present

## 2013-12-07 DIAGNOSIS — N3020 Other chronic cystitis without hematuria: Secondary | ICD-10-CM | POA: Diagnosis not present

## 2013-12-28 DIAGNOSIS — E1165 Type 2 diabetes mellitus with hyperglycemia: Secondary | ICD-10-CM | POA: Diagnosis not present

## 2014-01-04 DIAGNOSIS — E039 Hypothyroidism, unspecified: Secondary | ICD-10-CM | POA: Diagnosis not present

## 2014-01-04 DIAGNOSIS — C50919 Malignant neoplasm of unspecified site of unspecified female breast: Secondary | ICD-10-CM | POA: Diagnosis not present

## 2014-01-04 DIAGNOSIS — Z23 Encounter for immunization: Secondary | ICD-10-CM | POA: Diagnosis not present

## 2014-01-04 DIAGNOSIS — E789 Disorder of lipoprotein metabolism, unspecified: Secondary | ICD-10-CM | POA: Diagnosis not present

## 2014-02-06 NOTE — Progress Notes (Signed)
HPI: FU CAD. She has had remote angioplasty of the mid LAD back in 2004. Last cath in 2009 showed a 30-40% left main, 60-70% ostial circumflex which was relatively small, 30-40% proximal LAD. Ejection fraction was 50-55% with mild anterior hypokinesis. Nuclear study June 2015 showed a small anteroapical scar with minimal reversibility. Ejection fraction 61%. Treated medically. Since last seen, the patient denies any dyspnea on exertion, orthopnea, PND, pedal edema, palpitations, syncope or chest pain.   Current Outpatient Prescriptions  Medication Sig Dispense Refill  . ACCU-CHEK SMARTVIEW test strip     . aspirin 81 MG tablet Take 81 mg by mouth daily.     . Chlorphen-Pyril-Phenyleph (TRIPLEX AD PO) Take 1 tablet by mouth daily.    Marland Kitchen escitalopram (LEXAPRO) 10 MG tablet Take 10 mg by mouth as needed.    . fenofibrate (TRICOR) 145 MG tablet Take 145 mg by mouth daily.    Marland Kitchen glipiZIDE (GLUCOTROL XL) 2.5 MG 24 hr tablet Take 1 tablet by mouth 2 (two) times daily.  0  . LANTUS SOLOSTAR 100 UNIT/ML injection as directed.     Marland Kitchen liothyronine (CYTOMEL) 25 MCG tablet     . losartan (COZAAR) 50 MG tablet     . metFORMIN (GLUCOPHAGE-XR) 500 MG 24 hr tablet 2 tabs po bid    . metoprolol tartrate (LOPRESSOR) 25 MG tablet     . nitrofurantoin (MACRODANTIN) 50 MG capsule Take 1 capsule by mouth as needed. Take 1 capsule, after 5 minutes if no relief take a second capsule and head for the hospital  3  . nitrofurantoin, macrocrystal-monohydrate, (MACROBID) 100 MG capsule     . nitroGLYCERIN (NITROSTAT) 0.4 MG SL tablet Place 1 tablet (0.4 mg total) under the tongue every 5 (five) minutes as needed. 25 tablet 6  . NOVOFINE 32G X 6 MM MISC   0  . rosuvastatin (CRESTOR) 10 MG tablet Take 10 mg by mouth daily.    Marland Kitchen SYNTHROID 125 MCG tablet Take 1 tablet by mouth daily.  5  . TRILIPIX 135 MG capsule Take 135 mg by mouth daily.     Marland Kitchen ZETIA 10 MG tablet      No current facility-administered medications for  this visit.     Past Medical History  Diagnosis Date  . Hyperlipidemia   . Hypertension   . Diabetes mellitus   . Obesity   . 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 Remote angioplasty of the mid LAD in 2004.  Last myoview in 2012 felt to be low risk.   . Depression   . GERD (gastroesophageal reflux disease)   . Hypothyroidism     Past Surgical History  Procedure Laterality Date  . Cardiac catheterization  06/29/2007    EF 50-55%  . Cardiac catheterization  11/14/2002    EF 55-60%  . Cardiac catheterization  05/09/2002    EF 45%  . Cardiovascular stress test  05/23/10    EF 68%, NO ISCHEMIA  . Breast surgery  2000    lympectomy on Left Br  . Breast surgery  2005    Right Br Reduction    History   Social History  . Marital Status: Married    Spouse Name: N/A    Number of Children: N/A  . Years of Education: N/A   Occupational History  . Not on file.   Social History Main Topics  .  Smoking status: Never Smoker   . Smokeless tobacco: Not on file  . Alcohol Use: No  . Drug Use: No  . Sexual Activity: Not on file   Other Topics Concern  . Not on file   Social History Narrative    ROS: no fevers or chills, productive cough, hemoptysis, dysphasia, odynophagia, melena, hematochezia, dysuria, hematuria, rash, seizure activity, orthopnea, PND, pedal edema, claudication. Remaining systems are negative.  Physical Exam: Well-developed well-nourished in no acute distress.  Skin is warm and dry.  HEENT is normal.  Neck is supple.  Chest is clear to auscultation with normal expansion.  Cardiovascular exam is regular rate and rhythm.  Abdominal exam nontender or distended. No masses palpated. Extremities show no edema. neuro grossly intact  ECG Sinus rhythm at a rate of 64. Left ventricular hypertrophy with repolarization abnormality.

## 2014-02-09 ENCOUNTER — Other Ambulatory Visit: Payer: Self-pay

## 2014-02-09 DIAGNOSIS — Z1231 Encounter for screening mammogram for malignant neoplasm of breast: Secondary | ICD-10-CM

## 2014-02-10 ENCOUNTER — Ambulatory Visit (INDEPENDENT_AMBULATORY_CARE_PROVIDER_SITE_OTHER): Payer: Medicare Other | Admitting: Cardiology

## 2014-02-10 ENCOUNTER — Encounter: Payer: Self-pay | Admitting: Cardiology

## 2014-02-10 VITALS — BP 140/80 | HR 64 | Ht 62.5 in | Wt 172.6 lb

## 2014-02-10 DIAGNOSIS — E785 Hyperlipidemia, unspecified: Secondary | ICD-10-CM | POA: Diagnosis not present

## 2014-02-10 DIAGNOSIS — I2510 Atherosclerotic heart disease of native coronary artery without angina pectoris: Secondary | ICD-10-CM | POA: Diagnosis not present

## 2014-02-10 DIAGNOSIS — I10 Essential (primary) hypertension: Secondary | ICD-10-CM | POA: Diagnosis not present

## 2014-02-10 NOTE — Assessment & Plan Note (Signed)
Continue statin. 

## 2014-02-10 NOTE — Patient Instructions (Signed)
Your physician wants you to follow-up in: ONE YEAR WITH DR CRENSHAW You will receive a reminder letter in the mail two months in advance. If you don't receive a letter, please call our office to schedule the follow-up appointment.  

## 2014-02-10 NOTE — Assessment & Plan Note (Signed)
Blood pressure controlled. Continue present medications. 

## 2014-02-10 NOTE — Assessment & Plan Note (Signed)
Continue aspirin and statin. 

## 2014-03-08 DIAGNOSIS — N3020 Other chronic cystitis without hematuria: Secondary | ICD-10-CM | POA: Diagnosis not present

## 2014-03-20 ENCOUNTER — Ambulatory Visit: Payer: Medicare Other

## 2014-04-06 ENCOUNTER — Ambulatory Visit: Payer: Medicare Other

## 2014-04-08 ENCOUNTER — Telehealth: Payer: Self-pay | Admitting: Hematology and Oncology

## 2014-04-08 NOTE — Telephone Encounter (Signed)
, °

## 2014-04-17 DIAGNOSIS — N390 Urinary tract infection, site not specified: Secondary | ICD-10-CM | POA: Diagnosis not present

## 2014-04-17 DIAGNOSIS — I10 Essential (primary) hypertension: Secondary | ICD-10-CM | POA: Diagnosis not present

## 2014-04-17 DIAGNOSIS — E1165 Type 2 diabetes mellitus with hyperglycemia: Secondary | ICD-10-CM | POA: Diagnosis not present

## 2014-04-19 ENCOUNTER — Ambulatory Visit: Payer: Medicare Other

## 2014-04-19 ENCOUNTER — Other Ambulatory Visit: Payer: Medicare Other

## 2014-04-21 ENCOUNTER — Other Ambulatory Visit: Payer: Self-pay | Admitting: *Deleted

## 2014-04-21 DIAGNOSIS — C50912 Malignant neoplasm of unspecified site of left female breast: Secondary | ICD-10-CM

## 2014-04-24 ENCOUNTER — Telehealth: Payer: Self-pay | Admitting: Hematology and Oncology

## 2014-04-24 ENCOUNTER — Ambulatory Visit (HOSPITAL_BASED_OUTPATIENT_CLINIC_OR_DEPARTMENT_OTHER): Payer: Medicare Other | Admitting: Hematology and Oncology

## 2014-04-24 ENCOUNTER — Other Ambulatory Visit (HOSPITAL_BASED_OUTPATIENT_CLINIC_OR_DEPARTMENT_OTHER): Payer: Medicare Other

## 2014-04-24 DIAGNOSIS — Z853 Personal history of malignant neoplasm of breast: Secondary | ICD-10-CM

## 2014-04-24 DIAGNOSIS — C50912 Malignant neoplasm of unspecified site of left female breast: Secondary | ICD-10-CM

## 2014-04-24 LAB — COMPREHENSIVE METABOLIC PANEL (CC13)
ALBUMIN: 3.5 g/dL (ref 3.5–5.0)
ALK PHOS: 53 U/L (ref 40–150)
ALT: 24 U/L (ref 0–55)
AST: 27 U/L (ref 5–34)
Anion Gap: 12 mEq/L — ABNORMAL HIGH (ref 3–11)
BUN: 21.3 mg/dL (ref 7.0–26.0)
CALCIUM: 8.8 mg/dL (ref 8.4–10.4)
CO2: 24 meq/L (ref 22–29)
CREATININE: 1 mg/dL (ref 0.6–1.1)
Chloride: 107 mEq/L (ref 98–109)
EGFR: 57 mL/min/{1.73_m2} — ABNORMAL LOW (ref >90)
Glucose: 156 mg/dl — ABNORMAL HIGH (ref 70–140)
Potassium: 3.8 mEq/L (ref 3.5–5.1)
SODIUM: 143 meq/L (ref 136–145)
Total Bilirubin: 0.5 mg/dL (ref 0.20–1.20)
Total Protein: 6.8 g/dL (ref 6.4–8.3)

## 2014-04-24 LAB — CBC WITH DIFFERENTIAL/PLATELET
BASO%: 0.7 % (ref 0.0–2.0)
BASOS ABS: 0 10*3/uL (ref 0.0–0.1)
EOS%: 3.4 % (ref 0.0–7.0)
Eosinophils Absolute: 0.2 10*3/uL (ref 0.0–0.5)
HCT: 39.8 % (ref 34.8–46.6)
HGB: 12.9 g/dL (ref 11.6–15.9)
LYMPH#: 1.6 10*3/uL (ref 0.9–3.3)
LYMPH%: 34.9 % (ref 14.0–49.7)
MCH: 30 pg (ref 25.1–34.0)
MCHC: 32.4 g/dL (ref 31.5–36.0)
MCV: 92.8 fL (ref 79.5–101.0)
MONO#: 0.3 10*3/uL (ref 0.1–0.9)
MONO%: 6.7 % (ref 0.0–14.0)
NEUT%: 54.3 % (ref 38.4–76.8)
NEUTROS ABS: 2.4 10*3/uL (ref 1.5–6.5)
PLATELETS: 171 10*3/uL (ref 145–400)
RBC: 4.29 10*6/uL (ref 3.70–5.45)
RDW: 13.4 % (ref 11.2–14.5)
WBC: 4.5 10*3/uL (ref 3.9–10.3)

## 2014-04-24 NOTE — Assessment & Plan Note (Signed)
Left breast cancer diagnosed November 2000, two synchronous primaries both were ER/PR negative however one was HER-2 positive, 3 mm focus in 1 sentinel node, 13 lymph nodes negative status post lumpectomy followed by 4 cycles Adriamycin and Cytoxan followed by radiation, reductive mammoplasty right breast 03/21/2004  Breast cancer surveillance: 1. Mammograms to be done 04/25/2014 2. Breast exam done 04/24/2014 is normal  Patient had lost overall 100 pounds by dieting. Return to clinic in 1 year for follow-up and breast exams.

## 2014-04-24 NOTE — Progress Notes (Signed)
Patient Care Team: Anda Kraft, MD as PCP - General (Endocrinology)  DIAGNOSIS: Left breast cancer  SUMMARY OF ONCOLOGIC HISTORY: PATIENT IDENTIFICATION: 1. Cancer of the left breast dating back to November 2000, with 2 synchronous small undifferentiated cancers located in the upper inner quadrant of the left breast. Both tumors were hormone receptor negative with high proliferative fractions. One of the tumors was HER-2 three plus positive. There was a small 3 mm focus of tumor in 1 of the sentinel lymph nodes. The remaining 13 lymph nodes were negative. Following a local excision Jillian Willis completed 4 cycles of adjuvant chemotherapy with Cytoxan and Adriamycin. She then underwent radiation treatments to the left breast, which were completed in June of 2001. The patient has remained disease-free since that time without recurrence.  2. 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  3. Reductive mammoplasty on the right breast on 03/21/2004 by Dr. Erline Hau.  4. Morbid obesity.  5. Hypertension.  6. Diabetes mellitus type 2.  7. GERD. 8. Primary esophageal dysphagia per Barium swallow exam on 06/06/11 9. History of hypothyroidism.  10. History of depression.  11.Dyslipidemia.  12.Stress and urge incontinence. 16. Ischemic Heart Disease (IHD), s/p multiple cardiac catheterizations on 05/09/2012, 11/14/2002, 06/29/2007. Last stress test was in 2012 and was felt to be low risk. There was an old anteroapical scar with minimal reversible ischemia. It was unchanged from her study of 2009  CHIEF COMPLIANT: Follow-up of breast cancer  INTERVAL HISTORY: Jillian Willis is a 74 year old lady with above-mentioned history of left-sided breast cancer treated with lumpectomy followed by adjuvant chemotherapy and radiation and is currently in observation for ER/PR negative and separate tumor was ER/PR negative and HER-2 positive. She has not received her to directed therapy. She is in  remission without any problems or concerns. She had lost over 100 pounds by eating properly. She reduce her consumption of past [place] and refined carbohydrates. She walks for exercise.  REVIEW OF SYSTEMS:   Constitutional: Denies fevers, chills or abnormal weight loss Eyes: Denies blurriness of vision Ears, nose, mouth, throat, and face: Denies mucositis or sore throat Respiratory: Denies cough, dyspnea or wheezes Cardiovascular: Denies palpitation, chest discomfort or lower extremity swelling Gastrointestinal:  Denies nausea, heartburn or change in bowel habits Skin: Denies abnormal skin rashes Lymphatics: Denies new lymphadenopathy or easy bruising Neurological:Denies numbness, tingling or new weaknesses Behavioral/Psych: Mood is stable, no new changes  Breast:  denies any pain or lumps or nodules in either breasts All other systems were reviewed with the patient and are negative.  I have reviewed the past medical history, past surgical history, social history and family history with the patient and they are unchanged from previous note.  ALLERGIES:  is allergic to peanut oil; ciprofloxacin; and codeine.  MEDICATIONS:  Current Outpatient Prescriptions  Medication Sig Dispense Refill  . ACCU-CHEK SMARTVIEW test strip     . aspirin 81 MG tablet Take 81 mg by mouth daily.     . Chlorphen-Pyril-Phenyleph (TRIPLEX AD PO) Take 1 tablet by mouth daily.    Marland Kitchen escitalopram (LEXAPRO) 10 MG tablet Take 10 mg by mouth as needed.    . fenofibrate (TRICOR) 145 MG tablet Take 145 mg by mouth daily.    Marland Kitchen glipiZIDE (GLUCOTROL XL) 2.5 MG 24 hr tablet Take 1 tablet by mouth 2 (two) times daily.  0  . LANTUS SOLOSTAR 100 UNIT/ML injection as directed.     Marland Kitchen liothyronine (CYTOMEL) 25 MCG tablet     .  losartan (COZAAR) 50 MG tablet     . metFORMIN (GLUCOPHAGE-XR) 500 MG 24 hr tablet 2 tabs po bid    . metoprolol tartrate (LOPRESSOR) 25 MG tablet     . nitrofurantoin (MACRODANTIN) 50 MG capsule Take 1  capsule by mouth as needed. Take 1 capsule, after 5 minutes if no relief take a second capsule and head for the hospital  3  . nitrofurantoin, macrocrystal-monohydrate, (MACROBID) 100 MG capsule     . nitroGLYCERIN (NITROSTAT) 0.4 MG SL tablet Place 1 tablet (0.4 mg total) under the tongue every 5 (five) minutes as needed. 25 tablet 6  . NOVOFINE 32G X 6 MM MISC   0  . rosuvastatin (CRESTOR) 10 MG tablet Take 10 mg by mouth daily.    Marland Kitchen SYNTHROID 125 MCG tablet Take 1 tablet by mouth daily.  5  . TRILIPIX 135 MG capsule Take 135 mg by mouth daily.     Marland Kitchen ZETIA 10 MG tablet      No current facility-administered medications for this visit.    PHYSICAL EXAMINATION: ECOG PERFORMANCE STATUS: 0 - Asymptomatic  Filed Vitals:   04/24/14 0959  BP: 221/82  Pulse: 70  Temp: 97.5 F (36.4 C)  Resp: 20   Filed Weights   04/24/14 0959  Weight: 173 lb (78.472 kg)    GENERAL:alert, no distress and comfortable SKIN: skin color, texture, turgor are normal, no rashes or significant lesions EYES: normal, Conjunctiva are pink and non-injected, sclera clear OROPHARYNX:no exudate, no erythema and lips, buccal mucosa, and tongue normal  NECK: supple, thyroid normal size, non-tender, without nodularity LYMPH:  no palpable lymphadenopathy in the cervical, axillary or inguinal LUNGS: clear to auscultation and percussion with normal breathing effort HEART: regular rate & rhythm and no murmurs and no lower extremity edema ABDOMEN:abdomen soft, non-tender and normal bowel sounds Musculoskeletal:no cyanosis of digits and no clubbing  NEURO: alert & oriented x 3 with fluent speech, no focal motor/sensory deficits BREAST: No palpable masses or nodules in either right or left breasts. No palpable axillary supraclavicular or infraclavicular adenopathy no breast tenderness or nipple discharge. (exam performed in the presence of a chaperone)  LABORATORY DATA:  I have reviewed the data as listed   Chemistry       Component Value Date/Time   NA 143 04/24/2014 0946   NA 136 04/21/2011 1405   K 3.8 04/24/2014 0946   K 4.1 04/21/2011 1405   CL 100 04/22/2012 0902   CL 101 04/21/2011 1405   CO2 24 04/24/2014 0946   CO2 24 04/21/2011 1405   BUN 21.3 04/24/2014 0946   BUN 20 04/21/2011 1405   CREATININE 1.0 04/24/2014 0946   CREATININE 1.03 04/21/2011 1405      Component Value Date/Time   CALCIUM 8.8 04/24/2014 0946   CALCIUM 9.1 04/21/2011 1405   ALKPHOS 53 04/24/2014 0946   ALKPHOS 34* 04/21/2011 1405   AST 27 04/24/2014 0946   AST 31 04/21/2011 1405   ALT 24 04/24/2014 0946   ALT 27 04/21/2011 1405   BILITOT 0.50 04/24/2014 0946   BILITOT 0.4 04/21/2011 1405       Lab Results  Component Value Date   WBC 4.5 04/24/2014   HGB 12.9 04/24/2014   HCT 39.8 04/24/2014   MCV 92.8 04/24/2014   PLT 171 04/24/2014   NEUTROABS 2.4 04/24/2014   ASSESSMENT & PLAN:  Breast cancer, left breast Left breast cancer diagnosed November 2000, two synchronous primaries both were ER/PR negative however one  was HER-2 positive, 3 mm focus in 1 sentinel node, 13 lymph nodes negative status post lumpectomy followed by 4 cycles Adriamycin and Cytoxan followed by radiation, reductive mammoplasty right breast 03/21/2004  Breast cancer surveillance: 1. Mammograms to be done 04/25/2014 2. Breast exam done 04/24/2014 is normal  Patient had lost overall 100 pounds by dieting. Return to clinic in 1 year for follow-up and breast exams.    Orders Placed This Encounter  Procedures  . CBC with Differential    Standing Status: Future     Number of Occurrences:      Standing Expiration Date: 04/24/2015  . Comprehensive metabolic panel (Cmet) - [facility]    Standing Status: Future     Number of Occurrences:      Standing Expiration Date: 04/24/2015   The patient has a good understanding of the overall plan. she agrees with it. She will call with any problems that may develop before her next visit here.   Rulon Eisenmenger, MD

## 2014-04-24 NOTE — Telephone Encounter (Signed)
, °

## 2014-04-25 ENCOUNTER — Ambulatory Visit
Admission: RE | Admit: 2014-04-25 | Discharge: 2014-04-25 | Disposition: A | Discharge status: Home / Self Care | Payer: Medicare Other | Source: Ambulatory Visit

## 2014-04-25 DIAGNOSIS — Z1231 Encounter for screening mammogram for malignant neoplasm of breast: Secondary | ICD-10-CM

## 2014-04-28 ENCOUNTER — Encounter: Payer: Self-pay | Admitting: Internal Medicine

## 2014-05-03 DIAGNOSIS — E780 Pure hypercholesterolemia: Secondary | ICD-10-CM | POA: Diagnosis not present

## 2014-05-03 DIAGNOSIS — E118 Type 2 diabetes mellitus with unspecified complications: Secondary | ICD-10-CM | POA: Diagnosis not present

## 2014-05-09 DIAGNOSIS — E032 Hypothyroidism due to medicaments and other exogenous substances: Secondary | ICD-10-CM | POA: Diagnosis not present

## 2014-05-09 DIAGNOSIS — I10 Essential (primary) hypertension: Secondary | ICD-10-CM | POA: Diagnosis not present

## 2014-05-09 DIAGNOSIS — E789 Disorder of lipoprotein metabolism, unspecified: Secondary | ICD-10-CM | POA: Diagnosis not present

## 2014-05-12 DIAGNOSIS — H40013 Open angle with borderline findings, low risk, bilateral: Secondary | ICD-10-CM | POA: Diagnosis not present

## 2014-05-16 DIAGNOSIS — D709 Neutropenia, unspecified: Secondary | ICD-10-CM | POA: Diagnosis not present

## 2014-05-24 DIAGNOSIS — H903 Sensorineural hearing loss, bilateral: Secondary | ICD-10-CM | POA: Diagnosis not present

## 2014-05-26 DIAGNOSIS — L0212 Furuncle of neck: Secondary | ICD-10-CM | POA: Diagnosis not present

## 2014-05-26 DIAGNOSIS — L720 Epidermal cyst: Secondary | ICD-10-CM | POA: Diagnosis not present

## 2014-06-05 DIAGNOSIS — E118 Type 2 diabetes mellitus with unspecified complications: Secondary | ICD-10-CM | POA: Diagnosis not present

## 2014-06-05 DIAGNOSIS — E039 Hypothyroidism, unspecified: Secondary | ICD-10-CM | POA: Diagnosis not present

## 2014-06-05 DIAGNOSIS — E780 Pure hypercholesterolemia: Secondary | ICD-10-CM | POA: Diagnosis not present

## 2014-06-12 DIAGNOSIS — E118 Type 2 diabetes mellitus with unspecified complications: Secondary | ICD-10-CM | POA: Diagnosis not present

## 2014-06-12 DIAGNOSIS — E032 Hypothyroidism due to medicaments and other exogenous substances: Secondary | ICD-10-CM | POA: Diagnosis not present

## 2014-06-12 DIAGNOSIS — I10 Essential (primary) hypertension: Secondary | ICD-10-CM | POA: Diagnosis not present

## 2014-06-12 DIAGNOSIS — E789 Disorder of lipoprotein metabolism, unspecified: Secondary | ICD-10-CM | POA: Diagnosis not present

## 2014-10-02 DIAGNOSIS — E039 Hypothyroidism, unspecified: Secondary | ICD-10-CM | POA: Diagnosis not present

## 2014-10-02 DIAGNOSIS — N390 Urinary tract infection, site not specified: Secondary | ICD-10-CM | POA: Diagnosis not present

## 2014-10-02 DIAGNOSIS — E118 Type 2 diabetes mellitus with unspecified complications: Secondary | ICD-10-CM | POA: Diagnosis not present

## 2014-10-02 DIAGNOSIS — I10 Essential (primary) hypertension: Secondary | ICD-10-CM | POA: Diagnosis not present

## 2014-10-02 DIAGNOSIS — E0789 Other specified disorders of thyroid: Secondary | ICD-10-CM | POA: Diagnosis not present

## 2014-10-09 DIAGNOSIS — E789 Disorder of lipoprotein metabolism, unspecified: Secondary | ICD-10-CM | POA: Diagnosis not present

## 2014-10-09 DIAGNOSIS — E118 Type 2 diabetes mellitus with unspecified complications: Secondary | ICD-10-CM | POA: Diagnosis not present

## 2014-10-09 DIAGNOSIS — C50919 Malignant neoplasm of unspecified site of unspecified female breast: Secondary | ICD-10-CM | POA: Diagnosis not present

## 2014-10-09 DIAGNOSIS — I10 Essential (primary) hypertension: Secondary | ICD-10-CM | POA: Diagnosis not present

## 2014-10-11 DIAGNOSIS — R197 Diarrhea, unspecified: Secondary | ICD-10-CM | POA: Diagnosis not present

## 2014-10-11 DIAGNOSIS — Z86010 Personal history of colonic polyps: Secondary | ICD-10-CM | POA: Diagnosis not present

## 2014-10-11 DIAGNOSIS — E119 Type 2 diabetes mellitus without complications: Secondary | ICD-10-CM | POA: Diagnosis not present

## 2014-10-31 DIAGNOSIS — Z86010 Personal history of colonic polyps: Secondary | ICD-10-CM | POA: Diagnosis not present

## 2014-10-31 DIAGNOSIS — D123 Benign neoplasm of transverse colon: Secondary | ICD-10-CM | POA: Diagnosis not present

## 2014-10-31 DIAGNOSIS — Z1211 Encounter for screening for malignant neoplasm of colon: Secondary | ICD-10-CM | POA: Diagnosis not present

## 2014-10-31 DIAGNOSIS — K635 Polyp of colon: Secondary | ICD-10-CM | POA: Diagnosis not present

## 2014-11-20 DIAGNOSIS — R252 Cramp and spasm: Secondary | ICD-10-CM | POA: Diagnosis not present

## 2014-11-20 DIAGNOSIS — E559 Vitamin D deficiency, unspecified: Secondary | ICD-10-CM | POA: Diagnosis not present

## 2014-11-20 DIAGNOSIS — E118 Type 2 diabetes mellitus with unspecified complications: Secondary | ICD-10-CM | POA: Diagnosis not present

## 2014-11-20 DIAGNOSIS — I10 Essential (primary) hypertension: Secondary | ICD-10-CM | POA: Diagnosis not present

## 2015-01-03 DIAGNOSIS — H40013 Open angle with borderline findings, low risk, bilateral: Secondary | ICD-10-CM | POA: Diagnosis not present

## 2015-01-10 DIAGNOSIS — Z23 Encounter for immunization: Secondary | ICD-10-CM | POA: Diagnosis not present

## 2015-01-16 DIAGNOSIS — H353131 Nonexudative age-related macular degeneration, bilateral, early dry stage: Secondary | ICD-10-CM | POA: Diagnosis not present

## 2015-01-16 DIAGNOSIS — H35361 Drusen (degenerative) of macula, right eye: Secondary | ICD-10-CM | POA: Diagnosis not present

## 2015-01-16 DIAGNOSIS — E113392 Type 2 diabetes mellitus with moderate nonproliferative diabetic retinopathy without macular edema, left eye: Secondary | ICD-10-CM | POA: Diagnosis not present

## 2015-01-16 DIAGNOSIS — E113491 Type 2 diabetes mellitus with severe nonproliferative diabetic retinopathy without macular edema, right eye: Secondary | ICD-10-CM | POA: Diagnosis not present

## 2015-01-23 DIAGNOSIS — S83242A Other tear of medial meniscus, current injury, left knee, initial encounter: Secondary | ICD-10-CM | POA: Diagnosis not present

## 2015-01-23 DIAGNOSIS — M545 Low back pain: Secondary | ICD-10-CM | POA: Diagnosis not present

## 2015-02-14 ENCOUNTER — Encounter: Payer: Self-pay | Admitting: *Deleted

## 2015-02-14 DIAGNOSIS — E118 Type 2 diabetes mellitus with unspecified complications: Secondary | ICD-10-CM | POA: Diagnosis not present

## 2015-02-19 DIAGNOSIS — F439 Reaction to severe stress, unspecified: Secondary | ICD-10-CM | POA: Diagnosis not present

## 2015-02-19 DIAGNOSIS — E789 Disorder of lipoprotein metabolism, unspecified: Secondary | ICD-10-CM | POA: Diagnosis not present

## 2015-02-19 DIAGNOSIS — E118 Type 2 diabetes mellitus with unspecified complications: Secondary | ICD-10-CM | POA: Diagnosis not present

## 2015-02-19 NOTE — Progress Notes (Signed)
HPI: FU CAD. She has had remote angioplasty of the mid LAD back in 2004. Last cath in 2009 showed a 30-40% left main, 60-70% ostial circumflex which was relatively small, 30-40% proximal LAD. Ejection fraction was 50-55% with mild anterior hypokinesis. Nuclear study June 2015 showed a small anteroapical scar with minimal reversibility. Ejection fraction 61%. Treated medically. Since last seen, the patient denies any dyspnea on exertion, orthopnea, PND, pedal edema, palpitations, syncope or chest pain.   Current Outpatient Prescriptions  Medication Sig Dispense Refill  . ACCU-CHEK SMARTVIEW test strip     . aspirin 81 MG tablet Take 81 mg by mouth daily.     . Chlorphen-Pyril-Phenyleph (TRIPLEX AD PO) Take 1 tablet by mouth daily.    . diclofenac sodium (VOLTAREN) 1 % GEL Apply 2 g topically daily. Apply 2-4 grams to affected area 4 times a day  1  . escitalopram (LEXAPRO) 10 MG tablet Take 10 mg by mouth as needed.    . fenofibrate (TRICOR) 145 MG tablet Take 145 mg by mouth daily.    Marland Kitchen glipiZIDE (GLUCOTROL XL) 2.5 MG 24 hr tablet Take 1 tablet by mouth 2 (two) times daily.  0  . LANTUS SOLOSTAR 100 UNIT/ML injection as directed.     Marland Kitchen liothyronine (CYTOMEL) 25 MCG tablet     . losartan (COZAAR) 50 MG tablet     . metFORMIN (GLUCOPHAGE-XR) 500 MG 24 hr tablet 2 tabs po bid    . metoprolol tartrate (LOPRESSOR) 25 MG tablet     . nitrofurantoin (MACRODANTIN) 50 MG capsule Take 1 capsule by mouth as needed. Take 1 capsule, after 5 minutes if no relief take a second capsule and head for the hospital  3  . nitrofurantoin, macrocrystal-monohydrate, (MACROBID) 100 MG capsule     . nitroGLYCERIN (NITROSTAT) 0.4 MG SL tablet Place 1 tablet (0.4 mg total) under the tongue every 5 (five) minutes as needed. 25 tablet 6  . NOVOFINE 32G X 6 MM MISC   0  . rosuvastatin (CRESTOR) 10 MG tablet Take 10 mg by mouth daily.    Marland Kitchen SYNTHROID 125 MCG tablet Take 1 tablet by mouth daily.  5  . TRILIPIX 135 MG  capsule Take 135 mg by mouth daily.     Marland Kitchen ZETIA 10 MG tablet      No current facility-administered medications for this visit.     Past Medical History  Diagnosis Date  . Hyperlipidemia   . Hypertension   . Diabetes mellitus   . Obesity   . 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 Remote angioplasty of the mid LAD in 2004.  Last myoview in 2012 felt to be low risk.   . Depression   . GERD (gastroesophageal reflux disease)   . Hypothyroidism     Past Surgical History  Procedure Laterality Date  . Cardiac catheterization  06/29/2007    EF 50-55%  . Cardiac catheterization  11/14/2002    EF 55-60%  . Cardiac catheterization  05/09/2002    EF 45%  . Cardiovascular stress test  05/23/10    EF 68%, NO ISCHEMIA  . Breast surgery  2000    lympectomy on Left Br  . Breast surgery  2005    Right Br Reduction    Social History   Social History  . Marital Status: Married    Spouse Name: N/A  .  Number of Children: N/A  . Years of Education: N/A   Occupational History  . Not on file.   Social History Main Topics  . Smoking status: Never Smoker   . Smokeless tobacco: Not on file  . Alcohol Use: No  . Drug Use: No  . Sexual Activity: Not on file   Other Topics Concern  . Not on file   Social History Narrative    ROS: no fevers or chills, productive cough, hemoptysis, dysphasia, odynophagia, melena, hematochezia, dysuria, hematuria, rash, seizure activity, orthopnea, PND, pedal edema, claudication. Remaining systems are negative.  Physical Exam: Well-developed well-nourished in no acute distress.  Skin is warm and dry.  HEENT is normal.  Neck is supple.  Chest is clear to auscultation with normal expansion.  Cardiovascular exam is regular rate and rhythm.  Abdominal exam nontender or distended. No masses palpated. Extremities show no edema. neuro grossly intact  ECG Sinus rhythm at a rate  of 63. Nonspecific T-wave changes.

## 2015-02-21 ENCOUNTER — Encounter: Payer: Self-pay | Admitting: Cardiology

## 2015-02-21 ENCOUNTER — Ambulatory Visit (INDEPENDENT_AMBULATORY_CARE_PROVIDER_SITE_OTHER): Payer: Medicare Other | Admitting: Cardiology

## 2015-02-21 VITALS — BP 140/78 | HR 63 | Ht 62.0 in | Wt 181.5 lb

## 2015-02-21 DIAGNOSIS — E785 Hyperlipidemia, unspecified: Secondary | ICD-10-CM

## 2015-02-21 DIAGNOSIS — I10 Essential (primary) hypertension: Secondary | ICD-10-CM

## 2015-02-21 DIAGNOSIS — I2510 Atherosclerotic heart disease of native coronary artery without angina pectoris: Secondary | ICD-10-CM

## 2015-02-21 NOTE — Assessment & Plan Note (Signed)
Continue aspirin and statin. 

## 2015-02-21 NOTE — Assessment & Plan Note (Signed)
Continue statin. Lipids and liver monitored by primary care. 

## 2015-02-21 NOTE — Assessment & Plan Note (Signed)
Blood pressure controlled. Continue present medications. 

## 2015-02-21 NOTE — Patient Instructions (Signed)
Your physician wants you to follow-up in: ONE YEAR WITH DR CRENSHAW You will receive a reminder letter in the mail two months in advance. If you don't receive a letter, please call our office to schedule the follow-up appointment.   If you need a refill on your cardiac medications before your next appointment, please call your pharmacy.  

## 2015-03-22 ENCOUNTER — Other Ambulatory Visit: Payer: Self-pay

## 2015-03-22 DIAGNOSIS — Z1231 Encounter for screening mammogram for malignant neoplasm of breast: Secondary | ICD-10-CM

## 2015-04-03 DIAGNOSIS — E032 Hypothyroidism due to medicaments and other exogenous substances: Secondary | ICD-10-CM | POA: Diagnosis not present

## 2015-04-03 DIAGNOSIS — L249 Irritant contact dermatitis, unspecified cause: Secondary | ICD-10-CM | POA: Diagnosis not present

## 2015-04-03 DIAGNOSIS — E118 Type 2 diabetes mellitus with unspecified complications: Secondary | ICD-10-CM | POA: Diagnosis not present

## 2015-04-26 ENCOUNTER — Other Ambulatory Visit: Payer: Medicare Other

## 2015-04-26 ENCOUNTER — Ambulatory Visit: Payer: Medicare Other | Admitting: Hematology and Oncology

## 2015-05-09 ENCOUNTER — Ambulatory Visit
Admission: RE | Admit: 2015-05-09 | Discharge: 2015-05-09 | Disposition: A | Discharge status: Home / Self Care | Payer: Medicare Other | Source: Ambulatory Visit

## 2015-05-09 DIAGNOSIS — Z1231 Encounter for screening mammogram for malignant neoplasm of breast: Secondary | ICD-10-CM | POA: Diagnosis not present

## 2015-05-15 ENCOUNTER — Other Ambulatory Visit: Payer: Self-pay

## 2015-05-15 DIAGNOSIS — E118 Type 2 diabetes mellitus with unspecified complications: Secondary | ICD-10-CM | POA: Diagnosis not present

## 2015-05-15 DIAGNOSIS — E7800 Pure hypercholesterolemia, unspecified: Secondary | ICD-10-CM | POA: Diagnosis not present

## 2015-05-15 DIAGNOSIS — C50212 Malignant neoplasm of upper-inner quadrant of left female breast: Secondary | ICD-10-CM

## 2015-05-15 NOTE — Assessment & Plan Note (Signed)
Left breast cancer diagnosed November 2000, two synchronous primaries both were ER/PR negative however one was HER-2 positive, 3 mm focus in 1 sentinel node, 13 lymph nodes negative status post lumpectomy followed by 4 cycles Adriamycin and Cytoxan followed by radiation, reductive mammoplasty right breast 03/21/2004  Breast cancer surveillance: 1. Mammograms 05/10/15: No abnormalities, density cat B 2. Breast exam done 05/16/2015 is normal  Patient had lost overall 100 pounds by dieting. Return to clinic in 1 year for follow-up and breast exams.

## 2015-05-16 ENCOUNTER — Encounter: Payer: Self-pay | Admitting: Hematology and Oncology

## 2015-05-16 ENCOUNTER — Ambulatory Visit (HOSPITAL_BASED_OUTPATIENT_CLINIC_OR_DEPARTMENT_OTHER): Payer: Medicare Other | Admitting: Hematology and Oncology

## 2015-05-16 ENCOUNTER — Other Ambulatory Visit (HOSPITAL_BASED_OUTPATIENT_CLINIC_OR_DEPARTMENT_OTHER): Payer: Medicare Other

## 2015-05-16 ENCOUNTER — Telehealth: Payer: Self-pay | Admitting: Hematology and Oncology

## 2015-05-16 VITALS — BP 193/66 | HR 64 | Temp 97.5°F | Resp 18 | Wt 183.5 lb

## 2015-05-16 DIAGNOSIS — Z853 Personal history of malignant neoplasm of breast: Secondary | ICD-10-CM

## 2015-05-16 DIAGNOSIS — C50412 Malignant neoplasm of upper-outer quadrant of left female breast: Secondary | ICD-10-CM

## 2015-05-16 DIAGNOSIS — C50212 Malignant neoplasm of upper-inner quadrant of left female breast: Secondary | ICD-10-CM

## 2015-05-16 LAB — COMPREHENSIVE METABOLIC PANEL
ALT: 23 U/L (ref 0–55)
AST: 31 U/L (ref 5–34)
Albumin: 3.4 g/dL — ABNORMAL LOW (ref 3.5–5.0)
Alkaline Phosphatase: 31 U/L — ABNORMAL LOW (ref 40–150)
Anion Gap: 9 mEq/L (ref 3–11)
BILIRUBIN TOTAL: 0.4 mg/dL (ref 0.20–1.20)
BUN: 27.8 mg/dL — ABNORMAL HIGH (ref 7.0–26.0)
CO2: 25 meq/L (ref 22–29)
Calcium: 9.3 mg/dL (ref 8.4–10.4)
Chloride: 104 mEq/L (ref 98–109)
Creatinine: 1.4 mg/dL — ABNORMAL HIGH (ref 0.6–1.1)
EGFR: 37 mL/min/{1.73_m2} — AB (ref >90)
Glucose: 220 mg/dl — ABNORMAL HIGH (ref 70–140)
Potassium: 4.5 mEq/L (ref 3.5–5.1)
Sodium: 139 mEq/L (ref 136–145)
TOTAL PROTEIN: 6.9 g/dL (ref 6.4–8.3)

## 2015-05-16 LAB — CBC WITH DIFFERENTIAL/PLATELET
BASO%: 0.9 % (ref 0.0–2.0)
Basophils Absolute: 0 10*3/uL (ref 0.0–0.1)
EOS%: 2.1 % (ref 0.0–7.0)
Eosinophils Absolute: 0.1 10*3/uL (ref 0.0–0.5)
HCT: 36.9 % (ref 34.8–46.6)
HGB: 12.3 g/dL (ref 11.6–15.9)
LYMPH%: 32 % (ref 14.0–49.7)
MCH: 31.8 pg (ref 25.1–34.0)
MCHC: 33.3 g/dL (ref 31.5–36.0)
MCV: 95.4 fL (ref 79.5–101.0)
MONO#: 0.4 10*3/uL (ref 0.1–0.9)
MONO%: 8.5 % (ref 0.0–14.0)
NEUT%: 56.5 % (ref 38.4–76.8)
NEUTROS ABS: 2.7 10*3/uL (ref 1.5–6.5)
Platelets: 181 10*3/uL (ref 145–400)
RBC: 3.87 10*6/uL (ref 3.70–5.45)
RDW: 13.4 % (ref 11.2–14.5)
WBC: 4.8 10*3/uL (ref 3.9–10.3)
lymph#: 1.5 10*3/uL (ref 0.9–3.3)

## 2015-05-16 NOTE — Progress Notes (Signed)
Patient Care Team: Anda Kraft, MD as PCP - General (Endocrinology)  1. Cancer of the left breast dating back to November 2000, with 2 synchronous small undifferentiated cancers located in the upper inner quadrant of the left breast. Both tumors were hormone receptor negative with high proliferative fractions. One of the tumors was HER-2 three plus positive. There was a small 3 mm focus of tumor in 1 of the sentinel lymph nodes. The remaining 13 lymph nodes were negative. Following a local excision Jillian Willis completed 4 cycles of adjuvant chemotherapy with Cytoxan and Adriamycin. She then underwent radiation treatments to the left breast, which were completed in June of 2001. The patient has remained disease-free since that time without recurrence.  2. 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  3. Reductive mammoplasty on the right breast on 03/21/2004 by Dr. Erline Hau.  4. Morbid obesity.  5. Hypertension.  6. Diabetes mellitus type 2.  7. GERD. 8. Primary esophageal dysphagia per Barium swallow exam on 06/06/11 9. History of hypothyroidism.  10. History of depression.  11.Dyslipidemia.  12.Stress and urge incontinence. 61. Ischemic Heart Disease (IHD), s/p multiple cardiac catheterizations on 05/09/2012, 11/14/2002, 06/29/2007. Last stress test was in 2012 and was felt to be low risk. There was an old anteroapical scar with minimal reversible ischemia. It was unchanged from her study of 2009  CHIEF COMPLIANT: Follow-up of left breast cancer  INTERVAL HISTORY: Jillian Willis is a 75 year old with above-mentioned history of left breast cancer treated with surgery followed by adjuvant chemotherapy. She is here for annual follow-up. She reports no new problems or concerns. Denies any lumps or nodules in the breasts.  REVIEW OF SYSTEMS:   Constitutional: Denies fevers, chills or abnormal weight loss Eyes: Denies blurriness of vision Ears, nose, mouth, throat,  and face: Denies mucositis or sore throat Respiratory: Denies cough, dyspnea or wheezes Cardiovascular: Denies palpitation, chest discomfort Gastrointestinal:  Denies nausea, heartburn or change in bowel habits Skin: Denies abnormal skin rashes Lymphatics: Denies new lymphadenopathy or easy bruising Neurological:Denies numbness, tingling or new weaknesses Behavioral/Psych: Mood is stable, no new changes  Extremities: No lower extremity edema Breast:  denies any pain or lumps or nodules in either breasts All other systems were reviewed with the patient and are negative.  I have reviewed the past medical history, past surgical history, social history and family history with the patient and they are unchanged from previous note.  ALLERGIES:  is allergic to peanut oil; ciprofloxacin; and codeine.  MEDICATIONS:  Current Outpatient Prescriptions  Medication Sig Dispense Refill  . ACCU-CHEK SMARTVIEW test strip     . aspirin 81 MG tablet Take 81 mg by mouth daily.     . Chlorphen-Pyril-Phenyleph (TRIPLEX AD PO) Take 1 tablet by mouth daily.    . diclofenac sodium (VOLTAREN) 1 % GEL Apply 2 g topically daily. Apply 2-4 grams to affected area 4 times a day  1  . escitalopram (LEXAPRO) 10 MG tablet Take 10 mg by mouth as needed.    . fenofibrate (TRICOR) 145 MG tablet Take 145 mg by mouth daily.    Marland Kitchen glipiZIDE (GLUCOTROL XL) 2.5 MG 24 hr tablet Take 1 tablet by mouth 2 (two) times daily.  0  . LANTUS SOLOSTAR 100 UNIT/ML injection as directed.     Marland Kitchen liothyronine (CYTOMEL) 25 MCG tablet     . losartan (COZAAR) 50 MG tablet     . metFORMIN (GLUCOPHAGE-XR) 500 MG 24 hr tablet 2 tabs po bid    .  metoprolol tartrate (LOPRESSOR) 25 MG tablet     . nitrofurantoin (MACRODANTIN) 50 MG capsule Take 1 capsule by mouth as needed. Take 1 capsule, after 5 minutes if no relief take a second capsule and head for the hospital  3  . nitrofurantoin, macrocrystal-monohydrate, (MACROBID) 100 MG capsule     .  nitroGLYCERIN (NITROSTAT) 0.4 MG SL tablet Place 1 tablet (0.4 mg total) under the tongue every 5 (five) minutes as needed. 25 tablet 6  . NOVOFINE 32G X 6 MM MISC   0  . rosuvastatin (CRESTOR) 10 MG tablet Take 10 mg by mouth daily.    Marland Kitchen SYNTHROID 125 MCG tablet Take 1 tablet by mouth daily.  5  . TRILIPIX 135 MG capsule Take 135 mg by mouth daily.     Marland Kitchen ZETIA 10 MG tablet      No current facility-administered medications for this visit.    PHYSICAL EXAMINATION: ECOG PERFORMANCE STATUS: 1 - Symptomatic but completely ambulatory  Filed Vitals:   05/16/15 1011  BP: 193/66  Pulse: 64  Temp: 97.5 F (36.4 C)  Resp: 18   Filed Weights   05/16/15 1011  Weight: 183 lb 8 oz (83.235 kg)    GENERAL:alert, no distress and comfortable SKIN: skin color, texture, turgor are normal, no rashes or significant lesions EYES: normal, Conjunctiva are pink and non-injected, sclera clear OROPHARYNX:no exudate, no erythema and lips, buccal mucosa, and tongue normal  NECK: supple, thyroid normal size, non-tender, without nodularity LYMPH:  no palpable lymphadenopathy in the cervical, axillary or inguinal LUNGS: clear to auscultation and percussion with normal breathing effort HEART: regular rate & rhythm and no murmurs and no lower extremity edema ABDOMEN:abdomen soft, non-tender and normal bowel sounds MUSCULOSKELETAL:no cyanosis of digits and no clubbing  NEURO: alert & oriented x 3 with fluent speech, no focal motor/sensory deficits EXTREMITIES: No lower extremity edema BREAST: No palpable masses or nodules in either right or left breasts. No palpable axillary supraclavicular or infraclavicular adenopathy no breast tenderness or nipple discharge. (exam performed in the presence of a chaperone)  LABORATORY DATA:  I have reviewed the data as listed   Chemistry      Component Value Date/Time   NA 139 05/16/2015 0955   NA 136 04/21/2011 1405   K 4.5 05/16/2015 0955   K 4.1 04/21/2011 1405    CL 100 04/22/2012 0902   CL 101 04/21/2011 1405   CO2 25 05/16/2015 0955   CO2 24 04/21/2011 1405   BUN 27.8* 05/16/2015 0955   BUN 20 04/21/2011 1405   CREATININE 1.4* 05/16/2015 0955   CREATININE 1.03 04/21/2011 1405      Component Value Date/Time   CALCIUM 9.3 05/16/2015 0955   CALCIUM 9.1 04/21/2011 1405   ALKPHOS 31* 05/16/2015 0955   ALKPHOS 34* 04/21/2011 1405   AST 31 05/16/2015 0955   AST 31 04/21/2011 1405   ALT 23 05/16/2015 0955   ALT 27 04/21/2011 1405   BILITOT 0.40 05/16/2015 0955   BILITOT 0.4 04/21/2011 1405       Lab Results  Component Value Date   WBC 4.8 05/16/2015   HGB 12.3 05/16/2015   HCT 36.9 05/16/2015   MCV 95.4 05/16/2015   PLT 181 05/16/2015   NEUTROABS 2.7 05/16/2015     ASSESSMENT & PLAN:  Breast cancer of upper-outer quadrant of left female breast [facility]) Left breast cancer diagnosed November 2000, two synchronous primaries both were ER/PR negative however one was HER-2 positive, 3 mm focus in  1 sentinel node, 13 lymph nodes negative status post lumpectomy followed by 4 cycles Adriamycin and Cytoxan followed by radiation, reductive mammoplasty right breast 03/21/2004  Breast cancer surveillance: 1. Mammograms 05/10/15: No abnormalities, density cat B 2. Breast exam done 05/16/2015 is normal  Patient had lost overall 130 pounds by dieting. Return to clinic in 1 year for follow-up and breast exams and surveillance at the survivorship clinic.   Orders Placed This Encounter  Procedures  . Amb Referral to Survivorship Long term    Referral Priority:  Routine    Referral Type:  Consultation    Number of Visits Requested:  1   The patient has a good understanding of the overall plan. she agrees with it. she will call with any problems that may develop before the next visit here.   Rulon Eisenmenger, MD 05/16/2015

## 2015-05-16 NOTE — Telephone Encounter (Signed)
appt made and avs printed °

## 2015-05-17 NOTE — Progress Notes (Signed)
Unable to get in to exam room prior to MD.  No assessment performed.  

## 2015-05-22 DIAGNOSIS — E118 Type 2 diabetes mellitus with unspecified complications: Secondary | ICD-10-CM | POA: Diagnosis not present

## 2015-05-22 DIAGNOSIS — I2510 Atherosclerotic heart disease of native coronary artery without angina pectoris: Secondary | ICD-10-CM | POA: Diagnosis not present

## 2015-07-11 DIAGNOSIS — I10 Essential (primary) hypertension: Secondary | ICD-10-CM | POA: Diagnosis not present

## 2015-07-11 DIAGNOSIS — E1165 Type 2 diabetes mellitus with hyperglycemia: Secondary | ICD-10-CM | POA: Diagnosis not present

## 2015-07-11 DIAGNOSIS — Z0000 Encounter for general adult medical examination without abnormal findings: Secondary | ICD-10-CM | POA: Diagnosis not present

## 2015-07-11 DIAGNOSIS — E118 Type 2 diabetes mellitus with unspecified complications: Secondary | ICD-10-CM | POA: Diagnosis not present

## 2015-07-19 DIAGNOSIS — E559 Vitamin D deficiency, unspecified: Secondary | ICD-10-CM | POA: Diagnosis not present

## 2015-07-19 DIAGNOSIS — E118 Type 2 diabetes mellitus with unspecified complications: Secondary | ICD-10-CM | POA: Diagnosis not present

## 2015-07-19 DIAGNOSIS — E789 Disorder of lipoprotein metabolism, unspecified: Secondary | ICD-10-CM | POA: Diagnosis not present

## 2015-07-19 DIAGNOSIS — I10 Essential (primary) hypertension: Secondary | ICD-10-CM | POA: Diagnosis not present

## 2015-08-07 DIAGNOSIS — E118 Type 2 diabetes mellitus with unspecified complications: Secondary | ICD-10-CM | POA: Diagnosis not present

## 2015-08-07 DIAGNOSIS — I10 Essential (primary) hypertension: Secondary | ICD-10-CM | POA: Diagnosis not present

## 2015-08-08 DIAGNOSIS — E113392 Type 2 diabetes mellitus with moderate nonproliferative diabetic retinopathy without macular edema, left eye: Secondary | ICD-10-CM | POA: Diagnosis not present

## 2015-08-08 DIAGNOSIS — E113491 Type 2 diabetes mellitus with severe nonproliferative diabetic retinopathy without macular edema, right eye: Secondary | ICD-10-CM | POA: Diagnosis not present

## 2015-08-08 DIAGNOSIS — H35361 Drusen (degenerative) of macula, right eye: Secondary | ICD-10-CM | POA: Diagnosis not present

## 2015-08-08 DIAGNOSIS — H353131 Nonexudative age-related macular degeneration, bilateral, early dry stage: Secondary | ICD-10-CM | POA: Diagnosis not present

## 2015-08-14 DIAGNOSIS — R05 Cough: Secondary | ICD-10-CM | POA: Diagnosis not present

## 2015-08-14 DIAGNOSIS — E118 Type 2 diabetes mellitus with unspecified complications: Secondary | ICD-10-CM | POA: Diagnosis not present

## 2015-08-14 DIAGNOSIS — I10 Essential (primary) hypertension: Secondary | ICD-10-CM | POA: Diagnosis not present

## 2015-10-10 DIAGNOSIS — N390 Urinary tract infection, site not specified: Secondary | ICD-10-CM | POA: Diagnosis not present

## 2015-10-10 DIAGNOSIS — Z0000 Encounter for general adult medical examination without abnormal findings: Secondary | ICD-10-CM | POA: Diagnosis not present

## 2015-10-10 DIAGNOSIS — I10 Essential (primary) hypertension: Secondary | ICD-10-CM | POA: Diagnosis not present

## 2015-10-10 DIAGNOSIS — E559 Vitamin D deficiency, unspecified: Secondary | ICD-10-CM | POA: Diagnosis not present

## 2015-10-10 DIAGNOSIS — E118 Type 2 diabetes mellitus with unspecified complications: Secondary | ICD-10-CM | POA: Diagnosis not present

## 2015-10-10 DIAGNOSIS — E039 Hypothyroidism, unspecified: Secondary | ICD-10-CM | POA: Diagnosis not present

## 2015-10-17 DIAGNOSIS — E789 Disorder of lipoprotein metabolism, unspecified: Secondary | ICD-10-CM | POA: Diagnosis not present

## 2015-10-17 DIAGNOSIS — I10 Essential (primary) hypertension: Secondary | ICD-10-CM | POA: Diagnosis not present

## 2015-10-17 DIAGNOSIS — E118 Type 2 diabetes mellitus with unspecified complications: Secondary | ICD-10-CM | POA: Diagnosis not present

## 2015-10-17 DIAGNOSIS — E032 Hypothyroidism due to medicaments and other exogenous substances: Secondary | ICD-10-CM | POA: Diagnosis not present

## 2015-11-19 DIAGNOSIS — K5900 Constipation, unspecified: Secondary | ICD-10-CM | POA: Diagnosis not present

## 2015-11-19 DIAGNOSIS — Z91010 Allergy to peanuts: Secondary | ICD-10-CM | POA: Diagnosis not present

## 2015-11-19 DIAGNOSIS — Z853 Personal history of malignant neoplasm of breast: Secondary | ICD-10-CM | POA: Diagnosis not present

## 2015-11-19 DIAGNOSIS — R944 Abnormal results of kidney function studies: Secondary | ICD-10-CM | POA: Diagnosis not present

## 2015-11-19 DIAGNOSIS — R7989 Other specified abnormal findings of blood chemistry: Secondary | ICD-10-CM | POA: Diagnosis not present

## 2015-11-19 DIAGNOSIS — K6289 Other specified diseases of anus and rectum: Secondary | ICD-10-CM | POA: Diagnosis not present

## 2015-11-19 DIAGNOSIS — I10 Essential (primary) hypertension: Secondary | ICD-10-CM | POA: Diagnosis not present

## 2015-11-19 DIAGNOSIS — Z881 Allergy status to other antibiotic agents status: Secondary | ICD-10-CM | POA: Diagnosis not present

## 2015-11-19 DIAGNOSIS — E785 Hyperlipidemia, unspecified: Secondary | ICD-10-CM | POA: Diagnosis not present

## 2015-11-19 DIAGNOSIS — E1165 Type 2 diabetes mellitus with hyperglycemia: Secondary | ICD-10-CM | POA: Diagnosis not present

## 2015-11-19 DIAGNOSIS — Z79899 Other long term (current) drug therapy: Secondary | ICD-10-CM | POA: Diagnosis not present

## 2015-11-19 DIAGNOSIS — Z7984 Long term (current) use of oral hypoglycemic drugs: Secondary | ICD-10-CM | POA: Diagnosis not present

## 2015-11-19 DIAGNOSIS — E079 Disorder of thyroid, unspecified: Secondary | ICD-10-CM | POA: Diagnosis not present

## 2015-11-19 DIAGNOSIS — Z885 Allergy status to narcotic agent status: Secondary | ICD-10-CM | POA: Diagnosis not present

## 2015-11-19 DIAGNOSIS — R935 Abnormal findings on diagnostic imaging of other abdominal regions, including retroperitoneum: Secondary | ICD-10-CM | POA: Diagnosis not present

## 2015-11-20 DIAGNOSIS — E039 Hypothyroidism, unspecified: Secondary | ICD-10-CM | POA: Diagnosis not present

## 2015-11-20 DIAGNOSIS — N390 Urinary tract infection, site not specified: Secondary | ICD-10-CM | POA: Diagnosis not present

## 2015-11-20 DIAGNOSIS — K5900 Constipation, unspecified: Secondary | ICD-10-CM | POA: Diagnosis not present

## 2015-11-20 DIAGNOSIS — E118 Type 2 diabetes mellitus with unspecified complications: Secondary | ICD-10-CM | POA: Diagnosis not present

## 2015-11-27 DIAGNOSIS — N390 Urinary tract infection, site not specified: Secondary | ICD-10-CM | POA: Diagnosis not present

## 2015-11-27 DIAGNOSIS — R918 Other nonspecific abnormal finding of lung field: Secondary | ICD-10-CM | POA: Diagnosis not present

## 2015-11-27 DIAGNOSIS — E118 Type 2 diabetes mellitus with unspecified complications: Secondary | ICD-10-CM | POA: Diagnosis not present

## 2015-11-27 DIAGNOSIS — E032 Hypothyroidism due to medicaments and other exogenous substances: Secondary | ICD-10-CM | POA: Diagnosis not present

## 2015-11-27 DIAGNOSIS — Z09 Encounter for follow-up examination after completed treatment for conditions other than malignant neoplasm: Secondary | ICD-10-CM | POA: Diagnosis not present

## 2015-12-13 ENCOUNTER — Encounter (HOSPITAL_COMMUNITY): Payer: Self-pay

## 2015-12-13 ENCOUNTER — Emergency Department (HOSPITAL_COMMUNITY): Payer: Medicare Other

## 2015-12-13 ENCOUNTER — Emergency Department (HOSPITAL_COMMUNITY)
Admission: EM | Admit: 2015-12-13 | Discharge: 2015-12-13 | Disposition: A | Discharge status: Home / Self Care | Payer: Medicare Other | Attending: Emergency Medicine | Admitting: Emergency Medicine

## 2015-12-13 DIAGNOSIS — K6289 Other specified diseases of anus and rectum: Secondary | ICD-10-CM

## 2015-12-13 DIAGNOSIS — E039 Hypothyroidism, unspecified: Secondary | ICD-10-CM | POA: Diagnosis not present

## 2015-12-13 DIAGNOSIS — I10 Essential (primary) hypertension: Secondary | ICD-10-CM | POA: Insufficient documentation

## 2015-12-13 DIAGNOSIS — Z853 Personal history of malignant neoplasm of breast: Secondary | ICD-10-CM | POA: Insufficient documentation

## 2015-12-13 DIAGNOSIS — K5900 Constipation, unspecified: Secondary | ICD-10-CM | POA: Diagnosis not present

## 2015-12-13 DIAGNOSIS — Z79899 Other long term (current) drug therapy: Secondary | ICD-10-CM | POA: Insufficient documentation

## 2015-12-13 DIAGNOSIS — R109 Unspecified abdominal pain: Secondary | ICD-10-CM | POA: Diagnosis present

## 2015-12-13 DIAGNOSIS — Z7982 Long term (current) use of aspirin: Secondary | ICD-10-CM | POA: Diagnosis not present

## 2015-12-13 DIAGNOSIS — I2510 Atherosclerotic heart disease of native coronary artery without angina pectoris: Secondary | ICD-10-CM | POA: Diagnosis not present

## 2015-12-13 DIAGNOSIS — E119 Type 2 diabetes mellitus without complications: Secondary | ICD-10-CM | POA: Diagnosis not present

## 2015-12-13 LAB — CBC WITH DIFFERENTIAL/PLATELET
Basophils Absolute: 0 10*3/uL (ref 0.0–0.1)
Basophils Relative: 0 %
Eosinophils Absolute: 0 10*3/uL (ref 0.0–0.7)
Eosinophils Relative: 1 %
HCT: 40 % (ref 36.0–46.0)
Hemoglobin: 13.5 g/dL (ref 12.0–15.0)
Lymphocytes Relative: 30 %
Lymphs Abs: 1.6 10*3/uL (ref 0.7–4.0)
MCH: 31.5 pg (ref 26.0–34.0)
MCHC: 33.8 g/dL (ref 30.0–36.0)
MCV: 93.5 fL (ref 78.0–100.0)
Monocytes Absolute: 0.3 10*3/uL (ref 0.1–1.0)
Monocytes Relative: 6 %
Neutro Abs: 3.3 10*3/uL (ref 1.7–7.7)
Neutrophils Relative %: 63 %
Platelets: 206 10*3/uL (ref 150–400)
RBC: 4.28 MIL/uL (ref 3.87–5.11)
RDW: 13.2 % (ref 11.5–15.5)
WBC: 5.2 10*3/uL (ref 4.0–10.5)

## 2015-12-13 LAB — URINALYSIS, ROUTINE W REFLEX MICROSCOPIC
Bilirubin Urine: NEGATIVE
Glucose, UA: NEGATIVE mg/dL
Ketones, ur: NEGATIVE mg/dL
Leukocytes, UA: NEGATIVE
Nitrite: NEGATIVE
Protein, ur: 30 mg/dL — AB
Specific Gravity, Urine: 1.012 (ref 1.005–1.030)
pH: 7 (ref 5.0–8.0)

## 2015-12-13 LAB — URINE MICROSCOPIC-ADD ON
Bacteria, UA: NONE SEEN
WBC, UA: NONE SEEN WBC/hpf (ref 0–5)

## 2015-12-13 LAB — COMPREHENSIVE METABOLIC PANEL
ALT: 34 U/L (ref 14–54)
AST: 43 U/L — ABNORMAL HIGH (ref 15–41)
Albumin: 3.6 g/dL (ref 3.5–5.0)
Alkaline Phosphatase: 42 U/L (ref 38–126)
Anion gap: 9 (ref 5–15)
BUN: 21 mg/dL — ABNORMAL HIGH (ref 6–20)
CO2: 28 mmol/L (ref 22–32)
Calcium: 9.2 mg/dL (ref 8.9–10.3)
Chloride: 102 mmol/L (ref 101–111)
Creatinine, Ser: 1.26 mg/dL — ABNORMAL HIGH (ref 0.44–1.00)
GFR calc Af Amer: 48 mL/min — ABNORMAL LOW (ref >60)
GFR calc non Af Amer: 41 mL/min — ABNORMAL LOW (ref >60)
Glucose, Bld: 141 mg/dL — ABNORMAL HIGH (ref 65–99)
Potassium: 3.4 mmol/L — ABNORMAL LOW (ref 3.5–5.1)
Sodium: 139 mmol/L (ref 135–145)
Total Bilirubin: 0.8 mg/dL (ref 0.3–1.2)
Total Protein: 7.6 g/dL (ref 6.5–8.1)

## 2015-12-13 MED ORDER — IOPAMIDOL (ISOVUE-300) INJECTION 61%
100.0000 mL | Freq: Once | INTRAVENOUS | Status: AC | PRN
  Administered 2015-12-13: 80 mL via INTRAVENOUS

## 2015-12-13 MED ORDER — LACTULOSE 10 GM/15ML PO SOLN: 10.0000 g | Freq: Two times a day (BID) | ORAL | 0 refills | Status: AC

## 2015-12-13 MED ORDER — GLYCERIN (LAXATIVE) 2.1 G RE SUPP
1.0000 | Freq: Once | RECTAL | Status: AC
  Administered 2015-12-13: 1 via RECTAL
  Filled 2015-12-13: qty 1

## 2015-12-13 MED ORDER — IOPAMIDOL (ISOVUE-300) INJECTION 61%: 30.0000 mL | Freq: Once | INTRAVENOUS | Status: DC | PRN

## 2015-12-13 MED ORDER — FENTANYL CITRATE (PF) 100 MCG/2ML IJ SOLN
50.0000 ug | Freq: Once | INTRAMUSCULAR | Status: AC
  Administered 2015-12-13: 50 ug via INTRAVENOUS
  Filled 2015-12-13: qty 2

## 2015-12-13 MED ORDER — DOCUSATE SODIUM 100 MG PO CAPS: 100.0000 mg | ORAL_CAPSULE | Freq: Two times a day (BID) | ORAL | 0 refills | Status: AC

## 2015-12-13 MED ORDER — SODIUM CHLORIDE 0.9 % IV BOLUS (SEPSIS)
1000.0000 mL | Freq: Once | INTRAVENOUS | Status: AC
  Administered 2015-12-13: 1000 mL via INTRAVENOUS

## 2015-12-13 MED ORDER — BISACODYL 10 MG RE SUPP: 10.0000 mg | RECTAL | 0 refills | Status: AC | PRN

## 2015-12-13 NOTE — ED Triage Notes (Signed)
Pt c/o intermittent constipation and rectal pain/pressure x 1 month increasing last night.  Pain score 8/10.  Pt reports that she had to be disimpacted earlier this month and was started on Linzess.  Sts rectal pressure became severe last night.  Sts "very small bowel movements."

## 2015-12-13 NOTE — ED Notes (Signed)
Signature pad not working. 

## 2015-12-13 NOTE — Discharge Instructions (Signed)
Return here as needed.  Increase your fluid intake.  Follow-up with your primary care doctor.

## 2015-12-14 NOTE — ED Provider Notes (Signed)
[facility] DEPT Provider Note   CSN: [id number] Arrival date & time: 12/13/15  0840     History   Chief Complaint Chief Complaint  Patient presents with  . Constipation  . Rectal Pain    HPI Jillian Willis is a 75 y.o. female.  HPI Patient presents to the emergency department with rectal pain and constipation.  Patient states that she has had constipation that has been occurring over several months, but got worse over the last week.  The patient, states she has had increasing pain in her rectum over the last 2 days.  Patient states nothing seems make her condition better or worse.  Patient states that she did not take any medications prior to arrival other than a constipation medication that her doctor prescribed.The patient denies chest pain, shortness of breath, headache,blurred vision, neck pain, fever, cough, weakness, numbness, dizziness, anorexia, edema, nausea, vomiting, diarrhea, rash, back pain, dysuria, hematemesis, bloody stool, near syncope, or syncope. Past Medical History:  Diagnosis Date  . Cancer ([place])    L breast CA  . Depression   . Diabetes mellitus   . GERD (gastroesophageal reflux disease)   . Hyperlipidemia   . Hypertension   . Hypothyroidism   . IHD (ischemic heart disease)    s/p cath in 2009 showing heavily calcified left main with mild to moderate diffuse CAD. Remote angioplasty of the mid LAD in 2004.  Last myoview in 2012 felt to be low risk.   . Obesity     Patient Active Problem List   Diagnosis Date Noted  . Hyperlipidemia   . Hypertension   . Obesity   . IHD (ischemic heart disease)   . Depression   . GERD (gastroesophageal reflux disease)   . Hypothyroidism   . CAD (coronary artery disease) 05/16/2011  . Breast cancer of upper-outer quadrant of left female breast ([place]) 04/21/2011    Past Surgical History:  Procedure Laterality Date  . BREAST SURGERY  2000   lympectomy on Left Br  . BREAST SURGERY  2005   Right Br Reduction    . CARDIAC CATHETERIZATION  06/29/2007   EF 50-55%  . CARDIAC CATHETERIZATION  11/14/2002   EF 55-60%  . CARDIAC CATHETERIZATION  05/09/2002   EF 45%  . CARDIOVASCULAR STRESS TEST  05/23/10   EF 68%, NO ISCHEMIA    OB History    No data available       Home Medications    Prior to Admission medications   Medication Sig Start Date End Date Taking? Authorizing Provider  ACCU-CHEK SMARTVIEW test strip  06/11/11  Yes Historical Provider, MD  aspirin 81 MG tablet Take 81 mg by mouth daily.    Yes Historical Provider, MD  Chlorphen-Pyril-Phenyleph (TRIPLEX AD PO) Take 1 tablet by mouth daily.   Yes Historical Provider, MD  diclofenac sodium (VOLTAREN) 1 % GEL Apply 2 g topically 4 (four) times daily as needed. Apply 2-4 grams to affected area 4 times a day  01/23/15  Yes Historical Provider, MD  escitalopram (LEXAPRO) 10 MG tablet Take 10 mg by mouth as needed. Depression/anxiety.   Yes Historical Provider, MD  glipiZIDE (GLUCOTROL XL) 2.5 MG 24 hr tablet Take 1 tablet by mouth 2 (two) times daily. 12/20/13  Yes Historical Provider, MD  LINZESS 145 MCG CAPS capsule Take 145 mcg by mouth every morning. 12/11/15  Yes Historical Provider, MD  liothyronine (CYTOMEL) 25 MCG tablet Take 25 mcg by mouth every morning.  07/08/13  Yes Historical  Provider, MD  losartan (COZAAR) 50 MG tablet Take 50 mg by mouth daily.  04/20/13  Yes Historical Provider, MD  metoprolol (LOPRESSOR) 50 MG tablet Take 50 mg by mouth 2 (two) times daily. 11/19/15  Yes Historical Provider, MD  nitrofurantoin (MACRODANTIN) 50 MG capsule Take 1 capsule by mouth as needed. Take 1 capsule, after 5 minutes if no relief take a second capsule and head for the hospital 01/25/14  Yes Historical Provider, MD  nitroGLYCERIN (NITROSTAT) 0.4 MG SL tablet Place 1 tablet (0.4 mg total) under the tongue every 5 (five) minutes as needed. 05/20/12  Yes Lelon Perla, MD  rosuvastatin (CRESTOR) 10 MG tablet Take 10 mg by mouth daily.   Yes Historical  Provider, MD  SYNTHROID 125 MCG tablet Take 1 tablet by mouth daily. 12/20/13  Yes Historical Provider, MD  VICTOZA 18 MG/3ML SOPN Inject 1.8 mg into the skin at bedtime. 11/19/15  Yes Historical Provider, MD  ZETIA 10 MG tablet  07/06/13  Yes Historical Provider, MD  bisacodyl (BISACODYL LAXATIVE) 10 MG suppository Place 1 suppository (10 mg total) rectally as needed for moderate constipation. 12/13/15   Dalia Heading, PA-C  Choline Fenofibrate (FENOFIBRIC ACID) 135 MG CPDR Take 135 mcg by mouth daily. 11/19/15   Historical Provider, MD  docusate sodium (COLACE) 100 MG capsule Take 1 capsule (100 mg total) by mouth every 12 (twelve) hours. 12/13/15   Dalia Heading, PA-C  lactulose (CHRONULAC) 10 GM/15ML solution Take 15 mLs (10 g total) by mouth 2 (two) times daily. 12/13/15   Dalia Heading, PA-C    Family History Family History  Problem Relation Age of Onset  . Heart attack Mother   . Cancer Mother     Breast  . Heart disease Father   . Stroke Father     Social History Social History  Substance Use Topics  . Smoking status: Never Smoker  . Smokeless tobacco: Never Used  . Alcohol use No     Allergies   Peanut oil; Ciprofloxacin; and Codeine   Review of Systems Review of Systems  All other systems negative except as documented in the HPI. All pertinent positives and negatives as reviewed in the HPI. Physical Exam Updated Vital Signs BP 190/83 (BP Location: Right Arm)   Pulse 66   Temp 98 F (36.7 C) (Oral)   Resp 18   SpO2 96%   Physical Exam  Constitutional: She is oriented to person, place, and time. She appears well-developed and well-nourished. No distress.  HENT:  Head: Normocephalic and atraumatic.  Mouth/Throat: Oropharynx is clear and moist.  Eyes: Pupils are equal, round, and reactive to light.  Neck: Normal range of motion. Neck supple.  Cardiovascular: Normal rate, regular rhythm and normal heart sounds.  Exam reveals no gallop and no friction  rub.   No murmur heard. Pulmonary/Chest: Effort normal and breath sounds normal. No respiratory distress. She has no wheezes.  Abdominal: Soft. Bowel sounds are normal. She exhibits no distension. There is tenderness. There is no guarding.    Neurological: She is alert and oriented to person, place, and time. She exhibits normal muscle tone. Coordination normal.  Skin: Skin is warm and dry. No rash noted. No erythema.  Psychiatric: She has a normal mood and affect. Her behavior is normal.  Nursing note and vitals reviewed.    ED Treatments / Results  Labs (all labs ordered are listed, but only abnormal results are displayed) Labs Reviewed  COMPREHENSIVE METABOLIC PANEL - Abnormal; Notable for  the following:       Result Value   Potassium 3.4 (*)    Glucose, Bld 141 (*)    BUN 21 (*)    Creatinine, Ser 1.26 (*)    AST 43 (*)    GFR calc non Af Amer 41 (*)    GFR calc Af Amer 48 (*)    All other components within normal limits  URINALYSIS, ROUTINE W REFLEX MICROSCOPIC (NOT AT [facility]) - Abnormal; Notable for the following:    Hgb urine dipstick TRACE (*)    Protein, ur 30 (*)    All other components within normal limits  URINE MICROSCOPIC-ADD ON - Abnormal; Notable for the following:    Squamous Epithelial / LPF 0-5 (*)    All other components within normal limits  CBC WITH DIFFERENTIAL/PLATELET    EKG  EKG Interpretation None       Radiology Ct Abdomen Pelvis W Contrast  Result Date: 12/13/2015 CLINICAL DATA:  Intermittent constipation, rectal pain with progression last night EXAM: CT ABDOMEN AND PELVIS WITH CONTRAST TECHNIQUE: Multidetector CT imaging of the abdomen and pelvis was performed using the standard protocol following bolus administration of intravenous contrast. CONTRAST:  59mL ISOVUE-300 IOPAMIDOL (ISOVUE-300) INJECTION 61% COMPARISON:  06/15/2013 FINDINGS: Lower chest: The lung bases are unremarkable. Hepatobiliary: Mild fatty infiltration of the liver. No  focal hepatic mass. Again noted status post cholecystectomy. Pancreas: Enhanced pancreas is unremarkable. Spleen: Stable chronic subcapsular small collection. No perisplenic fluid. No focal splenic mass. Adrenals/Urinary Tract: No adrenal gland mass. Bilateral lobulated renal contour. No hydronephrosis or hydroureter. Delayed renal images shows bilateral renal symmetrical excretion. Bilateral visualized proximal ureter is unremarkable. There is a left renal cyst measures 1.2 cm. Stomach/Bowel: No gastric outlet obstruction. No small bowel obstruction. Terminal ileum is unremarkable. No pericecal inflammation. Normal appendix is partially visualized. Moderate stool noted within transverse colon and left colon. Moderate stool noted within rectum. The rectum measures 4.8 cm in diameter. No distal colonic obstruction. Vascular/Lymphatic: Atherosclerotic calcifications of abdominal aorta and iliac arteries. Atherosclerotic calcification bilateral renal artery origin. No aortic aneurysm. No retroperitoneal or mesenteric adenopathy. Reproductive: The uterus is atrophic.  No adnexal mass. Other: Nonspecific minimal thickening of urinary bladder wall. Mild cystitis cannot be excluded. No ascites or free air. No inguinal adenopathy. Musculoskeletal: No destructive bony lesions are noted. Sagittal images of the spine shows osteopenia and mild degenerative changes lumbar spine. Degenerative changes bilateral SI joints. Degenerative changes pubic symphysis. IMPRESSION: 1. No acute inflammatory process within abdomen. 2. Stable tiny subcapsular splenic collection. 3. Moderate stool noted within right colon. Moderate stool noted within rectum. No colitis or diverticulitis. No distal colonic obstruction. 4. Normal appendix.  No pericecal inflammation. 5. No hydronephrosis or hydroureter. 6. Mild degenerative changes lumbar spine. Electronically Signed   By: Lahoma Crocker M.D.   On: 12/13/2015 17:01    Procedures Procedures  (including critical care time)  Medications Ordered in ED Medications  iopamidol (ISOVUE-300) 61 % injection 100 mL (80 mLs Intravenous Contrast Given 12/13/15 1646)  sodium chloride 0.9 % bolus 1,000 mL (0 mLs Intravenous Stopped 12/13/15 1830)  fentaNYL (SUBLIMAZE) injection 50 mcg (50 mcg Intravenous Given 12/13/15 1726)  Glycerin (Adult) 2.1 g suppository 1 suppository (1 suppository Rectal Given 12/13/15 1727)     Initial Impression / Assessment and Plan / ED Course  I have reviewed the triage vital signs and the nursing notes.  Pertinent labs & imaging results that were available during my care of the patient were  reviewed by me and considered in my medical decision making (see chart for details).  Clinical Course    I advised the patient of the results and all questions were answered.  She is feeling better at this time.  I will have her follow-up with her primary care Dr. told to return here as needed.  The patient mainly seems to be dealing with constipation  Final Clinical Impressions(s) / ED Diagnoses   Final diagnoses:  Rectal pain  Constipation, unspecified constipation type    New Prescriptions Discharge Medication List as of 12/13/2015  5:50 PM    START taking these medications   Details  bisacodyl (BISACODYL LAXATIVE) 10 MG suppository Place 1 suppository (10 mg total) rectally as needed for moderate constipation., Starting Thu 12/13/2015, Print    docusate sodium (COLACE) 100 MG capsule Take 1 capsule (100 mg total) by mouth every 12 (twelve) hours., Starting Thu 12/13/2015, Print    lactulose (CHRONULAC) 10 GM/15ML solution Take 15 mLs (10 g total) by mouth 2 (two) times daily., Starting Thu 12/13/2015, Print         Dalia Heading, PA-C 12/14/15 [place], MD 12/15/15 [phone]

## 2015-12-19 DIAGNOSIS — E118 Type 2 diabetes mellitus with unspecified complications: Secondary | ICD-10-CM | POA: Diagnosis not present

## 2015-12-26 DIAGNOSIS — K5900 Constipation, unspecified: Secondary | ICD-10-CM | POA: Diagnosis not present

## 2015-12-26 DIAGNOSIS — E118 Type 2 diabetes mellitus with unspecified complications: Secondary | ICD-10-CM | POA: Diagnosis not present

## 2016-01-02 DIAGNOSIS — K5900 Constipation, unspecified: Secondary | ICD-10-CM | POA: Diagnosis not present

## 2016-01-22 DIAGNOSIS — E7800 Pure hypercholesterolemia, unspecified: Secondary | ICD-10-CM | POA: Diagnosis not present

## 2016-01-22 DIAGNOSIS — I10 Essential (primary) hypertension: Secondary | ICD-10-CM | POA: Diagnosis not present

## 2016-01-22 DIAGNOSIS — E039 Hypothyroidism, unspecified: Secondary | ICD-10-CM | POA: Diagnosis not present

## 2016-01-22 DIAGNOSIS — E118 Type 2 diabetes mellitus with unspecified complications: Secondary | ICD-10-CM | POA: Diagnosis not present

## 2016-01-23 DIAGNOSIS — T887XXA Unspecified adverse effect of drug or medicament, initial encounter: Secondary | ICD-10-CM | POA: Diagnosis not present

## 2016-01-23 DIAGNOSIS — K5900 Constipation, unspecified: Secondary | ICD-10-CM | POA: Diagnosis not present

## 2016-01-23 DIAGNOSIS — Z23 Encounter for immunization: Secondary | ICD-10-CM | POA: Diagnosis not present

## 2016-01-23 DIAGNOSIS — E118 Type 2 diabetes mellitus with unspecified complications: Secondary | ICD-10-CM | POA: Diagnosis not present

## 2016-01-30 DIAGNOSIS — H35361 Drusen (degenerative) of macula, right eye: Secondary | ICD-10-CM | POA: Diagnosis not present

## 2016-01-30 DIAGNOSIS — E113491 Type 2 diabetes mellitus with severe nonproliferative diabetic retinopathy without macular edema, right eye: Secondary | ICD-10-CM | POA: Diagnosis not present

## 2016-01-30 DIAGNOSIS — E113392 Type 2 diabetes mellitus with moderate nonproliferative diabetic retinopathy without macular edema, left eye: Secondary | ICD-10-CM | POA: Diagnosis not present

## 2016-01-30 DIAGNOSIS — H353131 Nonexudative age-related macular degeneration, bilateral, early dry stage: Secondary | ICD-10-CM | POA: Diagnosis not present

## 2016-04-02 ENCOUNTER — Other Ambulatory Visit: Payer: Self-pay | Admitting: Endocrinology

## 2016-04-02 DIAGNOSIS — Z1231 Encounter for screening mammogram for malignant neoplasm of breast: Secondary | ICD-10-CM

## 2016-04-17 DIAGNOSIS — E039 Hypothyroidism, unspecified: Secondary | ICD-10-CM | POA: Diagnosis not present

## 2016-04-17 DIAGNOSIS — E118 Type 2 diabetes mellitus with unspecified complications: Secondary | ICD-10-CM | POA: Diagnosis not present

## 2016-04-17 DIAGNOSIS — I10 Essential (primary) hypertension: Secondary | ICD-10-CM | POA: Diagnosis not present

## 2016-04-24 DIAGNOSIS — E118 Type 2 diabetes mellitus with unspecified complications: Secondary | ICD-10-CM | POA: Diagnosis not present

## 2016-04-24 DIAGNOSIS — R55 Syncope and collapse: Secondary | ICD-10-CM | POA: Diagnosis not present

## 2016-04-24 DIAGNOSIS — E789 Disorder of lipoprotein metabolism, unspecified: Secondary | ICD-10-CM | POA: Diagnosis not present

## 2016-04-25 ENCOUNTER — Ambulatory Visit (INDEPENDENT_AMBULATORY_CARE_PROVIDER_SITE_OTHER): Payer: Medicare Other | Admitting: Cardiology

## 2016-04-25 ENCOUNTER — Encounter: Payer: Self-pay | Admitting: Cardiology

## 2016-04-25 VITALS — BP 128/80 | HR 50 | Ht 62.0 in | Wt 191.2 lb

## 2016-04-25 DIAGNOSIS — I10 Essential (primary) hypertension: Secondary | ICD-10-CM

## 2016-04-25 DIAGNOSIS — I2510 Atherosclerotic heart disease of native coronary artery without angina pectoris: Secondary | ICD-10-CM

## 2016-04-25 DIAGNOSIS — E7800 Pure hypercholesterolemia, unspecified: Secondary | ICD-10-CM

## 2016-04-25 MED ORDER — METOPROLOL TARTRATE 25 MG PO TABS: 25.0000 mg | ORAL_TABLET | Freq: Two times a day (BID) | ORAL | 3 refills | Status: DC

## 2016-04-25 NOTE — Progress Notes (Signed)
HPI: FU CAD. She has had remote angioplasty of the mid LAD back in 2004. Last cath in 2009 showed a 30-40% left main, 60-70% ostial circumflex which was relatively small, 30-40% proximal LAD. Ejection fraction was 50-55% with mild anterior hypokinesis. Nuclear study June 2015 showed a small anteroapical scar with minimal reversibility. Ejection fraction 61%. Treated medically. Since last seen, patient denies dyspnea, chest pain, palpitations or syncope. She does have some fatigue. She was seen at primary care's office yesterday and described an episode recently where she had dizziness for 5 minutes. No associated chest pain, palpitations, nausea or syncope. An electrocardiogram was obtained and she was noted to be bradycardic. We were asked to evaluate.  Current Outpatient Prescriptions  Medication Sig Dispense Refill  . ACCU-CHEK SMARTVIEW test strip     . aspirin 81 MG tablet Take 81 mg by mouth daily.     . bisacodyl (BISACODYL LAXATIVE) 10 MG suppository Place 1 suppository (10 mg total) rectally as needed for moderate constipation. 12 suppository 0  . Chlorphen-Pyril-Phenyleph (TRIPLEX AD PO) Take 1 tablet by mouth daily.    . Choline Fenofibrate (FENOFIBRIC ACID) 135 MG CPDR Take 135 mcg by mouth daily.    . diclofenac sodium (VOLTAREN) 1 % GEL Apply 2 g topically 4 (four) times daily as needed. Apply 2-4 grams to affected area 4 times a day   1  . docusate sodium (COLACE) 100 MG capsule Take 1 capsule (100 mg total) by mouth every 12 (twelve) hours. 60 capsule 0  . escitalopram (LEXAPRO) 10 MG tablet Take 10 mg by mouth as needed. Depression/anxiety.    Marland Kitchen glipiZIDE (GLUCOTROL XL) 2.5 MG 24 hr tablet Take 1 tablet by mouth 2 (two) times daily.  0  . lactulose (CHRONULAC) 10 GM/15ML solution Take 15 mLs (10 g total) by mouth 2 (two) times daily. 240 mL 0  . LEVEMIR FLEXTOUCH 100 UNIT/ML Pen 10 units in the morning and 20 units at night    . LINZESS 145 MCG CAPS capsule Take 145 mcg by  mouth every morning.    Marland Kitchen liothyronine (CYTOMEL) 25 MCG tablet Take 25 mcg by mouth every morning.     Marland Kitchen losartan (COZAAR) 50 MG tablet Take 50 mg by mouth daily.     . metoprolol (LOPRESSOR) 50 MG tablet Take 50 mg by mouth 2 (two) times daily.    . nitrofurantoin (MACRODANTIN) 50 MG capsule Take 1 capsule by mouth as needed. Take 1 capsule, after 5 minutes if no relief take a second capsule and head for the hospital  3  . nitroGLYCERIN (NITROSTAT) 0.4 MG SL tablet Place 1 tablet (0.4 mg total) under the tongue every 5 (five) minutes as needed. 25 tablet 6  . rosuvastatin (CRESTOR) 10 MG tablet Take 10 mg by mouth daily.    Marland Kitchen SYNTHROID 125 MCG tablet Take 1 tablet by mouth daily.  5  . ZETIA 10 MG tablet      No current facility-administered medications for this visit.      Past Medical History:  Diagnosis Date  . Cancer ([place])    L breast CA  . Depression   . Diabetes mellitus   . GERD (gastroesophageal reflux disease)   . Hyperlipidemia   . Hypertension   . Hypothyroidism   . IHD (ischemic heart disease)    s/p cath in 2009 showing heavily calcified left main with mild to moderate diffuse CAD. Remote angioplasty of the mid LAD in 2004.  Last myoview in 2012 felt to be low risk.   . Obesity     Past Surgical History:  Procedure Laterality Date  . BREAST SURGERY  2000   lympectomy on Left Br  . BREAST SURGERY  2005   Right Br Reduction  . CARDIAC CATHETERIZATION  06/29/2007   EF 50-55%  . CARDIAC CATHETERIZATION  11/14/2002   EF 55-60%  . CARDIAC CATHETERIZATION  05/09/2002   EF 45%  . CARDIOVASCULAR STRESS TEST  05/23/10   EF 68%, NO ISCHEMIA    Social History   Social History  . Marital status: Married    Spouse name: N/A  . Number of children: N/A  . Years of education: N/A   Occupational History  . Not on file.   Social History Main Topics  . Smoking status: Never Smoker  . Smokeless tobacco: Never Used  . Alcohol use No  . Drug use: No  . Sexual activity:  Not on file   Other Topics Concern  . Not on file   Social History Narrative  . No narrative on file    Family History  Problem Relation Age of Onset  . Heart attack Mother   . Cancer Mother     Breast  . Heart disease Father   . Stroke Father     ROS: fatigue but no fevers or chills, productive cough, hemoptysis, dysphasia, odynophagia, melena, hematochezia, dysuria, hematuria, rash, seizure activity, orthopnea, PND, pedal edema, claudication. Remaining systems are negative.  Physical Exam: Well-developed obese in no acute distress.  Skin is warm and dry.  HEENT is normal.  Neck is supple. No bruits Chest is clear to auscultation with normal expansion.  Cardiovascular exam is regular rate and rhythm.  Abdominal exam nontender or distended. No masses palpated. Extremities show no edema. neuro grossly intact  ECG-sinus bradycardia at a rate of 50. Cannot rule out prior septal infarct. Lateral T-wave inversion. Unchanged compared to 02/21/2015.  A/P  1 coronary artery disease-continue aspirin and statin.  2 hypertension-blood pressure controlled. Continue present medications.  3 hyperlipidemia-continue statin. Lipids and liver monitored by primary care.  4 Dizziness/fatigue-patient had an episode of dizziness for approximately 5 minutes that was not clearly cardiac. However she is also having fatigue and her heart rate is 50. I will decrease her metoprolol to 25 mg twice a day to see if this improves her fatigue. She will follow her blood pressure at home and we will adjust regimen as needed.  Kirk Ruths, MD

## 2016-04-25 NOTE — Patient Instructions (Signed)
Medication Instructions:   DECREASE METOPROLOL TO 25 MG TWICE DAILY= 1/2 OF THE 50 MG TABLET TWICE DAILY  Follow-Up:  Your physician wants you to follow-up in: [place] will receive a reminder letter in the mail two months in advance. If you don't receive a letter, please call our office to schedule the follow-up appointment.   If you need a refill on your cardiac medications before your next appointment, please call your pharmacy.

## 2016-05-09 ENCOUNTER — Ambulatory Visit: Payer: Medicare Other

## 2016-05-13 NOTE — Progress Notes (Deleted)
CLINIC:  Survivorship   REASON FOR VISIT:  Routine follow-up for history of breast cancer.   BRIEF ONCOLOGIC HISTORY:  1. November 2000:  Cancer of the left breast  with 2 synchronous small undifferentiated cancers located in the upper inner quadrant of the left breast. Both tumors were ER/PR negative with high proliferative fractions. One of the tumors was HER-2 three plus positive. 1 of the 14 sentinel lymph nodes removed contained a small 3 mm focus of metastases. the additional 13 lymph nodes were negative.   2.  Following excision Mrs. Coach completed 4 cycles of adjuvant chemotherapy with Cytoxan and Adriamycin.   3.  Adjuvant radiation treatments to the left breast, which were completed in June of 2001.    INTERVAL HISTORY:  Ms. Chai presents to the Survivorship Clinic today for routine follow-up for her history of breast cancer.  Overall, she reports feeling quite well. ***    REVIEW OF SYSTEMS:  ***     Breast: Denies any new nodularity, masses, tenderness, nipple changes, or nipple discharge.    A 14-point review of systems was completed and was negative, except as noted above.    PAST MEDICAL/SURGICAL HISTORY:  Past Medical History:  Diagnosis Date  . Cancer ([place])    L breast CA  . Depression   . Diabetes mellitus   . GERD (gastroesophageal reflux disease)   . Hyperlipidemia   . Hypertension   . Hypothyroidism   . IHD (ischemic heart disease)    s/p cath in 2009 showing heavily calcified left main with mild to moderate diffuse CAD. Remote angioplasty of the mid LAD in 2004.  Last myoview in 2012 felt to be low risk.   . Obesity    Past Surgical History:  Procedure Laterality Date  . BREAST SURGERY  2000   lympectomy on Left Br  . BREAST SURGERY  2005   Right Br Reduction  . CARDIAC CATHETERIZATION  06/29/2007   EF 50-55%  . CARDIAC CATHETERIZATION  11/14/2002   EF 55-60%  . CARDIAC CATHETERIZATION  05/09/2002   EF 45%  . CARDIOVASCULAR STRESS TEST   05/23/10   EF 68%, NO ISCHEMIA     ALLERGIES:  Allergies  Allergen Reactions  . Peanut Oil Anaphylaxis  . Ciprofloxacin Diarrhea  . Codeine      CURRENT MEDICATIONS:  Outpatient Encounter Prescriptions as of 05/15/2016  Medication Sig Note  . ACCU-CHEK SMARTVIEW test strip    . aspirin 81 MG tablet Take 81 mg by mouth daily.    . bisacodyl (BISACODYL LAXATIVE) 10 MG suppository Place 1 suppository (10 mg total) rectally as needed for moderate constipation.   . Chlorphen-Pyril-Phenyleph (TRIPLEX AD PO) Take 1 tablet by mouth daily. 02/10/2014: Received from: [place]:   . Choline Fenofibrate (FENOFIBRIC ACID) 135 MG CPDR Take 135 mcg by mouth daily. 12/13/2015: Received from: External Pharmacy  . diclofenac sodium (VOLTAREN) 1 % GEL Apply 2 g topically 4 (four) times daily as needed. Apply 2-4 grams to affected area 4 times a day  02/21/2015: Received from: External Pharmacy Received Sig: APPLY 2-4 GRAM TO AFFECTED AREA 4 TIMES DAILY AS NEEDED  . docusate sodium (COLACE) 100 MG capsule Take 1 capsule (100 mg total) by mouth every 12 (twelve) hours.   Marland Kitchen escitalopram (LEXAPRO) 10 MG tablet Take 10 mg by mouth as needed. Depression/anxiety.   Marland Kitchen glipiZIDE (GLUCOTROL XL) 2.5 MG 24 hr tablet Take 1 tablet by mouth 2 (two) times daily. 02/10/2014: Received  from: External Pharmacy Received Sig:   . lactulose (CHRONULAC) 10 GM/15ML solution Take 15 mLs (10 g total) by mouth 2 (two) times daily.   Marland Kitchen LEVEMIR FLEXTOUCH 100 UNIT/ML Pen 10 units in the morning and 20 units at night   . LINZESS 145 MCG CAPS capsule Take 145 mcg by mouth every morning. 12/13/2015: Received from: External Pharmacy  . liothyronine (CYTOMEL) 25 MCG tablet Take 25 mcg by mouth every morning.  07/19/2013: Received from: External Pharmacy  . losartan (COZAAR) 50 MG tablet Take 50 mg by mouth daily.  07/19/2013: Received from: External Pharmacy  . metoprolol (LOPRESSOR) 25 MG tablet Take 1 tablet (25 mg total) by  mouth 2 (two) times daily.   . nitrofurantoin (MACRODANTIN) 50 MG capsule Take 1 capsule by mouth as needed. Take 1 capsule, after 5 minutes if no relief take a second capsule and head for the hospital 02/10/2014: Received from: External Pharmacy Received Sig:   . nitroGLYCERIN (NITROSTAT) 0.4 MG SL tablet Place 1 tablet (0.4 mg total) under the tongue every 5 (five) minutes as needed.   . rosuvastatin (CRESTOR) 10 MG tablet Take 10 mg by mouth daily.   Marland Kitchen SYNTHROID 125 MCG tablet Take 1 tablet by mouth daily. 02/10/2014: Received from: External Pharmacy Received Sig:   . ZETIA 10 MG tablet  07/19/2013: Received from: External Pharmacy   No facility-administered encounter medications on file as of 05/15/2016.      ONCOLOGIC FAMILY HISTORY:  Family History  Problem Relation Age of Onset  . Heart attack Mother   . Cancer Mother     Breast  . Heart disease Father   . Stroke Father     GENETIC COUNSELING/TESTING: ***  SOCIAL HISTORY:  KAMISHA ELL is /single/married/divorced/widowed/separated and lives alone/with her spouse/family/friend in (city), [place].  She has (#) children and they live in (city).  Ms. Sunde is currently retired/disabled/working part-time/full-time as ***.  She denies any current or history of tobacco, alcohol, or illicit drug use.     PHYSICAL EXAMINATION:  Vital Signs: There were no vitals filed for this visit. There were no vitals filed for this visit. General: Well-nourished, well-appearing female in no acute distress.  Unaccompanied/Accompanied by***** today.   HEENT: Head is normocephalic.  Pupils equal and reactive to light. Conjunctivae clear without exudate.  Sclerae anicteric. Oral mucosa is pink, moist.  Oropharynx is pink without lesions or erythema.  Lymph: No cervical, supraclavicular, or infraclavicular lymphadenopathy noted on palpation.  Cardiovascular: Regular rate and rhythm.Marland Kitchen Respiratory: Clear to auscultation bilaterally. Chest  expansion symmetric; breathing non-labored.  Breast Exam:  -Left breast: No appreciable masses on palpation. No skin redness, thickening, or peau d'orange appearance; no nipple retraction or nipple discharge; mild distortion in symmetry at previous lumpectomy site***healed scar without erythema or nodularity.  -Right breast: No appreciable masses on palpation. No skin redness, thickening, or peau d'orange appearance; no nipple retraction or nipple discharge; mild distortion in symmetry at previous lumpectomy site***healed scar without erythema or nodularity. -Axilla: No axillary adenopathy bilaterally.  GI: Abdomen soft and round; non-tender, non-distended. Bowel sounds normoactive. No hepatosplenomegaly.   GU: Deferred.  Neuro: No focal deficits. Steady gait.  Psych: Mood and affect normal and appropriate for situation.  Extremities: No edema. Skin: Warm and dry.  LABORATORY DATA:  None for this visit***   DIAGNOSTIC IMAGING:  Most recent mammogram:     ASSESSMENT AND PLAN:  Ms.. Hodge is a pleasant 76 y.o. 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  She presents to the Survivorship Clinic for surveillance and routine follow-up.   1. History of breast cancer:  Ms. Mingle is currently clinically and radiographically without evidence of disease or recurrence of breast cancer. She will be due for mammogram in 04/2016; orders placed today.    I encouraged her to call me with any questions or concerns before her next visit at the cancer center, and I would be happy to see her sooner, if needed.    #. Problem(s) at Visit___________________.  #. Bone health:  Given Ms. Bautch age, history of breast cancer, and her current anti-estrogen therapy with ________, she is at risk for bone demineralization. Her last DEXA scan was on **/**/20**.  In the  meantime, she was encouraged to increase her consumption of foods rich in calcium, as well as increase her weight-bearing activities.  She was given education on specific food and activities to promote bone health.  #. Cancer screening:  Due to Ms. Gillin history and her age, she should receive screening for skin cancers, colon cancer, and ***gynecologic cancers. She was encouraged to follow-up with her PCP for appropriate cancer screenings.   #. Health maintenance and wellness promotion: Ms. Orban was encouraged to consume 5-7 servings of fruits and vegetables per day. She was also encouraged to engage in moderate to vigorous exercise for 30 minutes per day most days of the week. She was instructed to limit her alcohol consumption and continue to abstain from tobacco use/was encouraged stop smoking.  ***    Dispo:  -Return to cancer center ***   A total of (#) minutes of face-to-face time was spent with this patient with greater than 50% of that time in counseling and care-coordination.   Charlestine Massed, NP Survivorship Program [facility] [phone]   Note: PRIMARY CARE PROVIDER Dwan Bolt, [place] [phone]

## 2016-05-14 ENCOUNTER — Telehealth: Payer: Self-pay | Admitting: Adult Health

## 2016-05-14 NOTE — Telephone Encounter (Signed)
Patient called to cancel her appointment tomorrow with Charlestine Massed and will call back to reschedule

## 2016-05-14 NOTE — Telephone Encounter (Signed)
Noted  

## 2016-05-15 ENCOUNTER — Encounter: Payer: Medicare Other | Admitting: Adult Health

## 2016-06-06 ENCOUNTER — Other Ambulatory Visit: Payer: Self-pay | Admitting: Endocrinology

## 2016-06-06 DIAGNOSIS — R911 Solitary pulmonary nodule: Secondary | ICD-10-CM

## 2016-06-12 ENCOUNTER — Other Ambulatory Visit: Payer: Medicare Other

## 2016-06-20 ENCOUNTER — Other Ambulatory Visit: Payer: Medicare Other

## 2016-07-11 DIAGNOSIS — E118 Type 2 diabetes mellitus with unspecified complications: Secondary | ICD-10-CM | POA: Diagnosis not present

## 2016-07-11 DIAGNOSIS — I10 Essential (primary) hypertension: Secondary | ICD-10-CM | POA: Diagnosis not present

## 2016-07-22 DIAGNOSIS — E118 Type 2 diabetes mellitus with unspecified complications: Secondary | ICD-10-CM | POA: Diagnosis not present

## 2016-07-22 DIAGNOSIS — R0689 Other abnormalities of breathing: Secondary | ICD-10-CM | POA: Diagnosis not present

## 2016-07-22 DIAGNOSIS — R0602 Shortness of breath: Secondary | ICD-10-CM | POA: Diagnosis not present

## 2016-07-22 HISTORY — PX: CARDIAC CATHETERIZATION: SHX172

## 2016-07-23 ENCOUNTER — Other Ambulatory Visit: Payer: Self-pay | Admitting: Endocrinology

## 2016-07-23 ENCOUNTER — Other Ambulatory Visit: Payer: Self-pay

## 2016-07-23 ENCOUNTER — Ambulatory Visit (HOSPITAL_COMMUNITY): Payer: Medicare Other | Attending: Cardiology

## 2016-07-23 DIAGNOSIS — I340 Nonrheumatic mitral (valve) insufficiency: Secondary | ICD-10-CM | POA: Insufficient documentation

## 2016-07-23 DIAGNOSIS — E785 Hyperlipidemia, unspecified: Secondary | ICD-10-CM | POA: Insufficient documentation

## 2016-07-23 DIAGNOSIS — I110 Hypertensive heart disease with heart failure: Secondary | ICD-10-CM | POA: Diagnosis not present

## 2016-07-23 DIAGNOSIS — I509 Heart failure, unspecified: Secondary | ICD-10-CM

## 2016-07-23 DIAGNOSIS — E119 Type 2 diabetes mellitus without complications: Secondary | ICD-10-CM | POA: Insufficient documentation

## 2016-07-23 DIAGNOSIS — R0602 Shortness of breath: Secondary | ICD-10-CM

## 2016-07-23 MED ORDER — PERFLUTREN LIPID MICROSPHERE
1.0000 mL | INTRAVENOUS | Status: AC | PRN
  Administered 2016-07-23: 2 mL via INTRAVENOUS

## 2016-07-24 ENCOUNTER — Other Ambulatory Visit: Payer: Self-pay | Admitting: *Deleted

## 2016-07-24 ENCOUNTER — Telehealth: Payer: Self-pay | Admitting: Cardiology

## 2016-07-24 ENCOUNTER — Ambulatory Visit (INDEPENDENT_AMBULATORY_CARE_PROVIDER_SITE_OTHER): Payer: Medicare Other | Admitting: Cardiology

## 2016-07-24 ENCOUNTER — Encounter: Payer: Self-pay | Admitting: Cardiology

## 2016-07-24 VITALS — BP 112/80 | HR 68 | Ht 62.0 in | Wt 200.0 lb

## 2016-07-24 DIAGNOSIS — I420 Dilated cardiomyopathy: Secondary | ICD-10-CM

## 2016-07-24 DIAGNOSIS — R0689 Other abnormalities of breathing: Secondary | ICD-10-CM | POA: Diagnosis not present

## 2016-07-24 DIAGNOSIS — E118 Type 2 diabetes mellitus with unspecified complications: Secondary | ICD-10-CM | POA: Diagnosis not present

## 2016-07-24 DIAGNOSIS — E7800 Pure hypercholesterolemia, unspecified: Secondary | ICD-10-CM

## 2016-07-24 DIAGNOSIS — I2510 Atherosclerotic heart disease of native coronary artery without angina pectoris: Secondary | ICD-10-CM

## 2016-07-24 DIAGNOSIS — I5021 Acute systolic (congestive) heart failure: Secondary | ICD-10-CM

## 2016-07-24 MED ORDER — FUROSEMIDE 40 MG PO TABS: 40.0000 mg | ORAL_TABLET | Freq: Every day | ORAL | 3 refills | Status: DC

## 2016-07-24 MED ORDER — CARVEDILOL 3.125 MG PO TABS: 3.1250 mg | ORAL_TABLET | Freq: Two times a day (BID) | ORAL | 3 refills | Status: AC

## 2016-07-24 NOTE — Patient Instructions (Signed)
Medication Instructions:   START FUROSEMIDE 40 MG ONCE DAILY  STOP METOPROLOL  START CARVEDILOL 3.125 MG ONE TABLET TWICE DAILY  Labwork:  Your physician recommends that you return for lab work ON MONDAY  Follow-Up:  Your physician recommends that you schedule a follow-up appointment in: Monday 07-28-16 WITH APP  Your physician recommends that you schedule a follow-up appointment in: [place]

## 2016-07-24 NOTE — Progress Notes (Signed)
HPI: FU CAD. She has had remote angioplasty of the mid LAD back in 2004. Last cath in 2009 showed a 30-40% left main, 60-70% ostial circumflex which was relatively small, 30-40% proximal LAD. Ejection fraction was 50-55% with mild anterior hypokinesis. Nuclear study June 2015 showed a small anteroapical scar with minimal reversibility. Ejection fraction 61%. Treated medically. Patient seen recently by primary care with complaints of dyspnea. Chest x-ray showed congestive heart failure with small bilateral pleural effusions. Echocardiogram showed ejection fraction 27-25%, grade 2 diastolic dysfunction, mild mitral regurgitation and mild left atrial enlargement. Dr Wilson Singer contacted our office today and she was added to my schedule urgently. Since last seen, over the past 3-4 weeks she has noticed worsening dyspnea on exertion and orthopnea. She denies pedal edema or syncope. She occasionally has a "lightning sensation" of pain in her chest lasting 1-2 seconds but has not had exertional chest pain.  Current Outpatient Prescriptions  Medication Sig Dispense Refill  . ACCU-CHEK SMARTVIEW test strip     . aspirin 81 MG tablet Take 81 mg by mouth daily.     . bisacodyl (BISACODYL LAXATIVE) 10 MG suppository Place 1 suppository (10 mg total) rectally as needed for moderate constipation. 12 suppository 0  . Chlorphen-Pyril-Phenyleph (TRIPLEX AD PO) Take 1 tablet by mouth daily.    . Choline Fenofibrate (FENOFIBRIC ACID) 135 MG CPDR Take 135 mcg by mouth daily.    . diclofenac sodium (VOLTAREN) 1 % GEL Apply 2 g topically 4 (four) times daily as needed. Apply 2-4 grams to affected area 4 times a day   1  . docusate sodium (COLACE) 100 MG capsule Take 1 capsule (100 mg total) by mouth every 12 (twelve) hours. 60 capsule 0  . escitalopram (LEXAPRO) 10 MG tablet Take 10 mg by mouth as needed. Depression/anxiety.    Marland Kitchen glipiZIDE (GLUCOTROL XL) 2.5 MG 24 hr tablet Take 1 tablet by mouth 2 (two) times daily.  0    . lactulose (CHRONULAC) 10 GM/15ML solution Take 15 mLs (10 g total) by mouth 2 (two) times daily. 240 mL 0  . LEVEMIR FLEXTOUCH 100 UNIT/ML Pen 10 units in the morning and 20 units at night    . liothyronine (CYTOMEL) 25 MCG tablet Take 25 mcg by mouth every morning.     Marland Kitchen losartan (COZAAR) 50 MG tablet Take 50 mg by mouth daily.     . metoprolol (LOPRESSOR) 25 MG tablet Take 1 tablet (25 mg total) by mouth 2 (two) times daily. 180 tablet 3  . nitrofurantoin (MACRODANTIN) 50 MG capsule Take 1 capsule by mouth as needed. Take 1 capsule, after 5 minutes if no relief take a second capsule and head for the hospital  3  . nitroGLYCERIN (NITROSTAT) 0.4 MG SL tablet Place 1 tablet (0.4 mg total) under the tongue every 5 (five) minutes as needed. 25 tablet 6  . rosuvastatin (CRESTOR) 10 MG tablet Take 10 mg by mouth daily.    Marland Kitchen SYNTHROID 125 MCG tablet Take 1 tablet by mouth daily.  5  . ZETIA 10 MG tablet      No current facility-administered medications for this visit.      Past Medical History:  Diagnosis Date  . Cancer ([place])    L breast CA  . Depression   . Diabetes mellitus   . GERD (gastroesophageal reflux disease)   . Hyperlipidemia   . Hypertension   . Hypothyroidism   . IHD (ischemic heart disease)  s/p cath in 2009 showing heavily calcified left main with mild to moderate diffuse CAD. Remote angioplasty of the mid LAD in 2004.  Last myoview in 2012 felt to be low risk.   . Obesity     Past Surgical History:  Procedure Laterality Date  . BREAST SURGERY  2000   lympectomy on Left Br  . BREAST SURGERY  2005   Right Br Reduction  . CARDIAC CATHETERIZATION  06/29/2007   EF 50-55%  . CARDIAC CATHETERIZATION  11/14/2002   EF 55-60%  . CARDIAC CATHETERIZATION  05/09/2002   EF 45%  . CARDIOVASCULAR STRESS TEST  05/23/10   EF 68%, NO ISCHEMIA    Social History   Social History  . Marital status: Married    Spouse name: N/A  . Number of children: N/A  . Years of education:  N/A   Occupational History  . Not on file.   Social History Main Topics  . Smoking status: Never Smoker  . Smokeless tobacco: Never Used  . Alcohol use No  . Drug use: No  . Sexual activity: Not on file   Other Topics Concern  . Not on file   Social History Narrative  . No narrative on file    Family History  Problem Relation Age of Onset  . Heart attack Mother   . Cancer Mother     Breast  . Heart disease Father   . Stroke Father     ROS: no fevers or chills, productive cough, hemoptysis, dysphasia, odynophagia, melena, hematochezia, dysuria, hematuria, rash, seizure activity, PND, pedal edema, claudication. Remaining systems are negative.  Physical Exam:obese  Well-developed  in no acute distress.  Skin is warm and dry.  HEENT is normal.  Neck is supple. + JVD Chest is clear to auscultation with normal expansion.  Cardiovascular exam is regular rate and rhythm.  Abdominal exam nontender or distended. No masses palpated. Extremities show no edema. neuro grossly intact  ECG- Sinus rhythm at a rate of 61, left anterior fascicular block, septal infarct, lateral T-wave inversion. personally reviewed  A/P  1 Newly diagnosed cardiomyopathy-patient presents with new onset congestive heart failure symptoms. She has dyspnea on exertion and orthopnea. She has edema and effusions on her chest x-ray. Her echocardiogram shows newly reduced LV function. Continue ARB. Discontinue metoprolol and add carvedilol 3.125 mg twice a day. Titrate medications as tolerated by pulse and blood pressure. Add Lasix 40 mg daily. I will have her seen again on May 7. We will check potassium, renal function and TSH at that time. Note she does not have a history of alcohol use. No recent viral infections. She will require cardiac catheterization to exclude coronary disease as a cause of her newly reduced LV function. If she has improved on May 7 and her renal function is stable catheterization should be  arranged at that time. The risks and benefits were discussed including myocardial infarction, CVA and death and she agrees to proceed.   2 acute systolic congestive heart failure-As outlined above we will add Lasix 40 mg daily. Check potassium and renal function on May 7. Patient instructed on low sodium diet and fluid restriction. If her symptoms worsen I have asked her to be seen in the emergency room for admission.  3 coronary artery disease-continue aspirin and statin.  4 hyperlipidemia-continue statin. Lipids and liver monitored by primary care.  Kirk Ruths, MD

## 2016-07-24 NOTE — Telephone Encounter (Signed)
Dr Stanford Breed spoke with dr Wilson Singer regarding this patient and her recent echo results. Patient will come to the office and see dr Stanford Breed today.

## 2016-07-28 ENCOUNTER — Encounter: Payer: Self-pay | Admitting: Nurse Practitioner

## 2016-07-28 ENCOUNTER — Ambulatory Visit (INDEPENDENT_AMBULATORY_CARE_PROVIDER_SITE_OTHER): Payer: Medicare Other | Admitting: Nurse Practitioner

## 2016-07-28 VITALS — BP 130/74 | HR 62 | Ht 62.0 in | Wt 195.0 lb

## 2016-07-28 DIAGNOSIS — I5021 Acute systolic (congestive) heart failure: Secondary | ICD-10-CM | POA: Diagnosis not present

## 2016-07-28 DIAGNOSIS — I2510 Atherosclerotic heart disease of native coronary artery without angina pectoris: Secondary | ICD-10-CM

## 2016-07-28 NOTE — Patient Instructions (Addendum)
We will be checking the following labs today - BMET, CBC, HPF and BNP, TSH   Medication Instructions:    Continue with your current medicines.   Stay on the 80 mg of Lasix for now.   Increase the Losartan to 50 mg twice a day    Testing/Procedures To Be Arranged:  N/A  Follow-Up:   See me on Wednesday morning    Other Special Instructions:   Restrict salt - no eating out.     If you need a refill on your cardiac medications before your next appointment, please call your pharmacy.   Call the [place] office at [phone] if you have any questions, problems or concerns.

## 2016-07-28 NOTE — Progress Notes (Signed)
CARDIOLOGY OFFICE NOTE  Date:  07/28/2016    Jillian Willis Date of Birth: 10-Jul-1940 Medical Record #[id number]  PCP:  Anda Kraft, MD  Cardiologist:  Cyndra Numbers    Chief Complaint  Patient presents with  . Congestive Heart Failure    Follow up visit - seen for Dr. Stanford Breed    History of Present Illness: Jillian Willis is a 76 y.o. female who presents today for a follow up visit. Seen for Dr. Stanford Breed. She is a former patient of Dr. Susa Simmonds.   She has known CAD with remote angioplasty of the mid LAD back in 2004. Last cath in 2009 showed a 30-40% left main, 60-70% ostial circumflex which was relatively small, 30-40% proximal LAD. Ejection fraction was 50-55% with mild anterior hypokinesis. Nuclear study June 2015 showed a small anteroapical scar with minimal reversibility. Ejection fraction 61%. Treated medically.   Patient seen recently by primary care with complaints of dyspnea. Chest x-ray showed congestive heart failure with small bilateral pleural effusions. Apparently given antibiotics. Echocardiogram showed ejection fraction 23-30%, grade 2 diastolic dysfunction, mild mitral regurgitation and mild left atrial enlargement. She was diuresed.   Comes in today. Here with her husband. Has not felt well for the past 3 weeks but probably more closer to 6 weeks - started noticing a change around Easter. She remains short of breath and continues to have chest pain. Her chest pain is associated with being short of breath - and only with breathing thru her mouth. Walking causes shortness of breath but no chest pain. Says she is "slowly getting better" with the addition of Lasix. She is also on 3 days of antibiotics left. She is on some inhalers as well. She is hoarse. She is taking 80 mg of Lasix. She has a cough - it is dry and not productive. No fever. +PND. 1 pillow orthopnea. This has improved as well. No real swelling but did feel that her belly was bloated - this has  improved. No peripheral edema. She does not some flutters. Not dizzy or lightheaded. No syncope. They do eat out most meals (even [employer]).   Past Medical History:  Diagnosis Date  . Cancer ([place])    L breast CA  . Depression   . Diabetes mellitus   . GERD (gastroesophageal reflux disease)   . Hyperlipidemia   . Hypertension   . Hypothyroidism   . IHD (ischemic heart disease)    s/p cath in 2009 showing heavily calcified left main with mild to moderate diffuse CAD. Remote angioplasty of the mid LAD in 2004.  Last myoview in 2012 felt to be low risk.   . Obesity     Past Surgical History:  Procedure Laterality Date  . BREAST SURGERY  2000   lympectomy on Left Br  . BREAST SURGERY  2005   Right Br Reduction  . CARDIAC CATHETERIZATION  06/29/2007   EF 50-55%  . CARDIAC CATHETERIZATION  11/14/2002   EF 55-60%  . CARDIAC CATHETERIZATION  05/09/2002   EF 45%  . CARDIOVASCULAR STRESS TEST  05/23/10   EF 68%, NO ISCHEMIA     Medications: Current Outpatient Prescriptions  Medication Sig Dispense Refill  . ACCU-CHEK SMARTVIEW test strip     . aspirin 81 MG tablet Take 81 mg by mouth daily.     . bisacodyl (BISACODYL LAXATIVE) 10 MG suppository Place 1 suppository (10 mg total) rectally as needed for moderate constipation. 12 suppository 0  .  carvedilol (COREG) 3.125 MG tablet Take 1 tablet (3.125 mg total) by mouth 2 (two) times daily. 180 tablet 3  . Chlorphen-Pyril-Phenyleph (TRIPLEX AD PO) Take 1 tablet by mouth daily.    . Choline Fenofibrate (FENOFIBRIC ACID) 135 MG CPDR Take 135 mcg by mouth daily.    . clarithromycin (BIAXIN) 500 MG tablet Take 1 tablet by mouth 2 (two) times daily. For 10 days  0  . diclofenac sodium (VOLTAREN) 1 % GEL Apply 2 g topically 4 (four) times daily as needed. Apply 2-4 grams to affected area 4 times a day   1  . docusate sodium (COLACE) 100 MG capsule Take 1 capsule (100 mg total) by mouth every 12 (twelve) hours. 60 capsule 0  . escitalopram  (LEXAPRO) 10 MG tablet Take 10 mg by mouth as needed. Depression/anxiety.    . furosemide (LASIX) 40 MG tablet Take 80 mg by mouth daily.    Marland Kitchen glipiZIDE (GLUCOTROL XL) 2.5 MG 24 hr tablet Take 1 tablet by mouth 2 (two) times daily.  0  . lactulose (CHRONULAC) 10 GM/15ML solution Take 15 mLs (10 g total) by mouth 2 (two) times daily. 240 mL 0  . LEVEMIR FLEXTOUCH 100 UNIT/ML Pen 10 units in the morning and 20 units at night    . liothyronine (CYTOMEL) 25 MCG tablet Take 25 mcg by mouth every morning.     Marland Kitchen losartan (COZAAR) 50 MG tablet Take 50 mg by mouth 2 (two) times daily.    . nitrofurantoin (MACRODANTIN) 50 MG capsule Take 1 capsule by mouth as needed. Take 1 capsule, after 5 minutes if no relief take a second capsule and head for the hospital  3  . nitroGLYCERIN (NITROSTAT) 0.4 MG SL tablet Place 1 tablet (0.4 mg total) under the tongue every 5 (five) minutes as needed. 25 tablet 6  . rosuvastatin (CRESTOR) 10 MG tablet Take 10 mg by mouth daily.    Marland Kitchen SYNTHROID 125 MCG tablet Take 1 tablet by mouth daily.  5  . ZETIA 10 MG tablet Take 10 mg by mouth daily.      No current facility-administered medications for this visit.     Allergies: Allergies  Allergen Reactions  . Peanut Oil Anaphylaxis  . Ciprofloxacin Diarrhea  . Codeine     Social History: The patient  reports that she has never smoked. She has never used smokeless tobacco. She reports that she does not drink alcohol or use drugs.   Family History: The patient's family history includes Cancer in her mother; Heart attack in her mother; Heart disease in her father; Stroke in her father.   Review of Systems: Please see the history of present illness.   Otherwise, the review of systems is positive for none.   All other systems are reviewed and negative.   Physical Exam: VS:  BP 130/74   Pulse 62   Ht 5\' 2"  (1.575 m)   Wt 195 lb (88.5 kg)   SpO2 98%   BMI 35.67 kg/m  .  BMI Body mass index is 35.67 kg/m.  Wt  Readings from Last 3 Encounters:  07/28/16 195 lb (88.5 kg)  07/24/16 200 lb (90.7 kg)  04/25/16 191 lb 3.2 oz (86.7 kg)    General: Pleasant. Elderly female. She looks pale to me. Her voice is hoarse. She is alert and in no acute distress.   HEENT: Normal.  Neck: Supple, no JVD, carotid bruits, or masses noted.  Cardiac: Regular rate and rhythm. Heart tones  are distant. No murmurs, rubs, or gallops. No edema.  Respiratory:  Lungs are fairly clear to auscultation bilaterally with normal work of breathing.  GI: Soft and nontender.  MS: No deformity or atrophy. Gait and ROM intact.  Skin: Warm and dry. Color is normal.  Neuro:  Strength and sensation are intact and no gross focal deficits noted.  Psych: Alert, appropriate and with normal affect.   LABORATORY DATA:  EKG:  EKG is not ordered today.  Lab Results  Component Value Date   WBC 5.2 12/13/2015   HGB 13.5 12/13/2015   HCT 40.0 12/13/2015   PLT 206 12/13/2015   GLUCOSE 141 (H) 12/13/2015   CHOL (H) 05/09/2007    256        ATP III CLASSIFICATION:  <200     mg/dL   Desirable  200-239  mg/dL   Borderline High  >=240    mg/dL   High   TRIG 123 05/09/2007   HDL 57 05/09/2007   LDLCALC (H) 05/09/2007    174        Total Cholesterol/HDL:CHD Risk Coronary Heart Disease Risk Table                     Men   Women  1/2 Average Risk   3.4   3.3   ALT 34 12/13/2015   AST 43 (H) 12/13/2015   NA 139 12/13/2015   K 3.4 (L) 12/13/2015   CL 102 12/13/2015   CREATININE 1.26 (H) 12/13/2015   BUN 21 (H) 12/13/2015   CO2 28 12/13/2015   TSH (H) 05/09/2007    27.708 Test methodology is 3rd generation TSH   HGBA1C (H) 05/09/2007    12.6 (NOTE)   The ADA recommends the following therapeutic goals for glycemic   control related to Hgb A1C measurement:   Goal of Therapy:   < 7.0% Hgb A1C   Action Suggested:  > 8.0% Hgb A1C   Ref:  Diabetes Care, 22, Suppl. 1, 1999    BNP (last 3 results) No results for input(s): BNP in the last  8760 hours.  ProBNP (last 3 results) No results for input(s): PROBNP in the last 8760 hours.   Other Studies Reviewed Today:  Echo Study Conclusions 07/23/2016  - Left ventricle: The cavity size was mildly dilated. Systolic   function was severely reduced. The estimated ejection fraction   was in the range of 20% to 25%. Diffuse hypokinesis. Features are   consistent with a pseudonormal left ventricular filling pattern,   with concomitant abnormal relaxation and increased filling   pressure (grade 2 diastolic dysfunction). Doppler parameters are   consistent with high ventricular filling pressure. - Aortic valve: Valve mobility was restricted. - Mitral valve: Calcified annulus. There was mild regurgitation. - Left atrium: The atrium was mildly dilated. - Pericardium, extracardiac: A trivial pericardial effusion was   identified.  Impressions:  - Definity used; severe global reduction in LV systolic function;   mild LVE; moderate diastolic dysfunction with elevated filing   pressure; mild MR; mild LAE.   Assessment/Plan: 1.  Newly diagnosed cardiomyopathy with systolic HF - EF now 20 to 25%. She is now on low dose beta blocker. HR is in the 60's - may limit how much we can titrate. Will try to increase the ARB today. She is already on Lasix 80 mg. Will continue this dose for now. Lab today to include BMET, HPF, BNP, CBC and TSH. She looks awfully pale to  me today. She says she is improving in regards to her symptoms - albeit slowly. Will see back on Wednesday and hope to proceed with cardiac catheterization. Discussed low salt diet, no eating out - especially [employer] - daily weights. Hope to try and maximize her medicines, add Aldactone, etc. Further disposition to follow.   2. CAD - her chest pain is in the setting of dyspnea and mouth breathing - not exertional - she says this is improving.   3. HLD - on statin  4. HTN - still with room with her BP - will increase ARB today.    5. Hoarseness - may be related to her inhaler - suggested rinsing her mouth after using.    Current medicines are reviewed with the patient today.  The patient does not have concerns regarding medicines other than what has been noted above.  The following changes have been made:  See above.  Labs/ tests ordered today include:    Orders Placed This Encounter  Procedures  . Basic metabolic panel  . CBC  . Hepatic function panel  . Pro b natriuretic peptide (BNP)  . TSH     Disposition:   FU with me on Wednesday.   Patient is agreeable to this plan and will call if any problems develop in the interim.   SignedTruitt Merle, NP  07/28/2016 11:28 AM  [facility] [address] [place], [place]  [address] Phone: [phone] Fax: [phone]

## 2016-07-29 ENCOUNTER — Other Ambulatory Visit: Payer: Self-pay | Admitting: *Deleted

## 2016-07-29 DIAGNOSIS — I429 Cardiomyopathy, unspecified: Secondary | ICD-10-CM

## 2016-07-29 DIAGNOSIS — R079 Chest pain, unspecified: Secondary | ICD-10-CM

## 2016-07-29 LAB — PRO B NATRIURETIC PEPTIDE: NT-Pro BNP: 15991 pg/mL — ABNORMAL HIGH (ref 0–301)

## 2016-07-29 LAB — BASIC METABOLIC PANEL
BUN/Creatinine Ratio: 29 — ABNORMAL HIGH (ref 12–28)
BUN: 60 mg/dL — ABNORMAL HIGH (ref 8–27)
CO2: 27 mmol/L (ref 18–29)
Calcium: 9.2 mg/dL (ref 8.7–10.3)
Chloride: 96 mmol/L (ref 96–106)
Creatinine, Ser: 2.1 mg/dL — ABNORMAL HIGH (ref 0.57–1.00)
GFR calc Af Amer: 26 mL/min/{1.73_m2} — ABNORMAL LOW (ref >59)
GFR calc non Af Amer: 23 mL/min/{1.73_m2} — ABNORMAL LOW (ref >59)
Glucose: 58 mg/dL — ABNORMAL LOW (ref 65–99)
Potassium: 4.2 mmol/L (ref 3.5–5.2)
Sodium: 140 mmol/L (ref 134–144)

## 2016-07-29 LAB — HEPATIC FUNCTION PANEL
ALT: 24 IU/L (ref 0–32)
AST: 46 IU/L — ABNORMAL HIGH (ref 0–40)
Albumin: 3.8 g/dL (ref 3.5–4.8)
Alkaline Phosphatase: 40 IU/L (ref 39–117)
Bilirubin Total: 0.5 mg/dL (ref 0.0–1.2)
Bilirubin, Direct: 0.24 mg/dL (ref 0.00–0.40)
Total Protein: 7 g/dL (ref 6.0–8.5)

## 2016-07-29 LAB — CBC
Hematocrit: 38.3 % (ref 34.0–46.6)
Hemoglobin: 12.5 g/dL (ref 11.1–15.9)
MCH: 30.6 pg (ref 26.6–33.0)
MCHC: 32.6 g/dL (ref 31.5–35.7)
MCV: 94 fL (ref 79–97)
Platelets: 237 10*3/uL (ref 150–379)
RBC: 4.09 x10E6/uL (ref 3.77–5.28)
RDW: 14.4 % (ref 12.3–15.4)
WBC: 8.3 10*3/uL (ref 3.4–10.8)

## 2016-07-29 LAB — TSH: TSH: 12.33 u[IU]/mL — ABNORMAL HIGH (ref 0.450–4.500)

## 2016-07-30 ENCOUNTER — Encounter: Payer: Self-pay | Admitting: Nurse Practitioner

## 2016-07-30 ENCOUNTER — Ambulatory Visit (INDEPENDENT_AMBULATORY_CARE_PROVIDER_SITE_OTHER): Payer: Medicare Other | Admitting: Nurse Practitioner

## 2016-07-30 VITALS — BP 150/78 | HR 61 | Ht 62.0 in | Wt 190.4 lb

## 2016-07-30 DIAGNOSIS — I10 Essential (primary) hypertension: Secondary | ICD-10-CM | POA: Diagnosis not present

## 2016-07-30 DIAGNOSIS — I5021 Acute systolic (congestive) heart failure: Secondary | ICD-10-CM | POA: Diagnosis not present

## 2016-07-30 DIAGNOSIS — I2510 Atherosclerotic heart disease of native coronary artery without angina pectoris: Secondary | ICD-10-CM

## 2016-07-30 DIAGNOSIS — E7800 Pure hypercholesterolemia, unspecified: Secondary | ICD-10-CM

## 2016-07-30 LAB — BASIC METABOLIC PANEL
BUN/Creatinine Ratio: 28 (ref 12–28)
BUN: 53 mg/dL — ABNORMAL HIGH (ref 8–27)
CO2: 25 mmol/L (ref 18–29)
Calcium: 9.3 mg/dL (ref 8.7–10.3)
Chloride: 97 mmol/L (ref 96–106)
Creatinine, Ser: 1.88 mg/dL — ABNORMAL HIGH (ref 0.57–1.00)
GFR calc Af Amer: 30 mL/min/{1.73_m2} — ABNORMAL LOW (ref >59)
GFR calc non Af Amer: 26 mL/min/{1.73_m2} — ABNORMAL LOW (ref >59)
Glucose: 215 mg/dL — ABNORMAL HIGH (ref 65–99)
Potassium: 4.3 mmol/L (ref 3.5–5.2)
Sodium: 140 mmol/L (ref 134–144)

## 2016-07-30 MED ORDER — HYDRALAZINE HCL 25 MG PO TABS: 25.0000 mg | ORAL_TABLET | Freq: Three times a day (TID) | ORAL | 3 refills | Status: AC

## 2016-07-30 MED ORDER — ISOSORBIDE MONONITRATE ER 30 MG PO TB24
30.0000 mg | ORAL_TABLET | Freq: Every day | ORAL | 6 refills | Status: AC
Start: 2016-07-30 — End: ?

## 2016-07-30 NOTE — Patient Instructions (Addendum)
We will be checking the following labs today - BMET   Medication Instructions:    Continue with your current medicines. BUT  Cut the Lasix back to just 40 mg a day  STOP Losartan  START Imdur 30 mg a day  START Hydralazine 25 mg three times a day    Testing/Procedures To Be Arranged:  Lexiscan Myoview  Follow-Up:   See me week after next    Other Special Instructions:   Continue to avoid salt    If you need a refill on your cardiac medications before your next appointment, please call your pharmacy.   Call the [place] office at [phone] if you have any questions, problems or concerns.

## 2016-07-30 NOTE — Progress Notes (Signed)
CARDIOLOGY OFFICE NOTE  Date:  07/30/2016    Jillian Willis Date of Birth: 1941-03-06 Medical Record #[id number]  PCP:  Anda Kraft, MD  Cardiologist:  Cyndra Numbers    Chief Complaint  Patient presents with  . Congestive Heart Failure    Follow up visit - seen for Dr. Stanford Breed.     History of Present Illness: Jillian Willis is a 76 y.o. female who presents today for a follow up visit. Seen for Dr. Stanford Breed.   She is a former patient of Dr. Susa Simmonds.   She has known CAD with remote angioplasty of the mid LAD back in 2004. Last cath in 2009 showed a 30-40% left main, 60-70% ostial circumflex which was relatively small, 30-40% proximal LAD. Ejection fraction was 50-55% with mild anterior hypokinesis. Nuclear study June 2015 showed a small anteroapical scar with minimal reversibility. Ejection fraction 61%. Treated medically.   Patient seen recently by primary care with complaints of dyspnea. Chest x-ray showed congestive heart failure with small bilateral pleural effusions. Apparently given antibiotics. Echocardiogram showed ejection fraction 60-10%, grade 2 diastolic dysfunction, mild mitral regurgitation and mild left atrial enlargement. She was diuresed. Also placed on antibiotics.   I then saw her this past Monday - was doing a little better - plan was to try and get her set up for cardiac cath. Labs however show worsening kidney function yet with very high BNP. Myoview now to be ordered.   Comes in today. Here with her husband. She says she is continuing to feel better. Her breathing has improved. No chest pain. She has been able to do a little more around the house. She has lost 5 more pounds. Her dose of Lasix was NOT cut back yesterday. Her voice is a little better - she has stopped her inhaler and finishes her antibiotic today.  She has cut back her salt. Overall, she is better.   Past Medical History:  Diagnosis Date  . Cancer ([place])    L breast CA  .  Depression   . Diabetes mellitus   . GERD (gastroesophageal reflux disease)   . Hyperlipidemia   . Hypertension   . Hypothyroidism   . IHD (ischemic heart disease)    s/p cath in 2009 showing heavily calcified left main with mild to moderate diffuse CAD. Remote angioplasty of the mid LAD in 2004.  Last myoview in 2012 felt to be low risk.   . Obesity     Past Surgical History:  Procedure Laterality Date  . BREAST SURGERY  2000   lympectomy on Left Br  . BREAST SURGERY  2005   Right Br Reduction  . CARDIAC CATHETERIZATION  06/29/2007   EF 50-55%  . CARDIAC CATHETERIZATION  11/14/2002   EF 55-60%  . CARDIAC CATHETERIZATION  05/09/2002   EF 45%  . CARDIOVASCULAR STRESS TEST  05/23/10   EF 68%, NO ISCHEMIA     Medications: Current Outpatient Prescriptions  Medication Sig Dispense Refill  . ACCU-CHEK SMARTVIEW test strip     . aspirin 81 MG tablet Take 81 mg by mouth daily.     . bisacodyl (BISACODYL LAXATIVE) 10 MG suppository Place 1 suppository (10 mg total) rectally as needed for moderate constipation. 12 suppository 0  . carvedilol (COREG) 3.125 MG tablet Take 1 tablet (3.125 mg total) by mouth 2 (two) times daily. 180 tablet 3  . Chlorphen-Pyril-Phenyleph (TRIPLEX AD PO) Take 1 tablet by mouth daily.    . Choline  Fenofibrate (FENOFIBRIC ACID) 135 MG CPDR Take 135 mcg by mouth daily.    . clarithromycin (BIAXIN) 500 MG tablet Take 1 tablet by mouth 2 (two) times daily. For 10 days  0  . diclofenac sodium (VOLTAREN) 1 % GEL Apply 2 g topically 4 (four) times daily as needed. Apply 2-4 grams to affected area 4 times a day   1  . docusate sodium (COLACE) 100 MG capsule Take 1 capsule (100 mg total) by mouth every 12 (twelve) hours. 60 capsule 0  . escitalopram (LEXAPRO) 10 MG tablet Take 10 mg by mouth as needed. Depression/anxiety.    . furosemide (LASIX) 40 MG tablet Take 40 mg by mouth daily.    Marland Kitchen glipiZIDE (GLUCOTROL XL) 2.5 MG 24 hr tablet Take 1 tablet by mouth 2 (two) times  daily.  0  . lactulose (CHRONULAC) 10 GM/15ML solution Take 15 mLs (10 g total) by mouth 2 (two) times daily. 240 mL 0  . LEVEMIR FLEXTOUCH 100 UNIT/ML Pen 10 units in the morning and 20 units at night    . liothyronine (CYTOMEL) 25 MCG tablet Take 25 mcg by mouth every morning.     . nitrofurantoin (MACRODANTIN) 50 MG capsule Take 1 capsule by mouth as needed. Take 1 capsule, after 5 minutes if no relief take a second capsule and head for the hospital  3  . nitroGLYCERIN (NITROSTAT) 0.4 MG SL tablet Place 1 tablet (0.4 mg total) under the tongue every 5 (five) minutes as needed. 25 tablet 6  . rosuvastatin (CRESTOR) 10 MG tablet Take 10 mg by mouth daily.    Marland Kitchen SYNTHROID 125 MCG tablet Take 1 tablet by mouth daily.  5  . ZETIA 10 MG tablet Take 10 mg by mouth daily.     . hydrALAZINE (APRESOLINE) 25 MG tablet Take 1 tablet (25 mg total) by mouth 3 (three) times daily. 90 tablet 3  . isosorbide mononitrate (IMDUR) 30 MG 24 hr tablet Take 1 tablet (30 mg total) by mouth daily. 30 tablet 6   No current facility-administered medications for this visit.     Allergies: Allergies  Allergen Reactions  . Peanut Oil Anaphylaxis  . Ciprofloxacin Diarrhea  . Codeine     Social History: The patient  reports that she has never smoked. She has never used smokeless tobacco. She reports that she does not drink alcohol or use drugs.   Family History: The patient's family history includes Cancer in her mother; Heart attack in her mother; Heart disease in her father; Stroke in her father.   Review of Systems: Please see the history of present illness.   Otherwise, the review of systems is positive for none.   All other systems are reviewed and negative.   Physical Exam: VS:  BP (!) 150/78 (BP Location: Right Arm, Patient Position: Sitting, Cuff Size: Normal)   Pulse 61   Ht 5\' 2"  (1.575 m)   Wt 190 lb 6.4 oz (86.4 kg)   SpO2 97% Comment: at rest  BMI 34.82 kg/m  .  BMI Body mass index is 34.82  kg/m.  Wt Readings from Last 3 Encounters:  07/30/16 190 lb 6.4 oz (86.4 kg)  07/28/16 195 lb (88.5 kg)  07/24/16 200 lb (90.7 kg)    General: Pleasant. She looks better today. Not as pale. She is alert and in no acute distress.   HEENT: Normal.  Neck: Supple, no JVD, carotid bruits, or masses noted. Her neck is thick.  Cardiac: Regular rate  and rhythm. Heart tones are distant. No edema.  Respiratory:  Lungs are clear to auscultation bilaterally with normal work of breathing.  GI: Soft and nontender.  MS: No deformity or atrophy. Gait and ROM intact.  Skin: Warm and dry. Color is normal.  Neuro:  Strength and sensation are intact and no gross focal deficits noted.  Psych: Alert, appropriate and with normal affect.   LABORATORY DATA:  EKG:  EKG is not ordered today.  Lab Results  Component Value Date   WBC 8.3 07/28/2016   HGB 13.5 12/13/2015   HCT 38.3 07/28/2016   PLT 237 07/28/2016   GLUCOSE 58 (L) 07/28/2016   CHOL (H) 05/09/2007    256        ATP III CLASSIFICATION:  <200     mg/dL   Desirable  200-239  mg/dL   Borderline High  >=240    mg/dL   High   TRIG 123 05/09/2007   HDL 57 05/09/2007   LDLCALC (H) 05/09/2007    174        Total Cholesterol/HDL:CHD Risk Coronary Heart Disease Risk Table                     Men   Women  1/2 Average Risk   3.4   3.3   ALT 24 07/28/2016   AST 46 (H) 07/28/2016   NA 140 07/28/2016   K 4.2 07/28/2016   CL 96 07/28/2016   CREATININE 2.10 (H) 07/28/2016   BUN 60 (H) 07/28/2016   CO2 27 07/28/2016   TSH 12.330 (H) 07/28/2016   HGBA1C (H) 05/09/2007    12.6 (NOTE)   The ADA recommends the following therapeutic goals for glycemic   control related to Hgb A1C measurement:   Goal of Therapy:   < 7.0% Hgb A1C   Action Suggested:  > 8.0% Hgb A1C   Ref:  Diabetes Care, 22, Suppl. 1, 1999    BNP (last 3 results) No results for input(s): BNP in the last 8760 hours.  ProBNP (last 3 results)  Recent Labs  07/28/16 1135    PROBNP 15,991*     Other Studies Reviewed Today:  Echo Study Conclusions 07/23/2016  - Left ventricle: The cavity size was mildly dilated. Systolic function was severely reduced. The estimated ejection fraction was in the range of 20% to 25%. Diffuse hypokinesis. Features are consistent with a pseudonormal left ventricular filling pattern, with concomitant abnormal relaxation and increased filling pressure (grade 2 diastolic dysfunction). Doppler parameters are consistent with high ventricular filling pressure. - Aortic valve: Valve mobility was restricted. - Mitral valve: Calcified annulus. There was mild regurgitation. - Left atrium: The atrium was mildly dilated. - Pericardium, extracardiac: A trivial pericardial effusion was identified.  Impressions:  - Definity used; severe global reduction in LV systolic function; mild LVE; moderate diastolic dysfunction with elevated filing pressure; mild MR; mild LAE.   Assessment/Plan:  1. Newly diagnosed cardiomyopathy with systolic HF - EF now 20 to 25%. She is now on low dose beta blocker. ARB was increased on Monday. Cutting her Lasix back due to worsening kidney function - this is going to impact her CHF regimen - I am now also stopping the ARB and changing to Imdur/Hydralazine. Arranging for Myoview. She has expressed that she wants to follow back with me going forward.   2. CAD - her chest pain is in the setting of dyspnea and mouth breathing - not exertional - she says this is  improving. Will arrange for Myoview.   3. HLD - on statin therapy  4. HTN - ARB was increased on Monday but given her kidney function - will now be changing to Hydralazine and nitrate therapy  5. Hoarseness - may be related to her inhaler - suggested rinsing her mouth after using.  She has stopped the inhaler.   6. Worsening CKD - recheck today. Stopping ARB, cutting back Lasix. Will plan to recheck BMET on Tuesday when she  comes for her Myoview.   Current medicines are reviewed with the patient today.  The patient does not have concerns regarding medicines other than what has been noted above.  The following changes have been made:  See above.  Labs/ tests ordered today include:    Orders Placed This Encounter  Procedures  . Basic metabolic panel  . Basic metabolic panel  . Myocardial Perfusion Imaging     Disposition:   FU with me week after next per patient request. Husband is also wanting to see me back as well - appointment made.   Patient is agreeable to this plan and will call if any problems develop in the interim.   SignedTruitt Merle, NP  07/30/2016 8:22 AM  [facility] [address] [place], [place]  [address] Phone: [phone] Fax: [phone]

## 2016-07-31 ENCOUNTER — Telehealth (HOSPITAL_COMMUNITY): Payer: Self-pay | Admitting: *Deleted

## 2016-07-31 NOTE — Telephone Encounter (Signed)
Patient given detailed instructions per Myocardial Perfusion Study Information Sheet for the test on 08/05/16 at 0715. Patient notified to arrive 15 minutes early and that it is imperative to arrive on time for appointment to keep from having the test rescheduled.  If you need to cancel or reschedule your appointment, please call the office within 24 hours of your appointment. Failure to do so may result in a cancellation of your appointment, and a $50 no show fee. Patient verbalized understanding.[name], Ranae Palms

## 2016-08-01 ENCOUNTER — Telehealth: Payer: Self-pay | Admitting: Nurse Practitioner

## 2016-08-01 NOTE — Telephone Encounter (Signed)
New Message    Pt calling about lab results

## 2016-08-05 ENCOUNTER — Other Ambulatory Visit: Payer: Medicare Other | Admitting: *Deleted

## 2016-08-05 ENCOUNTER — Ambulatory Visit (HOSPITAL_COMMUNITY): Payer: Medicare Other | Attending: Cardiology

## 2016-08-05 DIAGNOSIS — R0609 Other forms of dyspnea: Secondary | ICD-10-CM | POA: Diagnosis not present

## 2016-08-05 DIAGNOSIS — Z8249 Family history of ischemic heart disease and other diseases of the circulatory system: Secondary | ICD-10-CM | POA: Insufficient documentation

## 2016-08-05 DIAGNOSIS — E119 Type 2 diabetes mellitus without complications: Secondary | ICD-10-CM | POA: Diagnosis not present

## 2016-08-05 DIAGNOSIS — I110 Hypertensive heart disease with heart failure: Secondary | ICD-10-CM | POA: Diagnosis not present

## 2016-08-05 DIAGNOSIS — I2510 Atherosclerotic heart disease of native coronary artery without angina pectoris: Secondary | ICD-10-CM

## 2016-08-05 DIAGNOSIS — I5021 Acute systolic (congestive) heart failure: Secondary | ICD-10-CM

## 2016-08-05 DIAGNOSIS — R9439 Abnormal result of other cardiovascular function study: Secondary | ICD-10-CM | POA: Diagnosis not present

## 2016-08-05 DIAGNOSIS — I5020 Unspecified systolic (congestive) heart failure: Secondary | ICD-10-CM | POA: Diagnosis not present

## 2016-08-05 DIAGNOSIS — I429 Cardiomyopathy, unspecified: Secondary | ICD-10-CM | POA: Insufficient documentation

## 2016-08-05 LAB — BASIC METABOLIC PANEL
BUN/Creatinine Ratio: 33 — ABNORMAL HIGH (ref 12–28)
BUN: 59 mg/dL — ABNORMAL HIGH (ref 8–27)
CO2: 22 mmol/L (ref 18–29)
Calcium: 8.8 mg/dL (ref 8.7–10.3)
Chloride: 99 mmol/L (ref 96–106)
Creatinine, Ser: 1.8 mg/dL — ABNORMAL HIGH (ref 0.57–1.00)
GFR calc Af Amer: 32 mL/min/{1.73_m2} — ABNORMAL LOW (ref >59)
GFR calc non Af Amer: 27 mL/min/{1.73_m2} — ABNORMAL LOW (ref >59)
Glucose: 162 mg/dL — ABNORMAL HIGH (ref 65–99)
Potassium: 4 mmol/L (ref 3.5–5.2)
Sodium: 138 mmol/L (ref 134–144)

## 2016-08-05 LAB — MYOCARDIAL PERFUSION IMAGING
LV dias vol: 178 mL (ref 46–106)
LV sys vol: 144 mL
Peak HR: 70 {beats}/min
RATE: 0.46
Rest HR: 64 {beats}/min
SDS: 4
SRS: 34
SSS: 38
TID: 1.04

## 2016-08-05 MED ORDER — TECHNETIUM TC 99M TETROFOSMIN IV KIT
10.6000 | PACK | Freq: Once | INTRAVENOUS | Status: AC | PRN
  Administered 2016-08-05: 10.6 via INTRAVENOUS
  Filled 2016-08-05: qty 11

## 2016-08-05 MED ORDER — REGADENOSON 0.4 MG/5ML IV SOLN
0.4000 mg | Freq: Once | INTRAVENOUS | Status: AC
  Administered 2016-08-05: 0.4 mg via INTRAVENOUS

## 2016-08-05 MED ORDER — TECHNETIUM TC 99M TETROFOSMIN IV KIT
33.0000 | PACK | Freq: Once | INTRAVENOUS | Status: AC | PRN
  Administered 2016-08-05: 33 via INTRAVENOUS
  Filled 2016-08-05: qty 33

## 2016-08-06 NOTE — Telephone Encounter (Signed)
New message ° ° ° °Pt is returning call to Danielle. °

## 2016-08-07 DIAGNOSIS — I509 Heart failure, unspecified: Secondary | ICD-10-CM | POA: Diagnosis not present

## 2016-08-07 DIAGNOSIS — E118 Type 2 diabetes mellitus with unspecified complications: Secondary | ICD-10-CM | POA: Diagnosis not present

## 2016-08-13 ENCOUNTER — Ambulatory Visit (INDEPENDENT_AMBULATORY_CARE_PROVIDER_SITE_OTHER): Payer: Medicare Other | Admitting: Nurse Practitioner

## 2016-08-13 ENCOUNTER — Encounter: Payer: Self-pay | Admitting: Nurse Practitioner

## 2016-08-13 VITALS — BP 132/62 | HR 55 | Ht 62.0 in | Wt 188.8 lb

## 2016-08-13 DIAGNOSIS — I10 Essential (primary) hypertension: Secondary | ICD-10-CM | POA: Diagnosis not present

## 2016-08-13 DIAGNOSIS — I5021 Acute systolic (congestive) heart failure: Secondary | ICD-10-CM | POA: Diagnosis not present

## 2016-08-13 DIAGNOSIS — I2510 Atherosclerotic heart disease of native coronary artery without angina pectoris: Secondary | ICD-10-CM | POA: Diagnosis not present

## 2016-08-13 DIAGNOSIS — E7800 Pure hypercholesterolemia, unspecified: Secondary | ICD-10-CM

## 2016-08-13 DIAGNOSIS — I420 Dilated cardiomyopathy: Secondary | ICD-10-CM | POA: Diagnosis not present

## 2016-08-13 NOTE — Patient Instructions (Addendum)
We will be checking the following labs today - BMET, CBC, PT, PTT   Medication Instructions:    Continue with your current medicines.     Testing/Procedures To Be Arranged:  Cardiac catheterization with Dr. Burt Knack next Wednesday  Follow-Up:   See me in about 2 weeks post cath    Other Special Instructions:   Your provider has recommended a cardiac catherization  You are scheduled for a cardiac catheterization on Wednesday, May 30th at 2:30 PM with Dr. Burt Knack or associate.  Please arrive at the [facility] (Main Entrance) at [facility] at [address], [place] on Wednesday, May 30th at 12:30PM.    Special note: Every effort is made to have your procedure done on time.   Please understand that emergencies sometimes delay a scheduled   procedure.  No food or drink after 7 AM on Wednesday May 30th - you may have some clear liquids prior to 7AM.  You may take your morning medications with a sip of water on the day of your procedure except - HOLD you Lasix, insulin and Glucotrol. Take just 1/2 dose of your night time insulin on Tuesday night    Plan for a one night stay -- bring personal belongings.  Bring a current list of your medications and current insurance cards.  You MUST have a responsible person to drive you home. Someone MUST be with you the first 24 hours after you arrive home or your discharge will be delayed. Wear clothes that are easy to get on and off and wear slip on shoes.    Coronary Angiogram A coronary angiogram, also called coronary angiography, is an X-ray procedure used to look at the arteries in the heart. In this procedure, a dye (contrast dye) is injected through a long, hollow tube (catheter). The catheter is about the size of a piece of cooked spaghetti and is inserted through your groin, wrist, or arm. The dye is injected into each artery, and X-rays are then taken to show if there is a blockage in the arteries  of your heart.  LET [facility] CARE PROVIDER KNOW ABOUT: Any allergies you have, including allergies to shellfish or contrast dye.  All medicines you are taking, including vitamins, herbs, eye drops, creams, and over-the-counter medicines.  Previous problems you or members of your family have had with the use of anesthetics.  Any blood disorders you have.  Previous surgeries you have had. History of kidney problems or failure.  Other medical conditions you have.  RISKS AND COMPLICATIONS  Generally, a coronary angiogram is a safe procedure. However, about 1 person out of 1000 can have problems that may include: Allergic reaction to the dye. Bleeding/bruising from the access site or other locations. Kidney injury, especially in people with impaired kidney function. Stroke (rare). Heart attack (rare). Irregular rhythms (rare) Death (rare)  BEFORE THE PROCEDURE  Do not eat or drink anything after midnight the night before the procedure or as directed by your health care provider.  Ask your health care provider about changing or stopping your regular medicines. This is especially important if you are taking diabetes medicines or blood thinners.  PROCEDURE You may be given a medicine to help you relax (sedative) before the procedure. This medicine is given through an intravenous (IV) access tube that is inserted into one of your veins.  The area where the catheter will be inserted will be washed and shaved. This is usually done  in the groin but may be done in the fold of your arm (near your elbow) or in the wrist.  A medicine will be given to numb the area where the catheter will be inserted (local anesthetic).  The health care provider will insert the catheter into an artery. The catheter will be guided by using a special type of X-ray (fluoroscopy) of the blood vessel being examined.  A special dye will then be injected into the catheter, and X-rays will be taken. The dye will  help to show where any narrowing or blockages are located in the heart arteries.    AFTER THE PROCEDURE  If the procedure is done through the leg, you will be kept in bed lying flat for several hours. You will be instructed to not bend or cross your legs. The insertion site will be checked frequently.  The pulse in your feet or wrist will be checked frequently.  Additional blood tests, X-rays, and an electrocardiogram may be done.         If you need a refill on your cardiac medications before your next appointment, please call your pharmacy.   Call the [place] office at [phone] if you have any questions, problems or concerns.

## 2016-08-13 NOTE — Progress Notes (Signed)
CARDIOLOGY OFFICE NOTE  Date:  08/13/2016    Jillian Willis Date of Birth: 1940/10/13 Medical Record #[id number]  PCP:  Anda Kraft, MD  Cardiologist:  Cyndra Numbers   Chief Complaint  Patient presents with  . Cardiomyopathy    Follow up visit - seen for Dr. Stanford Breed    History of Present Illness: Jillian Willis is a 76 y.o. female who presents today for a follow up visit. Seen for Dr. Stanford Breed. She is a former patient of Dr. Susa Simmonds. She has expressed her desire to follow with me going forward.   She has known CAD with remote angioplasty of the mid LAD back in 2004. Last cath in 2009 showed a 30-40% left main, 60-70% ostial circumflex which was relatively small, 30-40% proximal LAD. Ejection fraction was 50-55% with mild anterior hypokinesis. Nuclear study June 2015 showed a small anteroapical scar with minimal reversibility. Ejection fraction 61%. Treated medically.   Patient seen recently by primary care with complaints of dyspnea. Chest x-ray showed congestive heart failure with small bilateral pleural effusions. Apparently given antibiotics. Echocardiogram showed ejection fraction 78-24%, grade 2 diastolic dysfunction, mild mitral regurgitation and mild left atrial enlargement. She was diuresed. Also placed on antibiotics. Seen initially by Dr. Stanford Breed and then by me - our plan was to try and tune her up and get her set up for cardiac cath - however labs showed worsening kidney function with very high BNP - Myoview then ordered - this was quite abnormal. She was actually feeling better on her last visit with me clinically. Her Lasix has been cut back, ARB stopped and placed on nitrates/hydralazine.   Comes in today. Here with her husband. Says she is feeling better. Her legs feel heavy at times. She remains fatigued. Her breathing has improved but she still has DOE with minimal activities. She is able to do more of her house activities but is not back to what she  was able to do say this past Christmas - she is not getting out and shopping as she was a few months ago. She still has this chest heaviness - when she exerts and when she is short of breath. Has to rest. Her weight is down 2 pounds. She has been referred to a kidney doctor - seeing them in about 2 weeks.   Past Medical History:  Diagnosis Date  . Cancer ([place])    L breast CA  . Depression   . Diabetes mellitus   . GERD (gastroesophageal reflux disease)   . Hyperlipidemia   . Hypertension   . Hypothyroidism   . IHD (ischemic heart disease)    s/p cath in 2009 showing heavily calcified left main with mild to moderate diffuse CAD. Remote angioplasty of the mid LAD in 2004.  Last myoview in 2012 felt to be low risk.   . Obesity     Past Surgical History:  Procedure Laterality Date  . BREAST SURGERY  2000   lympectomy on Left Br  . BREAST SURGERY  2005   Right Br Reduction  . CARDIAC CATHETERIZATION  06/29/2007   EF 50-55%  . CARDIAC CATHETERIZATION  11/14/2002   EF 55-60%  . CARDIAC CATHETERIZATION  05/09/2002   EF 45%  . CARDIOVASCULAR STRESS TEST  05/23/10   EF 68%, NO ISCHEMIA     Medications: Current Outpatient Prescriptions  Medication Sig Dispense Refill  . ACCU-CHEK SMARTVIEW test strip     . aspirin 81 MG tablet Take 81  mg by mouth daily.     . bisacodyl (BISACODYL LAXATIVE) 10 MG suppository Place 1 suppository (10 mg total) rectally as needed for moderate constipation. 12 suppository 0  . carvedilol (COREG) 3.125 MG tablet Take 1 tablet (3.125 mg total) by mouth 2 (two) times daily. 180 tablet 3  . Chlorphen-Pyril-Phenyleph (TRIPLEX AD PO) Take 1 tablet by mouth daily.    . Choline Fenofibrate (FENOFIBRIC ACID) 135 MG CPDR Take 135 mcg by mouth daily.    . clarithromycin (BIAXIN) 500 MG tablet Take 1 tablet by mouth 2 (two) times daily. For 10 days  0  . diclofenac sodium (VOLTAREN) 1 % GEL Apply 2 g topically 4 (four) times daily as needed. Apply 2-4 grams to affected  area 4 times a day   1  . docusate sodium (COLACE) 100 MG capsule Take 1 capsule (100 mg total) by mouth every 12 (twelve) hours. 60 capsule 0  . escitalopram (LEXAPRO) 10 MG tablet Take 10 mg by mouth as needed. Depression/anxiety.    . furosemide (LASIX) 40 MG tablet Take 40 mg by mouth daily.    Marland Kitchen glipiZIDE (GLUCOTROL XL) 2.5 MG 24 hr tablet Take 1 tablet by mouth 2 (two) times daily.  0  . hydrALAZINE (APRESOLINE) 25 MG tablet Take 1 tablet (25 mg total) by mouth 3 (three) times daily. 90 tablet 3  . isosorbide mononitrate (IMDUR) 30 MG 24 hr tablet Take 1 tablet (30 mg total) by mouth daily. 30 tablet 6  . lactulose (CHRONULAC) 10 GM/15ML solution Take 15 mLs (10 g total) by mouth 2 (two) times daily. 240 mL 0  . LEVEMIR FLEXTOUCH 100 UNIT/ML Pen 10 units in the morning and 20 units at night    . liothyronine (CYTOMEL) 25 MCG tablet Take 25 mcg by mouth every morning.     . nitrofurantoin (MACRODANTIN) 50 MG capsule Take 1 capsule by mouth as needed. Take 1 capsule, after 5 minutes if no relief take a second capsule and head for the hospital  3  . nitroGLYCERIN (NITROSTAT) 0.4 MG SL tablet Place 1 tablet (0.4 mg total) under the tongue every 5 (five) minutes as needed. 25 tablet 6  . rosuvastatin (CRESTOR) 10 MG tablet Take 10 mg by mouth daily.    Marland Kitchen SYNTHROID 125 MCG tablet Take 1 tablet by mouth daily.  5  . ZETIA 10 MG tablet Take 10 mg by mouth daily.      No current facility-administered medications for this visit.     Allergies: Allergies  Allergen Reactions  . Peanut Oil Anaphylaxis  . Ciprofloxacin Diarrhea  . Codeine     Social History: The patient  reports that she has never smoked. She has never used smokeless tobacco. She reports that she does not drink alcohol or use drugs.   Family History: The patient's family history includes Cancer in her mother; Heart attack in her mother; Heart disease in her father; Stroke in her father.   Review of Systems: Please see the  history of present illness.   Otherwise, the review of systems is positive for none.   All other systems are reviewed and negative.   Physical Exam: VS:  BP 132/62 (BP Location: Right Arm, Patient Position: Sitting, Cuff Size: Normal)   Pulse (!) 55   Ht 5\' 2"  (1.575 m)   Wt 188 lb 12.8 oz (85.6 kg)   SpO2 97% Comment: at rest  BMI 34.53 kg/m  .  BMI Body mass index is 34.53 kg/m.  Wt Readings from Last 3 Encounters:  08/13/16 188 lb 12.8 oz (85.6 kg)  08/05/16 190 lb (86.2 kg)  07/30/16 190 lb 6.4 oz (86.4 kg)    General: Pleasant. She is alert and in no acute distress.  Color always looks pale.  HEENT: Normal.  Neck: Supple, no JVD, carotid bruits, or masses noted.  Cardiac: Regular rate and rhythm. No murmurs, rubs, or gallops. No edema.  Respiratory:  Lungs are clear to auscultation bilaterally with normal work of breathing.  GI: Soft and nontender.  MS: No deformity or atrophy. Gait and ROM intact.  Skin: Warm and dry. Color is normal.  Neuro:  Strength and sensation are intact and no gross focal deficits noted.  Psych: Alert, appropriate and with normal affect.   LABORATORY DATA:  EKG:  EKG is not ordered today.  Lab Results  Component Value Date   WBC 8.3 07/28/2016   HGB 13.5 12/13/2015   HCT 38.3 07/28/2016   PLT 237 07/28/2016   GLUCOSE 162 (H) 08/05/2016   CHOL (H) 05/09/2007    256        ATP III CLASSIFICATION:  <200     mg/dL   Desirable  200-239  mg/dL   Borderline High  >=240    mg/dL   High   TRIG 123 05/09/2007   HDL 57 05/09/2007   LDLCALC (H) 05/09/2007    174        Total Cholesterol/HDL:CHD Risk Coronary Heart Disease Risk Table                     Men   Women  1/2 Average Risk   3.4   3.3   ALT 24 07/28/2016   AST 46 (H) 07/28/2016   NA 138 08/05/2016   K 4.0 08/05/2016   CL 99 08/05/2016   CREATININE 1.80 (H) 08/05/2016   BUN 59 (H) 08/05/2016   CO2 22 08/05/2016   TSH 12.330 (H) 07/28/2016   HGBA1C (H) 05/09/2007     12.6 (NOTE)   The ADA recommends the following therapeutic goals for glycemic   control related to Hgb A1C measurement:   Goal of Therapy:   < 7.0% Hgb A1C   Action Suggested:  > 8.0% Hgb A1C   Ref:  Diabetes Care, 22, Suppl. 1, 1999    BNP (last 3 results) No results for input(s): BNP in the last 8760 hours.  ProBNP (last 3 results)  Recent Labs  07/28/16 1135  PROBNP 15,991*     Other Studies Reviewed Today:  Myoview Study Highlights 07/2016    The left ventricular ejection fraction is severely decreased (<30%).  Nuclear stress EF: 19%.  No T wave inversion was noted during stress.  There was no ST segment deviation noted during stress.  Defect 1: There is a large defect of severe severity.  Findings consistent with prior myocardial infarction.  This is a high risk study.   Large size, severe severity fixed anterior, septal, inferior and apical perfusion defect with sparing of the basal anterior and lateral wall, consistent with scar. Moderately dilated ventricle with LVEF 19% and severe global hypokinesis with preserved lateral wall motion. This is a high risk study.    Echo Study Conclusions 07/23/2016  - Left ventricle: The cavity size was mildly dilated. Systolic function was severely reduced. The estimated ejection fraction was in the range of 20% to 25%. Diffuse hypokinesis. Features are consistent with a pseudonormal left ventricular filling pattern, with concomitant abnormal  relaxation and increased filling pressure (grade 2 diastolic dysfunction). Doppler parameters are consistent with high ventricular filling pressure. - Aortic valve: Valve mobility was restricted. - Mitral valve: Calcified annulus. There was mild regurgitation. - Left atrium: The atrium was mildly dilated. - Pericardium, extracardiac: A trivial pericardial effusion was identified.  Impressions:  - Definity used; severe global reduction in LV systolic  function; mild LVE; moderate diastolic dysfunction with elevated filing pressure; mild MR; mild LAE.   Assessment/Plan:  1. Newly diagnosed cardiomyopathy with systolic HF - EF now 20 to 25%. She is now on low dose beta blocker. We have had to stop her ARB and she is now on Imdur/Hydralazine. Her Myoview is quite abnormal. She has been subsequently seen with Dr. Burt Knack here in the office this morning. We suspect that her kidney function has improved due to last med changes - would like to proceed on with left and right cardiac catheterization but this will be a diagnostic study only. She understands that she may need a staged procedure. She understands that limited xray dye will be used. No plans for LV gram. The patient understands that risks include but are not limited to stroke (1 in 1000), death (1 in 1000), kidney failure [usually temporary] (1 in 500), bleeding (1 in 200), allergic reaction [possibly serious] (1 in 200), and agrees to proceed.   2. CAD - her chest pain remains present - Myoview is abnormal - arranging for diagnostic cardiac cath with Dr. Burt Knack.   3. HLD - on statin therapy  4. HTN - BP is currently fine - no changes made today.   5. Hoarseness- this seems resolved - I suspect this was due to her use of the inhaler.   6. Worsening CKD - recheck today. No longer on her ARB and Lasix was cut back. She has been referred to nephrology. Labs today.   Current medicines are reviewed with the patient today.  The patient does not have concerns regarding medicines other than what has been noted above.  The following changes have been made:  See above.  Labs/ tests ordered today include:   No orders of the defined types were placed in this encounter.    Disposition:   FU with me in 2 weeks post cath.   Patient is agreeable to this plan and will call if any problems develop in the interim.   SignedTruitt Merle, NP  08/13/2016 8:45 AM  [facility] [address] [place], [place]  [address] Phone: [phone] Fax: [phone]

## 2016-08-14 ENCOUNTER — Other Ambulatory Visit: Payer: Self-pay | Admitting: Nurse Practitioner

## 2016-08-14 LAB — CBC
Hematocrit: 35.8 % (ref 34.0–46.6)
Hemoglobin: 11.8 g/dL (ref 11.1–15.9)
MCH: 31.3 pg (ref 26.6–33.0)
MCHC: 33 g/dL (ref 31.5–35.7)
MCV: 95 fL (ref 79–97)
Platelets: 193 10*3/uL (ref 150–379)
RBC: 3.77 x10E6/uL (ref 3.77–5.28)
RDW: 14 % (ref 12.3–15.4)
WBC: 6.1 10*3/uL (ref 3.4–10.8)

## 2016-08-14 LAB — BASIC METABOLIC PANEL
BUN/Creatinine Ratio: 25 (ref 12–28)
BUN: 48 mg/dL — ABNORMAL HIGH (ref 8–27)
CO2: 21 mmol/L (ref 18–29)
Calcium: 9 mg/dL (ref 8.7–10.3)
Chloride: 102 mmol/L (ref 96–106)
Creatinine, Ser: 1.91 mg/dL — ABNORMAL HIGH (ref 0.57–1.00)
GFR calc Af Amer: 29 mL/min/{1.73_m2} — ABNORMAL LOW (ref >59)
GFR calc non Af Amer: 25 mL/min/{1.73_m2} — ABNORMAL LOW (ref >59)
Glucose: 157 mg/dL — ABNORMAL HIGH (ref 65–99)
Potassium: 4 mmol/L (ref 3.5–5.2)
Sodium: 141 mmol/L (ref 134–144)

## 2016-08-14 LAB — PROTIME-INR
INR: 1.1 (ref 0.8–1.2)
Prothrombin Time: 11.5 s (ref 9.1–12.0)

## 2016-08-14 LAB — APTT: aPTT: 25 s (ref 24–33)

## 2016-08-15 ENCOUNTER — Other Ambulatory Visit: Payer: Self-pay | Admitting: Nurse Practitioner

## 2016-08-15 DIAGNOSIS — N184 Chronic kidney disease, stage 4 (severe): Secondary | ICD-10-CM

## 2016-08-15 NOTE — Telephone Encounter (Signed)
Call made to Pt, identity verified by name and dob.  Called to verify cath appointment next Wednesday and answer any questions.  Pt asked if she should take her baby asa day of procedure.  Notified Pt that she should take the ASA.  Discussed medications, Pt indicates understanding to hold lasix, glipizide and levemire prior to cath.  Notified Pt that with her current kidney function she may need to be monitored overnight.  Pt indicates understanding.  Will cont to monitor Pt for any educational needs.

## 2016-08-20 ENCOUNTER — Ambulatory Visit (HOSPITAL_COMMUNITY)
Admission: RE | Admit: 2016-08-20 | Discharge: 2016-08-20 | Disposition: A | Discharge status: Home / Self Care | Payer: Medicare Other | Source: Ambulatory Visit | Attending: Cardiovascular Disease | Admitting: Cardiovascular Disease

## 2016-08-20 ENCOUNTER — Encounter (HOSPITAL_COMMUNITY)
Admission: RE | Disposition: A | Discharge status: Home / Self Care | Payer: Self-pay | Source: Ambulatory Visit | Attending: Cardiovascular Disease

## 2016-08-20 ENCOUNTER — Encounter: Payer: Self-pay | Admitting: Nurse Practitioner

## 2016-08-20 DIAGNOSIS — E1122 Type 2 diabetes mellitus with diabetic chronic kidney disease: Secondary | ICD-10-CM | POA: Diagnosis not present

## 2016-08-20 DIAGNOSIS — N189 Chronic kidney disease, unspecified: Secondary | ICD-10-CM | POA: Diagnosis not present

## 2016-08-20 DIAGNOSIS — E669 Obesity, unspecified: Secondary | ICD-10-CM | POA: Diagnosis not present

## 2016-08-20 DIAGNOSIS — K219 Gastro-esophageal reflux disease without esophagitis: Secondary | ICD-10-CM | POA: Diagnosis not present

## 2016-08-20 DIAGNOSIS — F329 Major depressive disorder, single episode, unspecified: Secondary | ICD-10-CM | POA: Insufficient documentation

## 2016-08-20 DIAGNOSIS — I420 Dilated cardiomyopathy: Secondary | ICD-10-CM

## 2016-08-20 DIAGNOSIS — E785 Hyperlipidemia, unspecified: Secondary | ICD-10-CM | POA: Diagnosis not present

## 2016-08-20 DIAGNOSIS — I2510 Atherosclerotic heart disease of native coronary artery without angina pectoris: Secondary | ICD-10-CM | POA: Diagnosis not present

## 2016-08-20 DIAGNOSIS — I130 Hypertensive heart and chronic kidney disease with heart failure and stage 1 through stage 4 chronic kidney disease, or unspecified chronic kidney disease: Secondary | ICD-10-CM | POA: Diagnosis not present

## 2016-08-20 DIAGNOSIS — E039 Hypothyroidism, unspecified: Secondary | ICD-10-CM | POA: Insufficient documentation

## 2016-08-20 DIAGNOSIS — Z7982 Long term (current) use of aspirin: Secondary | ICD-10-CM | POA: Diagnosis not present

## 2016-08-20 DIAGNOSIS — I2584 Coronary atherosclerosis due to calcified coronary lesion: Secondary | ICD-10-CM | POA: Insufficient documentation

## 2016-08-20 DIAGNOSIS — Z8249 Family history of ischemic heart disease and other diseases of the circulatory system: Secondary | ICD-10-CM | POA: Insufficient documentation

## 2016-08-20 DIAGNOSIS — Z794 Long term (current) use of insulin: Secondary | ICD-10-CM | POA: Insufficient documentation

## 2016-08-20 DIAGNOSIS — I110 Hypertensive heart disease with heart failure: Secondary | ICD-10-CM | POA: Diagnosis present

## 2016-08-20 DIAGNOSIS — Z6834 Body mass index (BMI) 34.0-34.9, adult: Secondary | ICD-10-CM | POA: Insufficient documentation

## 2016-08-20 DIAGNOSIS — I5023 Acute on chronic systolic (congestive) heart failure: Secondary | ICD-10-CM | POA: Diagnosis not present

## 2016-08-20 DIAGNOSIS — I10 Essential (primary) hypertension: Secondary | ICD-10-CM

## 2016-08-20 DIAGNOSIS — I5021 Acute systolic (congestive) heart failure: Secondary | ICD-10-CM

## 2016-08-20 DIAGNOSIS — I255 Ischemic cardiomyopathy: Secondary | ICD-10-CM | POA: Insufficient documentation

## 2016-08-20 DIAGNOSIS — R931 Abnormal findings on diagnostic imaging of heart and coronary circulation: Secondary | ICD-10-CM | POA: Diagnosis present

## 2016-08-20 HISTORY — PX: RIGHT/LEFT HEART CATH AND CORONARY ANGIOGRAPHY: CATH118266

## 2016-08-20 LAB — BASIC METABOLIC PANEL
Anion gap: 9 (ref 5–15)
BUN: 51 mg/dL — ABNORMAL HIGH (ref 6–20)
CO2: 27 mmol/L (ref 22–32)
Calcium: 9.2 mg/dL (ref 8.9–10.3)
Chloride: 104 mmol/L (ref 101–111)
Creatinine, Ser: 1.73 mg/dL — ABNORMAL HIGH (ref 0.44–1.00)
GFR calc Af Amer: 32 mL/min — ABNORMAL LOW (ref >60)
GFR calc non Af Amer: 28 mL/min — ABNORMAL LOW (ref >60)
Glucose, Bld: 101 mg/dL — ABNORMAL HIGH (ref 65–99)
Potassium: 4 mmol/L (ref 3.5–5.1)
Sodium: 140 mmol/L (ref 135–145)

## 2016-08-20 LAB — POCT I-STAT 3, VENOUS BLOOD GAS (G3P V)
Bicarbonate: 23.7 mmol/L (ref 20.0–28.0)
O2 Saturation: 58 %
PCO2 VEN: 35.9 mmHg — AB (ref 44.0–60.0)
PH VEN: 7.428 (ref 7.250–7.430)
PO2 VEN: 29 mmHg — AB (ref 32.0–45.0)
TCO2: 25 mmol/L (ref 0–100)

## 2016-08-20 LAB — GLUCOSE, CAPILLARY
GLUCOSE-CAPILLARY: 107 mg/dL — AB (ref 65–99)
GLUCOSE-CAPILLARY: 78 mg/dL (ref 65–99)

## 2016-08-20 LAB — POCT I-STAT 3, ART BLOOD GAS (G3+)
Acid-Base Excess: 1 mmol/L (ref 0.0–2.0)
Bicarbonate: 25.1 mmol/L (ref 20.0–28.0)
O2 Saturation: 94 %
PCO2 ART: 34.8 mmHg (ref 32.0–48.0)
PH ART: 7.465 — AB (ref 7.350–7.450)
TCO2: 26 mmol/L (ref 0–100)
pO2, Arterial: 67 mmHg — ABNORMAL LOW (ref 83.0–108.0)

## 2016-08-20 SURGERY — RIGHT/LEFT HEART CATH AND CORONARY ANGIOGRAPHY
Anesthesia: LOCAL

## 2016-08-20 MED ORDER — SODIUM CHLORIDE 0.9 % IV SOLN: INTRAVENOUS | Status: AC

## 2016-08-20 MED ORDER — ONDANSETRON HCL 4 MG/2ML IJ SOLN: 4.0000 mg | Freq: Four times a day (QID) | INTRAMUSCULAR | Status: DC | PRN

## 2016-08-20 MED ORDER — DIAZEPAM 5 MG PO TABS
ORAL_TABLET | ORAL | Status: AC
  Administered 2016-08-20: 10 mg via ORAL
  Filled 2016-08-20: qty 2

## 2016-08-20 MED ORDER — SODIUM CHLORIDE 0.9 % IV SOLN
INTRAVENOUS | Status: DC
  Administered 2016-08-20: 14:00:00 via INTRAVENOUS

## 2016-08-20 MED ORDER — MIDAZOLAM HCL 2 MG/2ML IJ SOLN
INTRAMUSCULAR | Status: AC
  Filled 2016-08-20: qty 2

## 2016-08-20 MED ORDER — FENTANYL CITRATE (PF) 100 MCG/2ML IJ SOLN
INTRAMUSCULAR | Status: AC
  Filled 2016-08-20: qty 2

## 2016-08-20 MED ORDER — LABETALOL HCL 5 MG/ML IV SOLN
20.0000 mg | INTRAVENOUS | Status: DC | PRN
  Administered 2016-08-20: 20 mg via INTRAVENOUS

## 2016-08-20 MED ORDER — IOPAMIDOL (ISOVUE-370) INJECTION 76%
INTRAVENOUS | Status: AC
  Filled 2016-08-20: qty 100

## 2016-08-20 MED ORDER — LIDOCAINE HCL (PF) 1 % IJ SOLN
INTRAMUSCULAR | Status: DC | PRN
  Administered 2016-08-20: 30 mL

## 2016-08-20 MED ORDER — FENTANYL CITRATE (PF) 100 MCG/2ML IJ SOLN
INTRAMUSCULAR | Status: DC | PRN
  Administered 2016-08-20: 25 ug via INTRAVENOUS

## 2016-08-20 MED ORDER — MIDAZOLAM HCL 2 MG/2ML IJ SOLN
INTRAMUSCULAR | Status: DC | PRN
  Administered 2016-08-20: 1 mg via INTRAVENOUS

## 2016-08-20 MED ORDER — HEPARIN (PORCINE) IN NACL 2-0.9 UNIT/ML-% IJ SOLN
INTRAMUSCULAR | Status: AC | PRN
  Administered 2016-08-20: 1000 mL

## 2016-08-20 MED ORDER — DIAZEPAM 5 MG PO TABS
10.0000 mg | ORAL_TABLET | ORAL | Status: AC
  Administered 2016-08-20: 10 mg via ORAL

## 2016-08-20 MED ORDER — SODIUM CHLORIDE 0.9 % IV SOLN: 250.0000 mL | INTRAVENOUS | Status: DC | PRN

## 2016-08-20 MED ORDER — SODIUM CHLORIDE 0.9% FLUSH: 3.0000 mL | Freq: Two times a day (BID) | INTRAVENOUS | Status: DC

## 2016-08-20 MED ORDER — ACETAMINOPHEN 325 MG PO TABS: 650.0000 mg | ORAL_TABLET | ORAL | Status: DC | PRN

## 2016-08-20 MED ORDER — LABETALOL HCL 5 MG/ML IV SOLN
INTRAVENOUS | Status: AC
  Filled 2016-08-20: qty 4

## 2016-08-20 MED ORDER — HEPARIN (PORCINE) IN NACL 2-0.9 UNIT/ML-% IJ SOLN
INTRAMUSCULAR | Status: AC
  Filled 2016-08-20: qty 1000

## 2016-08-20 MED ORDER — ASPIRIN 81 MG PO CHEW: 81.0000 mg | CHEWABLE_TABLET | ORAL | Status: DC

## 2016-08-20 MED ORDER — IOPAMIDOL (ISOVUE-370) INJECTION 76%
INTRAVENOUS | Status: DC | PRN
  Administered 2016-08-20: 40 mL via INTRA_ARTERIAL

## 2016-08-20 MED ORDER — HYDRALAZINE HCL 20 MG/ML IJ SOLN
INTRAMUSCULAR | Status: AC
  Filled 2016-08-20: qty 1

## 2016-08-20 MED ORDER — SODIUM CHLORIDE 0.9% FLUSH: 3.0000 mL | INTRAVENOUS | Status: DC | PRN

## 2016-08-20 MED ORDER — LIDOCAINE HCL (PF) 1 % IJ SOLN
INTRAMUSCULAR | Status: AC
  Filled 2016-08-20: qty 30

## 2016-08-20 MED ORDER — HYDRALAZINE HCL 20 MG/ML IJ SOLN
10.0000 mg | Freq: Four times a day (QID) | INTRAMUSCULAR | Status: DC | PRN
Start: 2016-08-20 — End: 2016-08-21
  Administered 2016-08-20: 10 mg via INTRAVENOUS

## 2016-08-20 SURGICAL SUPPLY — 13 items
CATH INFINITI 5FR MULTPACK ANG (CATHETERS) ×1 IMPLANT
CATH SWAN GANZ 7F STRAIGHT (CATHETERS) ×1 IMPLANT
COVER PRB 48X5XTLSCP FOLD TPE (BAG) IMPLANT
COVER PROBE 5X48 (BAG) ×2
GUIDEWIRE 3MM J TIP .035 145 (WIRE) ×2 IMPLANT
KIT HEART LEFT (KITS) ×2 IMPLANT
PACK CARDIAC CATHETERIZATION (CUSTOM PROCEDURE TRAY) ×2 IMPLANT
SHEATH GLIDE SLENDER 4/5FR (SHEATH) IMPLANT
SHEATH PINNACLE 5F 10CM (SHEATH) ×1 IMPLANT
SHEATH PINNACLE 7F 10CM (SHEATH) ×1 IMPLANT
TRANSDUCER W/STOPCOCK (MISCELLANEOUS) ×2 IMPLANT
TUBING CIL FLEX 10 FLL-RA (TUBING) ×2 IMPLANT
WIRE EMERALD 3MM-J .025X260CM (WIRE) ×1 IMPLANT

## 2016-08-20 NOTE — Progress Notes (Addendum)
Site area: RFA/RFV Site Prior to Removal:  Level 0 Pressure Applied For:25 min Manual:   yes Patient Status During Pull:  stable Post Pull Site:  Level  Post Pull Instructions Given: yes  Post Pull Pulses Present: palpable rt pt Dressing Applied: tegaderm  Bedrest begins @ 8241 till 2130 Comments:

## 2016-08-20 NOTE — H&P (View-Only) (Signed)
CARDIOLOGY OFFICE NOTE  Date:  08/13/2016    Jillian Willis Date of Birth: 1940-07-31 Medical Record #[id number]  PCP:  Anda Kraft, MD  Cardiologist:  Cyndra Numbers   Chief Complaint  Patient presents with  . Cardiomyopathy    Follow up visit - seen for Dr. Stanford Breed    History of Present Illness: Jillian Willis is a 76 y.o. female who presents today for a follow up visit. Seen for Dr. Stanford Breed. She is a former patient of Dr. Susa Simmonds. She has expressed her desire to follow with me going forward.   She has known CAD with remote angioplasty of the mid LAD back in 2004. Last cath in 2009 showed a 30-40% left main, 60-70% ostial circumflex which was relatively small, 30-40% proximal LAD. Ejection fraction was 50-55% with mild anterior hypokinesis. Nuclear study June 2015 showed a small anteroapical scar with minimal reversibility. Ejection fraction 61%. Treated medically.   Patient seen recently by primary care with complaints of dyspnea. Chest x-ray showed congestive heart failure with small bilateral pleural effusions. Apparently given antibiotics. Echocardiogram showed ejection fraction 63-87%, grade 2 diastolic dysfunction, mild mitral regurgitation and mild left atrial enlargement. She was diuresed. Also placed on antibiotics. Seen initially by Dr. Stanford Breed and then by me - our plan was to try and tune her up and get her set up for cardiac cath - however labs showed worsening kidney function with very high BNP - Myoview then ordered - this was quite abnormal. She was actually feeling better on her last visit with me clinically. Her Lasix has been cut back, ARB stopped and placed on nitrates/hydralazine.   Comes in today. Here with her husband. Says she is feeling better. Her legs feel heavy at times. She remains fatigued. Her breathing has improved but she still has DOE with minimal activities. She is able to do more of her house activities but is not back to what she  was able to do say this past Christmas - she is not getting out and shopping as she was a few months ago. She still has this chest heaviness - when she exerts and when she is short of breath. Has to rest. Her weight is down 2 pounds. She has been referred to a kidney doctor - seeing them in about 2 weeks.   Past Medical History:  Diagnosis Date  . Cancer ([place])    L breast CA  . Depression   . Diabetes mellitus   . GERD (gastroesophageal reflux disease)   . Hyperlipidemia   . Hypertension   . Hypothyroidism   . IHD (ischemic heart disease)    s/p cath in 2009 showing heavily calcified left main with mild to moderate diffuse CAD. Remote angioplasty of the mid LAD in 2004.  Last myoview in 2012 felt to be low risk.   . Obesity     Past Surgical History:  Procedure Laterality Date  . BREAST SURGERY  2000   lympectomy on Left Br  . BREAST SURGERY  2005   Right Br Reduction  . CARDIAC CATHETERIZATION  06/29/2007   EF 50-55%  . CARDIAC CATHETERIZATION  11/14/2002   EF 55-60%  . CARDIAC CATHETERIZATION  05/09/2002   EF 45%  . CARDIOVASCULAR STRESS TEST  05/23/10   EF 68%, NO ISCHEMIA     Medications: Current Outpatient Prescriptions  Medication Sig Dispense Refill  . ACCU-CHEK SMARTVIEW test strip     . aspirin 81 MG tablet Take 81  mg by mouth daily.     . bisacodyl (BISACODYL LAXATIVE) 10 MG suppository Place 1 suppository (10 mg total) rectally as needed for moderate constipation. 12 suppository 0  . carvedilol (COREG) 3.125 MG tablet Take 1 tablet (3.125 mg total) by mouth 2 (two) times daily. 180 tablet 3  . Chlorphen-Pyril-Phenyleph (TRIPLEX AD PO) Take 1 tablet by mouth daily.    . Choline Fenofibrate (FENOFIBRIC ACID) 135 MG CPDR Take 135 mcg by mouth daily.    . clarithromycin (BIAXIN) 500 MG tablet Take 1 tablet by mouth 2 (two) times daily. For 10 days  0  . diclofenac sodium (VOLTAREN) 1 % GEL Apply 2 g topically 4 (four) times daily as needed. Apply 2-4 grams to affected  area 4 times a day   1  . docusate sodium (COLACE) 100 MG capsule Take 1 capsule (100 mg total) by mouth every 12 (twelve) hours. 60 capsule 0  . escitalopram (LEXAPRO) 10 MG tablet Take 10 mg by mouth as needed. Depression/anxiety.    . furosemide (LASIX) 40 MG tablet Take 40 mg by mouth daily.    Marland Kitchen glipiZIDE (GLUCOTROL XL) 2.5 MG 24 hr tablet Take 1 tablet by mouth 2 (two) times daily.  0  . hydrALAZINE (APRESOLINE) 25 MG tablet Take 1 tablet (25 mg total) by mouth 3 (three) times daily. 90 tablet 3  . isosorbide mononitrate (IMDUR) 30 MG 24 hr tablet Take 1 tablet (30 mg total) by mouth daily. 30 tablet 6  . lactulose (CHRONULAC) 10 GM/15ML solution Take 15 mLs (10 g total) by mouth 2 (two) times daily. 240 mL 0  . LEVEMIR FLEXTOUCH 100 UNIT/ML Pen 10 units in the morning and 20 units at night    . liothyronine (CYTOMEL) 25 MCG tablet Take 25 mcg by mouth every morning.     . nitrofurantoin (MACRODANTIN) 50 MG capsule Take 1 capsule by mouth as needed. Take 1 capsule, after 5 minutes if no relief take a second capsule and head for the hospital  3  . nitroGLYCERIN (NITROSTAT) 0.4 MG SL tablet Place 1 tablet (0.4 mg total) under the tongue every 5 (five) minutes as needed. 25 tablet 6  . rosuvastatin (CRESTOR) 10 MG tablet Take 10 mg by mouth daily.    Marland Kitchen SYNTHROID 125 MCG tablet Take 1 tablet by mouth daily.  5  . ZETIA 10 MG tablet Take 10 mg by mouth daily.      No current facility-administered medications for this visit.     Allergies: Allergies  Allergen Reactions  . Peanut Oil Anaphylaxis  . Ciprofloxacin Diarrhea  . Codeine     Social History: The patient  reports that she has never smoked. She has never used smokeless tobacco. She reports that she does not drink alcohol or use drugs.   Family History: The patient's family history includes Cancer in her mother; Heart attack in her mother; Heart disease in her father; Stroke in her father.   Review of Systems: Please see the  history of present illness.   Otherwise, the review of systems is positive for none.   All other systems are reviewed and negative.   Physical Exam: VS:  BP 132/62 (BP Location: Right Arm, Patient Position: Sitting, Cuff Size: Normal)   Pulse (!) 55   Ht 5\' 2"  (1.575 m)   Wt 188 lb 12.8 oz (85.6 kg)   SpO2 97% Comment: at rest  BMI 34.53 kg/m  .  BMI Body mass index is 34.53 kg/m.  Wt Readings from Last 3 Encounters:  08/13/16 188 lb 12.8 oz (85.6 kg)  08/05/16 190 lb (86.2 kg)  07/30/16 190 lb 6.4 oz (86.4 kg)    Willis: Pleasant. She is alert and in no acute distress.  Color always looks pale.  HEENT: Normal.  Neck: Supple, no JVD, carotid bruits, or masses noted.  Cardiac: Regular rate and rhythm. No murmurs, rubs, or gallops. No edema.  Respiratory:  Lungs are clear to auscultation bilaterally with normal work of breathing.  GI: Soft and nontender.  MS: No deformity or atrophy. Gait and ROM intact.  Skin: Warm and dry. Color is normal.  Neuro:  Strength and sensation are intact and no gross focal deficits noted.  Psych: Alert, appropriate and with normal affect.   LABORATORY DATA:  EKG:  EKG is not ordered today.  Lab Results  Component Value Date   WBC 8.3 07/28/2016   HGB 13.5 12/13/2015   HCT 38.3 07/28/2016   PLT 237 07/28/2016   GLUCOSE 162 (H) 08/05/2016   CHOL (H) 05/09/2007    256        ATP III CLASSIFICATION:  <200     mg/dL   Desirable  200-239  mg/dL   Borderline High  >=240    mg/dL   High   TRIG 123 05/09/2007   HDL 57 05/09/2007   LDLCALC (H) 05/09/2007    174        Total Cholesterol/HDL:CHD Risk Coronary Heart Disease Risk Table                     Men   Women  1/2 Average Risk   3.4   3.3   ALT 24 07/28/2016   AST 46 (H) 07/28/2016   NA 138 08/05/2016   K 4.0 08/05/2016   CL 99 08/05/2016   CREATININE 1.80 (H) 08/05/2016   BUN 59 (H) 08/05/2016   CO2 22 08/05/2016   TSH 12.330 (H) 07/28/2016   HGBA1C (H) 05/09/2007     12.6 (NOTE)   The ADA recommends the following therapeutic goals for glycemic   control related to Hgb A1C measurement:   Goal of Therapy:   < 7.0% Hgb A1C   Action Suggested:  > 8.0% Hgb A1C   Ref:  Diabetes Care, 22, Suppl. 1, 1999    BNP (last 3 results) No results for input(s): BNP in the last 8760 hours.  ProBNP (last 3 results)  Recent Labs  07/28/16 1135  PROBNP 15,991*     Other Studies Reviewed Today:  Myoview Study Highlights 07/2016    The left ventricular ejection fraction is severely decreased (<30%).  Nuclear stress EF: 19%.  No T wave inversion was noted during stress.  There was no ST segment deviation noted during stress.  Defect 1: There is a large defect of severe severity.  Findings consistent with prior myocardial infarction.  This is a high risk study.   Large size, severe severity fixed anterior, septal, inferior and apical perfusion defect with sparing of the basal anterior and lateral wall, consistent with scar. Moderately dilated ventricle with LVEF 19% and severe global hypokinesis with preserved lateral wall motion. This is a high risk study.    Echo Study Conclusions 07/23/2016  - Left ventricle: The cavity size was mildly dilated. Systolic function was severely reduced. The estimated ejection fraction was in the range of 20% to 25%. Diffuse hypokinesis. Features are consistent with a pseudonormal left ventricular filling pattern, with concomitant abnormal  relaxation and increased filling pressure (grade 2 diastolic dysfunction). Doppler parameters are consistent with high ventricular filling pressure. - Aortic valve: Valve mobility was restricted. - Mitral valve: Calcified annulus. There was mild regurgitation. - Left atrium: The atrium was mildly dilated. - Pericardium, extracardiac: A trivial pericardial effusion was identified.  Impressions:  - Definity used; severe global reduction in LV systolic  function; mild LVE; moderate diastolic dysfunction with elevated filing pressure; mild MR; mild LAE.   Assessment/Plan:  1. Newly diagnosed cardiomyopathy with systolic HF - EF now 20 to 25%. She is now on low dose beta blocker. We have had to stop her ARB and she is now on Imdur/Hydralazine. Her Myoview is quite abnormal. She has been subsequently seen with Dr. Burt Knack here in the office this morning. We suspect that her kidney function has improved due to last med changes - would like to proceed on with left and right cardiac catheterization but this will be a diagnostic study only. She understands that she may need a staged procedure. She understands that limited xray dye will be used. No plans for LV gram. The patient understands that risks include but are not limited to stroke (1 in 1000), death (1 in 1000), kidney failure [usually temporary] (1 in 500), bleeding (1 in 200), allergic reaction [possibly serious] (1 in 200), and agrees to proceed.   2. CAD - her chest pain remains present - Myoview is abnormal - arranging for diagnostic cardiac cath with Dr. Burt Knack.   3. HLD - on statin therapy  4. HTN - BP is currently fine - no changes made today.   5. Hoarseness- this seems resolved - I suspect this was due to her use of the inhaler.   6. Worsening CKD - recheck today. No longer on her ARB and Lasix was cut back. She has been referred to nephrology. Labs today.   Current medicines are reviewed with the patient today.  The patient does not have concerns regarding medicines other than what has been noted above.  The following changes have been made:  See above.  Labs/ tests ordered today include:   No orders of the defined types were placed in this encounter.    Disposition:   FU with me in 2 weeks post cath.   Patient is agreeable to this plan and will call if any problems develop in the interim.   SignedTruitt Merle, NP  08/13/2016 8:45 AM  [facility] [address] [place], [place]  [address] Phone: [phone] Fax: [phone]

## 2016-08-20 NOTE — Interval H&P Note (Signed)
Cath Lab Visit (complete for each Cath Lab visit)  Clinical Evaluation Leading to the Procedure:   ACS: No.  Non-ACS:    Anginal Classification: CCS III  Anti-ischemic medical therapy: Maximal Therapy (2 or more classes of medications)  Non-Invasive Test Results: High-risk stress test findings: cardiac mortality >3%/year  Prior CABG: No previous CABG  History and Physical Interval Note:  08/20/2016 3:42 PM  Jillian Willis  has presented today for surgery, with the diagnosis of ischemic cardiomyopathy  The various methods of treatment have been discussed with the patient and family. After consideration of risks, benefits and other options for treatment, the patient has consented to  Procedure(s): Right/Left Heart Cath and Coronary Angiography (N/A) as a surgical intervention .  The patient's history has been reviewed, patient examined, no change in status, stable for surgery.  I have reviewed the patient's chart and labs.  Questions were answered to the patient's satisfaction.     Sherren Mocha

## 2016-08-20 NOTE — Discharge Instructions (Signed)
Angiogram, Care After °This sheet gives you information about how to care for yourself after your procedure. Your health care provider may also give you more specific instructions. If you have problems or questions, contact your health care provider. °What can I expect after the procedure? °After the procedure, it is common to have bruising and tenderness at the catheter insertion area. °Follow these instructions at home: °Insertion site care  °· Follow instructions from your health care provider about how to take care of your insertion site. Make sure you: °¨ Wash your hands with soap and water before you change your bandage (dressing). If soap and water are not available, use hand sanitizer. °¨ Change your dressing as told by your health care provider. °¨ Leave stitches (sutures), skin glue, or adhesive strips in place. These skin closures may need to stay in place for 2 weeks or longer. If adhesive strip edges start to loosen and curl up, you may trim the loose edges. Do not remove adhesive strips completely unless your health care provider tells you to do that. °· Do not take baths, swim, or use a hot tub until your health care provider approves. °· You may shower 24-48 hours after the procedure or as told by your health care provider. °¨ Gently wash the site with plain soap and water. °¨ Pat the area dry with a clean towel. °¨ Do not rub the site. This may cause bleeding. °· Do not apply powder or lotion to the site. Keep the site clean and dry. °· Check your insertion site every day for signs of infection. Check for: °¨ Redness, swelling, or pain. °¨ Fluid or blood. °¨ Warmth. °¨ Pus or a bad smell. °Activity  °· Rest as told by your health care provider, usually for 1-2 days. °· Do not lift anything that is heavier than 10 lbs. (4.5 kg) or as told by your health care provider. °· Do not drive for 24 hours if you were given a medicine to help you relax (sedative). °· Do not drive or use heavy machinery while  taking prescription pain medicine. °General instructions  °· Return to your normal activities as told by your health care provider, usually in about a week. Ask your health care provider what activities are safe for you. °· If the catheter site starts bleeding, lie flat and put pressure on the site. If the bleeding does not stop, get help right away. This is a medical emergency. °· Drink enough fluid to keep your urine clear or pale yellow. This helps flush the contrast dye from your body. °· Take over-the-counter and prescription medicines only as told by your health care provider. °· Keep all follow-up visits as told by your health care provider. This is important. °Contact a health care provider if: °· You have a fever or chills. °· You have redness, swelling, or pain around your insertion site. °· You have fluid or blood coming from your insertion site. °· The insertion site feels warm to the touch. °· You have pus or a bad smell coming from your insertion site. °· You have bruising around the insertion site. °· You notice blood collecting in the tissue around the catheter site (hematoma). The hematoma may be painful to the touch. °Get help right away if: °· You have severe pain at the catheter insertion area. °· The catheter insertion area swells very fast. °· The catheter insertion area is bleeding, and the bleeding does not stop when you hold steady pressure on   the area. °· The area near or just beyond the catheter insertion site becomes pale, cool, tingly, or numb. °These symptoms may represent a serious problem that is an emergency. Do not wait to see if the symptoms will go away. Get medical help right away. Call your local emergency services (911 in the [place] Do not drive yourself to the hospital. °Summary °· After the procedure, it is common to have bruising and tenderness at the catheter insertion area. °· After the procedure, it is important to rest and drink plenty of fluids. °· Do not take baths,  swim, or use a hot tub until your health care provider says it is okay to do so. You may shower 24-48 hours after the procedure or as told by your health care provider. °· If the catheter site starts bleeding, lie flat and put pressure on the site. If the bleeding does not stop, get help right away. This is a medical emergency. °This information is not intended to replace advice given to you by your health care provider. Make sure you discuss any questions you have with your health care provider. °Document Released: 09/26/2004 Document Revised: 02/13/2016 Document Reviewed: 02/13/2016 °Elsevier Interactive Patient Education © 2017 [employer] ° °

## 2016-08-21 ENCOUNTER — Telehealth: Payer: Self-pay

## 2016-08-21 ENCOUNTER — Encounter (HOSPITAL_COMMUNITY): Payer: Self-pay | Admitting: Cardiovascular Disease

## 2016-08-21 NOTE — Telephone Encounter (Signed)
Patient contacted regarding [facility] s/p catheterization 08/20/2016  Pt understands to follow up 09/20/2016 with Truitt Merle Pt understands discharge instructions?  Yes  Pt understands medication regimen? No change  Spoke with Pt.  Pt states she is doing well.  States groin site is intact, no pain or swelling.  Asked Pt to continue to monitor site and call office if she notices any changes like pain, swelling or other s/s of infection.  Pt indicates understanding.  Pt denies any further needs.

## 2016-08-25 ENCOUNTER — Institutional Professional Consult (permissible substitution) (INDEPENDENT_AMBULATORY_CARE_PROVIDER_SITE_OTHER): Payer: Medicare Other | Admitting: Cardiothoracic Surgery

## 2016-08-25 ENCOUNTER — Other Ambulatory Visit: Payer: Self-pay

## 2016-08-25 ENCOUNTER — Ambulatory Visit
Admission: RE | Admit: 2016-08-25 | Discharge: 2016-08-25 | Disposition: A | Discharge status: Home / Self Care | Payer: Medicare Other | Source: Ambulatory Visit | Attending: Cardiothoracic Surgery | Admitting: Cardiothoracic Surgery

## 2016-08-25 ENCOUNTER — Other Ambulatory Visit: Payer: Self-pay | Admitting: *Deleted

## 2016-08-25 DIAGNOSIS — I255 Ischemic cardiomyopathy: Secondary | ICD-10-CM | POA: Diagnosis not present

## 2016-08-25 DIAGNOSIS — I2510 Atherosclerotic heart disease of native coronary artery without angina pectoris: Secondary | ICD-10-CM

## 2016-08-25 DIAGNOSIS — I2589 Other forms of chronic ischemic heart disease: Secondary | ICD-10-CM

## 2016-08-25 DIAGNOSIS — Z01818 Encounter for other preprocedural examination: Secondary | ICD-10-CM

## 2016-08-25 DIAGNOSIS — R0602 Shortness of breath: Secondary | ICD-10-CM | POA: Diagnosis not present

## 2016-08-25 NOTE — Progress Notes (Signed)
PCP is Anda Kraft, MD Referring Provider is Sherren Mocha, MD  Chief Complaint  Patient presents with  . Coronary Artery Disease    Surgical eval for CABG,CXR today, Cardiac Cath 08/20/16, ECHO 07/23/16   Patient examined, coronary angiogram and 2-D echo cardiogram and 2 view chest x-ray performed today all personally reviewed and counseled with  Patient  HPI: 76 year old obese diabetic hypertensive sedentary female presents with ischemic cardiomyopathy, ejection fraction 20%, with severe two-vessel coronary artery disease. She has dyspnea on exertion, orthopnea, increasing fatigue, increasing abdominal swelling and decreasing exercise tolerance. 2016 echo showed EF of 50%. She underwent stress test which was positive for a large anterior scar. Echocardiogram showed severe anterior apical and septal akinesia, no significant MR or TR. Cardiac cath shows 50% left main, 90% stenosis of a codominant RCA, proximal 90% stenosis of a small LAD, ostial 95% stenosis of a small circumflex and 70% stenosis of a large ramus. LVEDP was 29. Mixed venous saturation 58% with calculated cardiac index of 2.3. PA pressures 52/25. CVP 14 She is in a sinus rhythm.  Other than her ramus intermediate her vessels are suboptimal and small and a diabetic pattern of disease. The patient has significant varicosities of both legs which are visible low the knee. Conduit is definitely a potential problem for CABG  The patient has chronic kidney disease with baseline creatinine 1.8 and proteinuria on her urinalysis for the past 2-3 years.  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 This was performed in 2000 and she has had no evidence recurrence and is no longer followed by oncology.  Overall the patient appears to be fragile and deconditioned. She is able to walk 200 feet in the office with room air saturation maintained on  room air 96-98 percent. Past Medical History:  Diagnosis Date  . Cancer ([place])    L breast CA  . Depression   . Diabetes mellitus   . GERD (gastroesophageal reflux disease)   . Hyperlipidemia   . Hypertension   . Hypothyroidism   . IHD (ischemic heart disease)    s/p cath in 2009 showing heavily calcified left main with mild to moderate diffuse CAD. Remote angioplasty of the mid LAD in 2004.  Last myoview in 2012 felt to be low risk.   . Obesity     Past Surgical History:  Procedure Laterality Date  . BREAST SURGERY  2000   lympectomy on Left Br  . BREAST SURGERY  2005   Right Br Reduction  . CARDIAC CATHETERIZATION  06/29/2007   EF 50-55%  . CARDIAC CATHETERIZATION  11/14/2002   EF 55-60%  . CARDIAC CATHETERIZATION  05/09/2002   EF 45%  . CARDIOVASCULAR STRESS TEST  05/23/10   EF 68%, NO ISCHEMIA  . RIGHT/LEFT HEART CATH AND CORONARY ANGIOGRAPHY N/A 08/20/2016   Procedure: Right/Left Heart Cath and Coronary Angiography;  Surgeon: Sherren Mocha, MD;  Location: [facility];  Service: Cardiovascular;  Laterality: N/A;    Family History  Problem Relation Age of Onset  . Heart attack Mother   . Cancer Mother        Breast  . Heart disease Father   . Stroke Father     Social History Social History  Substance Use Topics  . Smoking status: Never Smoker  . Smokeless tobacco: Never Used  . Alcohol use No    Current Outpatient Prescriptions  Medication Sig Dispense  Refill  . ACCU-CHEK SMARTVIEW test strip     . aspirin 81 MG tablet Take 81 mg by mouth daily.     . bisacodyl (BISACODYL LAXATIVE) 10 MG suppository Place 1 suppository (10 mg total) rectally as needed for moderate constipation. 12 suppository 0  . carvedilol (COREG) 3.125 MG tablet Take 1 tablet (3.125 mg total) by mouth 2 (two) times daily. 180 tablet 3  . Chlorphen-Pyril-Phenyleph (TRIPLEX AD PO) Take 1 tablet by mouth daily.    . Choline Fenofibrate (FENOFIBRIC ACID) 135 MG CPDR Take 135 mcg by mouth  daily.    . diclofenac sodium (VOLTAREN) 1 % GEL Apply 2 g topically 4 (four) times daily as needed. Apply 2-4 grams to affected area 4 times a day   1  . docusate sodium (COLACE) 100 MG capsule Take 1 capsule (100 mg total) by mouth every 12 (twelve) hours. 60 capsule 0  . escitalopram (LEXAPRO) 10 MG tablet Take 10 mg by mouth as needed. Depression/anxiety.    . furosemide (LASIX) 40 MG tablet Take 40 mg by mouth daily.    Marland Kitchen glipiZIDE (GLUCOTROL XL) 2.5 MG 24 hr tablet Take 1 tablet by mouth 2 (two) times daily.  0  . hydrALAZINE (APRESOLINE) 25 MG tablet Take 1 tablet (25 mg total) by mouth 3 (three) times daily. 90 tablet 3  . isosorbide mononitrate (IMDUR) 30 MG 24 hr tablet Take 1 tablet (30 mg total) by mouth daily. 30 tablet 6  . lactulose (CHRONULAC) 10 GM/15ML solution Take 15 mLs (10 g total) by mouth 2 (two) times daily. 240 mL 0  . LEVEMIR FLEXTOUCH 100 UNIT/ML Pen 10 units in the morning and 20 units at night    . liothyronine (CYTOMEL) 25 MCG tablet Take 25 mcg by mouth every morning.     . nitroGLYCERIN (NITROSTAT) 0.4 MG SL tablet Place 1 tablet (0.4 mg total) under the tongue every 5 (five) minutes as needed. 25 tablet 6  . rosuvastatin (CRESTOR) 10 MG tablet Take 10 mg by mouth daily.    Marland Kitchen SYNTHROID 125 MCG tablet Take 1 tablet by mouth daily.  5  . ZETIA 10 MG tablet Take 10 mg by mouth daily.      No current facility-administered medications for this visit.     Allergies  Allergen Reactions  . Peanut Oil Anaphylaxis  . Ciprofloxacin Diarrhea  . Codeine     Review of Systems      Last recorded hemoglobin A1c is 12.1 She states earlier this year hemoglobin A1c was 8.0 The patient is right-hand dominant The patient has had no problems general anesthesia with her previous breast resection and cholecystectomy and left knee surgery She is allergic to Cipro She is a retired [employer] scientist    Review of Systems :  [ y ] = yes, [  ] = no        General :  Weight gain [  yes ]     Weight loss  [   ]  Fatigue [  ]  Fever [  ]  Chills  [  ]                                Weakness  [place].Blacker  ]           HEENT    Headache [  ]  Dizziness [  ]  Blurred vision [  ] Glaucoma  [  ]  Nosebleeds [  ] Painful or loose teeth [  ]        Cardiac :  Chest pain/ pressure [  ]  Resting SOB [  ] exertional SOB [place].Blacker  ]                        Orthopnea [place].Blacker ]  Pedal edema  [mild  ]  Palpitations [  ] Syncope/presyncope [place].Blacker ]                        Paroxysmal nocturnal dyspnea [  ]         Pulmonary : cough [  ]  wheezing [  ]  Hemoptysis [  ] Sputum [  ] Snoring [  ]                              Pneumothorax [  ]  Sleep apnea [  ]        GI : Vomiting [  ]  Dysphagia [  ]  Melena  [  ]  Abdominal pain [yes problems with constipation  ] BRBPR [  ]              Heart burn [  ]  Constipation [  ] Diarrhea  [  ] Colonoscopy [ yes last year  ]        GU : Hematuria [  ]  Dysuria [  ]  Nocturia [ yes ] UTI's [  ]        Vascular : Claudication [  ]  Rest pain [  ]  DVT [  ] Vein stripping [  ] leg ulcers [  ]                          TIA [  ] Stroke [  ]  Varicose veins [ yes bilaterally below the knee ]        NEURO :  Headaches  [  ] Seizures [  ] Vision changes [  ] Paresthesias [  ]                                       Seizures [  ]        Musculoskeletal :  Arthritis [  ] Gout  [  ]  Back pain [  ]  Joint pain [  ]        Skin :  Rash [  ]  Melanoma [  ] Sores [  ]        Heme : Bleeding problems [  ]Clotting Disorders [  ] Anemia [  ]Blood Transfusion [ ]         Endocrine : Diabetes [yes on Lantus insulin twice a day  ] Heat or Cold intolerance [  ] Polyuria [  ]excessive thirst [ ]         Psych : Depression [yes on Lexapro]  Anxiety [  ]  Psych hospitalizations [  ] Memory change [  ]         No history of thoracic trauma or rib fractures  No history of bleeding problems or blood transfusion  BP (!) 103/56    Pulse 60   Resp 20   Ht 5\' 2"  (1.575 m)   Wt 189 lb (85.7 kg)   SpO2 99% Comment: RA  BMI 34.57 kg/m  Physical Exam      Physical Exam  General: Chronically ill appearing obese Caucasian female HEENT: Normocephalic pupils equal , dentition [place] dental plates Neck: Supple without JVD, adenopathy, or bruit Chest: Diminished breath sounds right base correlates with today's chest x-ray showing new right pleural effusion since a month ago, no rhonchi, no tenderness             or deformity Cardiovascular: Regular rate and rhythm, no murmur, no gallop, peripheral pulses 1-2+  Abdomen:  Soft, obese, nontender, no palpable mass or organomegaly Extremities: Warm, well-perfused, no clubbing cyanosis, mild edema but without  tenderness,              Large varicosities of each leg below the knee without venous stasis ulcers Rectal/GU: Deferred Neuro: Grossly non--focal and symmetrical throughout Skin: Clean and dry without rash or ulceration   Diagnostic Tests: Chest x-ray shows cardiomegaly with new right pleural effusion Echo shows severe LV dysfunction EF 20% large anterior apical septal area of akinesia Coronary angiograms show severe proximal LAD circumflex stenosis with small diabetic target vessels Right heart cath shows elevated filling pressures with maintained cardiac index Impression: Ischemic cardiomyopathy Suboptimal targets for grafting Chronic kidney disease Potential problems with conduit for bypass grafting Patient appears to be a marginal candidate for CABG but could be potential candidate for high risk surgery which could include a need for temporary postoperative mechanical support-LVAD  Plan: Before proceeding with further discussion regarding surgery she will need a cardiac MRI viability study as well as vein mapping of both legs to assess conduit. She will return after these 2 tests to further discuss her options.   Len Childs, MD [facility] [phone]

## 2016-08-26 ENCOUNTER — Telehealth: Payer: Self-pay | Admitting: Cardiothoracic Surgery

## 2016-08-26 NOTE — Telephone Encounter (Signed)
Called patient after she left a VM for me to call her regarding appointment for cardiac MRI.

## 2016-08-27 ENCOUNTER — Encounter (HOSPITAL_COMMUNITY): Payer: Medicare Other

## 2016-08-27 ENCOUNTER — Ambulatory Visit (HOSPITAL_COMMUNITY)
Admission: RE | Admit: 2016-08-27 | Discharge: 2016-08-27 | Disposition: A | Discharge status: Home / Self Care | Payer: Medicare Other | Source: Ambulatory Visit | Attending: Cardiothoracic Surgery | Admitting: Cardiothoracic Surgery

## 2016-08-27 DIAGNOSIS — Z01818 Encounter for other preprocedural examination: Secondary | ICD-10-CM

## 2016-08-28 DIAGNOSIS — I5020 Unspecified systolic (congestive) heart failure: Secondary | ICD-10-CM | POA: Diagnosis not present

## 2016-08-28 DIAGNOSIS — D649 Anemia, unspecified: Secondary | ICD-10-CM | POA: Diagnosis not present

## 2016-08-28 DIAGNOSIS — N189 Chronic kidney disease, unspecified: Secondary | ICD-10-CM | POA: Diagnosis not present

## 2016-08-28 DIAGNOSIS — N184 Chronic kidney disease, stage 4 (severe): Secondary | ICD-10-CM | POA: Diagnosis not present

## 2016-08-28 DIAGNOSIS — N2581 Secondary hyperparathyroidism of renal origin: Secondary | ICD-10-CM | POA: Diagnosis not present

## 2016-08-28 DIAGNOSIS — I2510 Atherosclerotic heart disease of native coronary artery without angina pectoris: Secondary | ICD-10-CM | POA: Diagnosis not present

## 2016-08-28 DIAGNOSIS — E039 Hypothyroidism, unspecified: Secondary | ICD-10-CM | POA: Diagnosis not present

## 2016-08-28 DIAGNOSIS — E669 Obesity, unspecified: Secondary | ICD-10-CM | POA: Diagnosis not present

## 2016-08-29 ENCOUNTER — Other Ambulatory Visit: Payer: Self-pay | Admitting: Nephrology

## 2016-08-29 DIAGNOSIS — N184 Chronic kidney disease, stage 4 (severe): Secondary | ICD-10-CM

## 2016-09-01 ENCOUNTER — Ambulatory Visit
Admission: RE | Admit: 2016-09-01 | Discharge: 2016-09-01 | Disposition: A | Discharge status: Home / Self Care | Payer: Medicare Other | Source: Ambulatory Visit | Attending: Nephrology | Admitting: Nephrology

## 2016-09-01 DIAGNOSIS — N184 Chronic kidney disease, stage 4 (severe): Secondary | ICD-10-CM

## 2016-09-01 DIAGNOSIS — N189 Chronic kidney disease, unspecified: Secondary | ICD-10-CM | POA: Diagnosis not present

## 2016-09-02 ENCOUNTER — Encounter (INDEPENDENT_AMBULATORY_CARE_PROVIDER_SITE_OTHER): Payer: Self-pay

## 2016-09-02 ENCOUNTER — Other Ambulatory Visit: Payer: Self-pay | Admitting: *Deleted

## 2016-09-02 ENCOUNTER — Observation Stay (HOSPITAL_COMMUNITY): Payer: Medicare Other

## 2016-09-02 ENCOUNTER — Encounter: Payer: Self-pay | Admitting: Nurse Practitioner

## 2016-09-02 ENCOUNTER — Encounter (HOSPITAL_COMMUNITY): Payer: Self-pay | Admitting: General Practice

## 2016-09-02 ENCOUNTER — Ambulatory Visit (INDEPENDENT_AMBULATORY_CARE_PROVIDER_SITE_OTHER): Payer: Medicare Other | Admitting: Nurse Practitioner

## 2016-09-02 ENCOUNTER — Inpatient Hospital Stay (HOSPITAL_COMMUNITY)
Admission: AD | Admit: 2016-09-02 | Discharge: 2016-09-21 | DRG: 235 | Disposition: E | Payer: Medicare Other | Source: Ambulatory Visit | Attending: Cardiothoracic Surgery | Admitting: Cardiothoracic Surgery

## 2016-09-02 VITALS — BP 90/60 | HR 55 | Ht 62.0 in | Wt 198.8 lb

## 2016-09-02 DIAGNOSIS — K219 Gastro-esophageal reflux disease without esophagitis: Secondary | ICD-10-CM | POA: Diagnosis present

## 2016-09-02 DIAGNOSIS — I2510 Atherosclerotic heart disease of native coronary artery without angina pectoris: Secondary | ICD-10-CM | POA: Diagnosis not present

## 2016-09-02 DIAGNOSIS — I5021 Acute systolic (congestive) heart failure: Secondary | ICD-10-CM

## 2016-09-02 DIAGNOSIS — I5023 Acute on chronic systolic (congestive) heart failure: Secondary | ICD-10-CM | POA: Diagnosis present

## 2016-09-02 DIAGNOSIS — I130 Hypertensive heart and chronic kidney disease with heart failure and stage 1 through stage 4 chronic kidney disease, or unspecified chronic kidney disease: Secondary | ICD-10-CM | POA: Diagnosis present

## 2016-09-02 DIAGNOSIS — I25110 Atherosclerotic heart disease of native coronary artery with unstable angina pectoris: Principal | ICD-10-CM | POA: Diagnosis present

## 2016-09-02 DIAGNOSIS — E872 Acidosis: Secondary | ICD-10-CM | POA: Diagnosis not present

## 2016-09-02 DIAGNOSIS — E785 Hyperlipidemia, unspecified: Secondary | ICD-10-CM | POA: Diagnosis present

## 2016-09-02 DIAGNOSIS — G4733 Obstructive sleep apnea (adult) (pediatric): Secondary | ICD-10-CM | POA: Diagnosis present

## 2016-09-02 DIAGNOSIS — I25111 Atherosclerotic heart disease of native coronary artery with angina pectoris with documented spasm: Secondary | ICD-10-CM | POA: Diagnosis not present

## 2016-09-02 DIAGNOSIS — I10 Essential (primary) hypertension: Secondary | ICD-10-CM | POA: Diagnosis not present

## 2016-09-02 DIAGNOSIS — Z8249 Family history of ischemic heart disease and other diseases of the circulatory system: Secondary | ICD-10-CM

## 2016-09-02 DIAGNOSIS — N179 Acute kidney failure, unspecified: Secondary | ICD-10-CM | POA: Diagnosis present

## 2016-09-02 DIAGNOSIS — I509 Heart failure, unspecified: Secondary | ICD-10-CM | POA: Diagnosis not present

## 2016-09-02 DIAGNOSIS — Z9889 Other specified postprocedural states: Secondary | ICD-10-CM

## 2016-09-02 DIAGNOSIS — I255 Ischemic cardiomyopathy: Secondary | ICD-10-CM | POA: Diagnosis present

## 2016-09-02 DIAGNOSIS — I420 Dilated cardiomyopathy: Secondary | ICD-10-CM

## 2016-09-02 DIAGNOSIS — Z91010 Allergy to peanuts: Secondary | ICD-10-CM

## 2016-09-02 DIAGNOSIS — J9690 Respiratory failure, unspecified, unspecified whether with hypoxia or hypercapnia: Secondary | ICD-10-CM

## 2016-09-02 DIAGNOSIS — Z794 Long term (current) use of insulin: Secondary | ICD-10-CM

## 2016-09-02 DIAGNOSIS — R570 Cardiogenic shock: Secondary | ICD-10-CM | POA: Diagnosis not present

## 2016-09-02 DIAGNOSIS — D62 Acute posthemorrhagic anemia: Secondary | ICD-10-CM | POA: Diagnosis not present

## 2016-09-02 DIAGNOSIS — J9601 Acute respiratory failure with hypoxia: Secondary | ICD-10-CM | POA: Diagnosis not present

## 2016-09-02 DIAGNOSIS — I8390 Asymptomatic varicose veins of unspecified lower extremity: Secondary | ICD-10-CM | POA: Diagnosis present

## 2016-09-02 DIAGNOSIS — N184 Chronic kidney disease, stage 4 (severe): Secondary | ICD-10-CM | POA: Diagnosis not present

## 2016-09-02 DIAGNOSIS — I25118 Atherosclerotic heart disease of native coronary artery with other forms of angina pectoris: Secondary | ICD-10-CM | POA: Diagnosis present

## 2016-09-02 DIAGNOSIS — I440 Atrioventricular block, first degree: Secondary | ICD-10-CM | POA: Diagnosis not present

## 2016-09-02 DIAGNOSIS — Z885 Allergy status to narcotic agent status: Secondary | ICD-10-CM

## 2016-09-02 DIAGNOSIS — K55059 Acute (reversible) ischemia of intestine, part and extent unspecified: Secondary | ICD-10-CM | POA: Diagnosis not present

## 2016-09-02 DIAGNOSIS — Z515 Encounter for palliative care: Secondary | ICD-10-CM | POA: Diagnosis not present

## 2016-09-02 DIAGNOSIS — R0602 Shortness of breath: Secondary | ICD-10-CM

## 2016-09-02 DIAGNOSIS — F17210 Nicotine dependence, cigarettes, uncomplicated: Secondary | ICD-10-CM | POA: Diagnosis present

## 2016-09-02 DIAGNOSIS — Z6839 Body mass index (BMI) 39.0-39.9, adult: Secondary | ICD-10-CM

## 2016-09-02 DIAGNOSIS — F329 Major depressive disorder, single episode, unspecified: Secondary | ICD-10-CM | POA: Diagnosis present

## 2016-09-02 DIAGNOSIS — D72829 Elevated white blood cell count, unspecified: Secondary | ICD-10-CM | POA: Diagnosis not present

## 2016-09-02 DIAGNOSIS — Z789 Other specified health status: Secondary | ICD-10-CM

## 2016-09-02 DIAGNOSIS — Z881 Allergy status to other antibiotic agents status: Secondary | ICD-10-CM

## 2016-09-02 DIAGNOSIS — D696 Thrombocytopenia, unspecified: Secondary | ICD-10-CM | POA: Diagnosis present

## 2016-09-02 DIAGNOSIS — I452 Bifascicular block: Secondary | ICD-10-CM | POA: Diagnosis present

## 2016-09-02 DIAGNOSIS — Z823 Family history of stroke: Secondary | ICD-10-CM

## 2016-09-02 DIAGNOSIS — I340 Nonrheumatic mitral (valve) insufficiency: Secondary | ICD-10-CM | POA: Diagnosis present

## 2016-09-02 DIAGNOSIS — Z7982 Long term (current) use of aspirin: Secondary | ICD-10-CM

## 2016-09-02 DIAGNOSIS — R34 Anuria and oliguria: Secondary | ICD-10-CM | POA: Diagnosis not present

## 2016-09-02 DIAGNOSIS — E039 Hypothyroidism, unspecified: Secondary | ICD-10-CM | POA: Diagnosis present

## 2016-09-02 DIAGNOSIS — E1122 Type 2 diabetes mellitus with diabetic chronic kidney disease: Secondary | ICD-10-CM | POA: Diagnosis present

## 2016-09-02 DIAGNOSIS — I4891 Unspecified atrial fibrillation: Secondary | ICD-10-CM | POA: Diagnosis not present

## 2016-09-02 DIAGNOSIS — E1165 Type 2 diabetes mellitus with hyperglycemia: Secondary | ICD-10-CM | POA: Diagnosis not present

## 2016-09-02 DIAGNOSIS — Z01810 Encounter for preprocedural cardiovascular examination: Secondary | ICD-10-CM

## 2016-09-02 DIAGNOSIS — Z923 Personal history of irradiation: Secondary | ICD-10-CM

## 2016-09-02 DIAGNOSIS — Z951 Presence of aortocoronary bypass graft: Secondary | ICD-10-CM

## 2016-09-02 DIAGNOSIS — I5043 Acute on chronic combined systolic (congestive) and diastolic (congestive) heart failure: Secondary | ICD-10-CM

## 2016-09-02 DIAGNOSIS — I252 Old myocardial infarction: Secondary | ICD-10-CM

## 2016-09-02 DIAGNOSIS — Z4659 Encounter for fitting and adjustment of other gastrointestinal appliance and device: Secondary | ICD-10-CM

## 2016-09-02 DIAGNOSIS — Z853 Personal history of malignant neoplasm of breast: Secondary | ICD-10-CM

## 2016-09-02 DIAGNOSIS — Z9221 Personal history of antineoplastic chemotherapy: Secondary | ICD-10-CM

## 2016-09-02 HISTORY — DX: Unspecified osteoarthritis, unspecified site: M19.90

## 2016-09-02 HISTORY — DX: Heart failure, unspecified: I50.9

## 2016-09-02 HISTORY — DX: Pneumonia, unspecified organism: J18.9

## 2016-09-02 HISTORY — DX: Obstructive sleep apnea (adult) (pediatric): G47.33

## 2016-09-02 HISTORY — DX: Dependence on other enabling machines and devices: Z99.89

## 2016-09-02 HISTORY — DX: Malignant neoplasm of unspecified site of unspecified female breast: C50.919

## 2016-09-02 HISTORY — DX: Acute myocardial infarction, unspecified: I21.9

## 2016-09-02 HISTORY — DX: Personal history of other diseases of the digestive system: Z87.19

## 2016-09-02 HISTORY — DX: Type 2 diabetes mellitus without complications: E11.9

## 2016-09-02 HISTORY — DX: Other seasonal allergic rhinitis: J30.2

## 2016-09-02 HISTORY — DX: Chronic kidney disease, stage 2 (mild): N18.2

## 2016-09-02 HISTORY — DX: Migraine, unspecified, not intractable, without status migrainosus: G43.909

## 2016-09-02 LAB — PROTIME-INR
INR: 1.19
Prothrombin Time: 15.1 seconds (ref 11.4–15.2)

## 2016-09-02 LAB — SPIROMETRY WITH GRAPH
FEF 25-75 Post: 1.48 L/sec
FEF 25-75 Pre: 1.27 L/sec
FEF2575-%Change-Post: 16 %
FEF2575-%Pred-Post: 93 %
FEF2575-%Pred-Pre: 80 %
FEV1-%Change-Post: 3 %
FEV1-%Pred-Post: 35 %
FEV1-%Pred-Pre: 34 %
FEV1-Post: 0.69 L
FEV1-Pre: 0.66 L
FEV1FVC-%Change-Post: 0 %
FEV1FVC-%Pred-Pre: 127 %
FEV6-%Change-Post: 2 %
FEV6-%Pred-Post: 28 %
FEV6-%Pred-Pre: 27 %
FEV6-Post: 0.71 L
FEV6-Pre: 0.69 L
FEV6FVC-%Pred-Post: 105 %
FEV6FVC-%Pred-Pre: 105 %
FVC-%Change-Post: 3 %
FVC-%Pred-Post: 27 %
FVC-%Pred-Pre: 26 %
FVC-Post: 0.71 L
FVC-Pre: 0.69 L
Post FEV1/FVC ratio: 96 %
Post FEV6/FVC ratio: 100 %
Pre FEV1/FVC ratio: 96 %
Pre FEV6/FVC Ratio: 100 %

## 2016-09-02 LAB — BLOOD GAS, ARTERIAL
ACID-BASE EXCESS: 0.7 mmol/L (ref 0.0–2.0)
Bicarbonate: 25.3 mmol/L (ref 20.0–28.0)
DRAWN BY: 345601
FIO2: 21
O2 SAT: 93.3 %
PATIENT TEMPERATURE: 98.6
pCO2 arterial: 44 mmHg (ref 32.0–48.0)
pH, Arterial: 7.377 (ref 7.350–7.450)
pO2, Arterial: 71.4 mmHg — ABNORMAL LOW (ref 83.0–108.0)

## 2016-09-02 LAB — URINALYSIS, COMPLETE (UACMP) WITH MICROSCOPIC
Bilirubin Urine: NEGATIVE
Glucose, UA: 50 mg/dL — AB
Hgb urine dipstick: NEGATIVE
Ketones, ur: NEGATIVE mg/dL
Leukocytes, UA: NEGATIVE
Nitrite: NEGATIVE
Protein, ur: NEGATIVE mg/dL
Specific Gravity, Urine: 1.01 (ref 1.005–1.030)
pH: 5 (ref 5.0–8.0)

## 2016-09-02 LAB — CBC WITH DIFFERENTIAL/PLATELET
Basophils Absolute: 0 10*3/uL (ref 0.0–0.1)
Basophils Relative: 0 %
Eosinophils Absolute: 0 10*3/uL (ref 0.0–0.7)
Eosinophils Relative: 1 %
HCT: 36.1 % (ref 36.0–46.0)
Hemoglobin: 11.3 g/dL — ABNORMAL LOW (ref 12.0–15.0)
Lymphocytes Relative: 15 %
Lymphs Abs: 0.9 10*3/uL (ref 0.7–4.0)
MCH: 30.5 pg (ref 26.0–34.0)
MCHC: 31.3 g/dL (ref 30.0–36.0)
MCV: 97.3 fL (ref 78.0–100.0)
Monocytes Absolute: 0.5 10*3/uL (ref 0.1–1.0)
Monocytes Relative: 8 %
Neutro Abs: 4.5 10*3/uL (ref 1.7–7.7)
Neutrophils Relative %: 76 %
Platelets: 187 10*3/uL (ref 150–400)
RBC: 3.71 MIL/uL — ABNORMAL LOW (ref 3.87–5.11)
RDW: 14.4 % (ref 11.5–15.5)
WBC: 6 10*3/uL (ref 4.0–10.5)

## 2016-09-02 LAB — COMPREHENSIVE METABOLIC PANEL
ALT: 23 U/L (ref 14–54)
AST: 33 U/L (ref 15–41)
Albumin: 3.4 g/dL — ABNORMAL LOW (ref 3.5–5.0)
Alkaline Phosphatase: 32 U/L — ABNORMAL LOW (ref 38–126)
Anion gap: 13 (ref 5–15)
BUN: 62 mg/dL — ABNORMAL HIGH (ref 6–20)
CO2: 22 mmol/L (ref 22–32)
Calcium: 8.9 mg/dL (ref 8.9–10.3)
Chloride: 102 mmol/L (ref 101–111)
Creatinine, Ser: 2.02 mg/dL — ABNORMAL HIGH (ref 0.44–1.00)
GFR calc Af Amer: 27 mL/min — ABNORMAL LOW (ref >60)
GFR calc non Af Amer: 23 mL/min — ABNORMAL LOW (ref >60)
Glucose, Bld: 276 mg/dL — ABNORMAL HIGH (ref 65–99)
Potassium: 3.8 mmol/L (ref 3.5–5.1)
Sodium: 137 mmol/L (ref 135–145)
Total Bilirubin: 0.5 mg/dL (ref 0.3–1.2)
Total Protein: 7.1 g/dL (ref 6.5–8.1)

## 2016-09-02 LAB — PREPARE RBC (CROSSMATCH)

## 2016-09-02 LAB — GLUCOSE, CAPILLARY
GLUCOSE-CAPILLARY: 299 mg/dL — AB (ref 65–99)
GLUCOSE-CAPILLARY: 265 mg/dL — AB (ref 65–99)
Glucose-Capillary: 270 mg/dL — ABNORMAL HIGH (ref 65–99)
Glucose-Capillary: 152 mg/dL — ABNORMAL HIGH (ref 65–99)

## 2016-09-02 LAB — TSH: TSH: 9.513 u[IU]/mL — ABNORMAL HIGH (ref 0.350–4.500)

## 2016-09-02 LAB — MAGNESIUM: Magnesium: 2.3 mg/dL (ref 1.7–2.4)

## 2016-09-02 LAB — APTT: APTT: 29 s (ref 24–36)

## 2016-09-02 LAB — ABO/RH: ABO/RH(D): AB POS

## 2016-09-02 LAB — BRAIN NATRIURETIC PEPTIDE: B Natriuretic Peptide: 1855.1 pg/mL — ABNORMAL HIGH (ref 0.0–100.0)

## 2016-09-02 LAB — TROPONIN I
Troponin I: 0.22 ng/mL (ref <0.03)
Troponin I: 0.24 ng/mL (ref <0.03)

## 2016-09-02 MED ORDER — NITROGLYCERIN 2 % TD OINT
1.0000 [in_us] | TOPICAL_OINTMENT | Freq: Three times a day (TID) | TRANSDERMAL | Status: DC
  Filled 2016-09-02: qty 30

## 2016-09-02 MED ORDER — PLASMA-LYTE 148 IV SOLN
INTRAVENOUS | Status: AC
  Administered 2016-09-03: 500 mL
  Filled 2016-09-02 (×2): qty 2.5

## 2016-09-02 MED ORDER — SODIUM CHLORIDE 0.9% FLUSH
3.0000 mL | Freq: Two times a day (BID) | INTRAVENOUS | Status: DC
  Administered 2016-09-02: 3 mL via INTRAVENOUS

## 2016-09-02 MED ORDER — METOPROLOL TARTRATE 12.5 MG HALF TABLET
12.5000 mg | ORAL_TABLET | Freq: Once | ORAL | Status: AC
  Administered 2016-09-03: 12.5 mg via ORAL
  Filled 2016-09-02: qty 1

## 2016-09-02 MED ORDER — DOPAMINE-DEXTROSE 3.2-5 MG/ML-% IV SOLN
0.0000 ug/kg/min | INTRAVENOUS | Status: AC
  Administered 2016-09-03: 2 ug/kg/min via INTRAVENOUS
  Filled 2016-09-02 (×2): qty 250

## 2016-09-02 MED ORDER — SODIUM CHLORIDE 0.9 % IV SOLN
INTRAVENOUS | Status: DC
  Filled 2016-09-02 (×2): qty 1

## 2016-09-02 MED ORDER — NITROGLYCERIN IN D5W 200-5 MCG/ML-% IV SOLN
2.0000 ug/min | INTRAVENOUS | Status: AC
  Administered 2016-09-03: 5 ug/min via INTRAVENOUS
  Filled 2016-09-02 (×2): qty 250

## 2016-09-02 MED ORDER — DEXMEDETOMIDINE HCL IN NACL 400 MCG/100ML IV SOLN
0.1000 ug/kg/h | INTRAVENOUS | Status: AC
  Administered 2016-09-03: .2 ug/kg/h via INTRAVENOUS
  Filled 2016-09-02 (×2): qty 100

## 2016-09-02 MED ORDER — CARVEDILOL 3.125 MG PO TABS
3.1250 mg | ORAL_TABLET | Freq: Two times a day (BID) | ORAL | Status: DC
  Administered 2016-09-02: 3.125 mg via ORAL
  Filled 2016-09-02: qty 1

## 2016-09-02 MED ORDER — INSULIN ASPART 100 UNIT/ML ~~LOC~~ SOLN
4.0000 [IU] | Freq: Three times a day (TID) | SUBCUTANEOUS | Status: DC
  Administered 2016-09-02: 4 [IU] via SUBCUTANEOUS

## 2016-09-02 MED ORDER — POTASSIUM CHLORIDE CRYS ER 10 MEQ PO TBCR
10.0000 meq | EXTENDED_RELEASE_TABLET | Freq: Two times a day (BID) | ORAL | Status: DC
  Administered 2016-09-02 (×2): 10 meq via ORAL
  Filled 2016-09-02 (×2): qty 1

## 2016-09-02 MED ORDER — ESCITALOPRAM OXALATE 10 MG PO TABS
10.0000 mg | ORAL_TABLET | Freq: Every day | ORAL | Status: DC
  Administered 2016-09-04 – 2016-09-05 (×2): 10 mg via ORAL
  Filled 2016-09-02 (×2): qty 1

## 2016-09-02 MED ORDER — EPINEPHRINE PF 1 MG/ML IJ SOLN
0.0000 ug/min | INTRAVENOUS | Status: DC
  Filled 2016-09-02 (×2): qty 4

## 2016-09-02 MED ORDER — SODIUM CHLORIDE 0.9 % IV SOLN
30.0000 ug/min | INTRAVENOUS | Status: AC
  Administered 2016-09-03: 50 ug/min via INTRAVENOUS
  Filled 2016-09-02 (×2): qty 2

## 2016-09-02 MED ORDER — SODIUM CHLORIDE 0.9 % IV SOLN
1500.0000 mg | INTRAVENOUS | Status: AC
  Administered 2016-09-03: 1500 mg via INTRAVENOUS
  Filled 2016-09-02 (×2): qty 1500

## 2016-09-02 MED ORDER — TRANEXAMIC ACID 1000 MG/10ML IV SOLN
1.5000 mg/kg/h | INTRAVENOUS | Status: AC
  Administered 2016-09-03: 1.5 mg/kg/h via INTRAVENOUS
  Filled 2016-09-02 (×2): qty 25

## 2016-09-02 MED ORDER — CHLORHEXIDINE GLUCONATE 4 % EX LIQD
60.0000 mL | Freq: Once | CUTANEOUS | Status: AC
  Administered 2016-09-02: 4 via TOPICAL
  Filled 2016-09-02: qty 60

## 2016-09-02 MED ORDER — TEMAZEPAM 15 MG PO CAPS: 15.0000 mg | ORAL_CAPSULE | Freq: Once | ORAL | Status: DC | PRN

## 2016-09-02 MED ORDER — LEVOTHYROXINE SODIUM 100 MCG PO TABS
100.0000 ug | ORAL_TABLET | Freq: Every day | ORAL | Status: DC
  Administered 2016-09-05: 100 ug via ORAL
  Filled 2016-09-02: qty 1

## 2016-09-02 MED ORDER — ROSUVASTATIN CALCIUM 10 MG PO TABS
10.0000 mg | ORAL_TABLET | Freq: Every day | ORAL | Status: DC
  Administered 2016-09-02: 10 mg via ORAL
  Filled 2016-09-02 (×2): qty 1

## 2016-09-02 MED ORDER — DEXTROSE 5 % IV SOLN
750.0000 mg | INTRAVENOUS | Status: DC
  Filled 2016-09-02 (×2): qty 750

## 2016-09-02 MED ORDER — LACTULOSE 10 GM/15ML PO SOLN
10.0000 g | Freq: Two times a day (BID) | ORAL | Status: DC
  Administered 2016-09-02: 10 g via ORAL
  Filled 2016-09-02 (×2): qty 15

## 2016-09-02 MED ORDER — DOCUSATE SODIUM 100 MG PO CAPS
100.0000 mg | ORAL_CAPSULE | Freq: Two times a day (BID) | ORAL | Status: DC
  Administered 2016-09-02: 100 mg via ORAL
  Filled 2016-09-02 (×2): qty 1

## 2016-09-02 MED ORDER — ALPRAZOLAM 0.25 MG PO TABS: 0.2500 mg | ORAL_TABLET | ORAL | Status: DC | PRN

## 2016-09-02 MED ORDER — SODIUM CHLORIDE 0.9% FLUSH: 3.0000 mL | INTRAVENOUS | Status: DC | PRN

## 2016-09-02 MED ORDER — DIAZEPAM 2 MG PO TABS
2.0000 mg | ORAL_TABLET | Freq: Once | ORAL | Status: AC
  Administered 2016-09-03: 2 mg via ORAL
  Filled 2016-09-02: qty 1

## 2016-09-02 MED ORDER — INSULIN DETEMIR 100 UNIT/ML ~~LOC~~ SOLN
10.0000 [IU] | Freq: Every day | SUBCUTANEOUS | Status: DC
  Filled 2016-09-02: qty 0.1

## 2016-09-02 MED ORDER — ACETAMINOPHEN 325 MG PO TABS: 650.0000 mg | ORAL_TABLET | ORAL | Status: DC | PRN

## 2016-09-02 MED ORDER — SODIUM CHLORIDE 0.9 % IV SOLN: 250.0000 mL | INTRAVENOUS | Status: DC | PRN

## 2016-09-02 MED ORDER — INSULIN ASPART 100 UNIT/ML ~~LOC~~ SOLN
0.0000 [IU] | Freq: Three times a day (TID) | SUBCUTANEOUS | Status: DC
  Administered 2016-09-02: 11 [IU] via SUBCUTANEOUS

## 2016-09-02 MED ORDER — LIOTHYRONINE SODIUM 25 MCG PO TABS
25.0000 ug | ORAL_TABLET | Freq: Every day | ORAL | Status: DC
  Administered 2016-09-04 – 2016-09-05 (×2): 25 ug via ORAL
  Filled 2016-09-02 (×5): qty 1

## 2016-09-02 MED ORDER — POTASSIUM CHLORIDE 2 MEQ/ML IV SOLN
80.0000 meq | INTRAVENOUS | Status: DC
Start: 2016-09-03 — End: 2016-09-03
  Filled 2016-09-02: qty 40

## 2016-09-02 MED ORDER — BISACODYL 5 MG PO TBEC: 5.0000 mg | DELAYED_RELEASE_TABLET | Freq: Once | ORAL | Status: DC

## 2016-09-02 MED ORDER — INSULIN ASPART 100 UNIT/ML ~~LOC~~ SOLN: 0.0000 [IU] | Freq: Every day | SUBCUTANEOUS | Status: DC

## 2016-09-02 MED ORDER — SODIUM CHLORIDE 0.9 % IV SOLN
INTRAVENOUS | Status: DC
  Filled 2016-09-02 (×2): qty 30

## 2016-09-02 MED ORDER — ONDANSETRON HCL 4 MG/2ML IJ SOLN
4.0000 mg | Freq: Four times a day (QID) | INTRAMUSCULAR | Status: DC | PRN
  Filled 2016-09-02: qty 2

## 2016-09-02 MED ORDER — TRANEXAMIC ACID (OHS) BOLUS VIA INFUSION
15.0000 mg/kg | INTRAVENOUS | Status: AC
  Administered 2016-09-03: 1353 mg via INTRAVENOUS
  Filled 2016-09-02 (×2): qty 1353

## 2016-09-02 MED ORDER — CHLORHEXIDINE GLUCONATE 0.12 % MT SOLN
15.0000 mL | Freq: Once | OROMUCOSAL | Status: AC
  Administered 2016-09-03: 15 mL via OROMUCOSAL
  Filled 2016-09-02: qty 15

## 2016-09-02 MED ORDER — FUROSEMIDE 10 MG/ML IJ SOLN
60.0000 mg | Freq: Two times a day (BID) | INTRAMUSCULAR | Status: DC
  Administered 2016-09-02 (×2): 60 mg via INTRAVENOUS
  Filled 2016-09-02 (×2): qty 6

## 2016-09-02 MED ORDER — GLIPIZIDE ER 2.5 MG PO TB24
2.5000 mg | ORAL_TABLET | Freq: Two times a day (BID) | ORAL | Status: DC
  Administered 2016-09-02: 2.5 mg via ORAL
  Filled 2016-09-02 (×2): qty 1

## 2016-09-02 MED ORDER — ENOXAPARIN SODIUM 30 MG/0.3ML ~~LOC~~ SOLN
30.0000 mg | SUBCUTANEOUS | Status: DC
  Administered 2016-09-02: 30 mg via SUBCUTANEOUS
  Filled 2016-09-02: qty 0.3

## 2016-09-02 MED ORDER — TRANEXAMIC ACID (OHS) PUMP PRIME SOLUTION
2.0000 mg/kg | INTRAVENOUS | Status: DC
  Filled 2016-09-02 (×2): qty 1.8

## 2016-09-02 MED ORDER — INSULIN DETEMIR 100 UNIT/ML ~~LOC~~ SOLN
20.0000 [IU] | Freq: Every day | SUBCUTANEOUS | Status: DC
  Administered 2016-09-02: 20 [IU] via SUBCUTANEOUS
  Filled 2016-09-02: qty 0.2

## 2016-09-02 MED ORDER — ALBUTEROL SULFATE (2.5 MG/3ML) 0.083% IN NEBU
2.5000 mg | INHALATION_SOLUTION | Freq: Once | RESPIRATORY_TRACT | Status: AC
  Administered 2016-09-02: 2.5 mg via RESPIRATORY_TRACT

## 2016-09-02 MED ORDER — DEXTROSE 5 % IV SOLN
1.5000 g | INTRAVENOUS | Status: AC
  Administered 2016-09-03: 1.5 g via INTRAVENOUS
  Administered 2016-09-03: .75 g via INTRAVENOUS
  Filled 2016-09-02 (×2): qty 1.5

## 2016-09-02 MED ORDER — CHLORHEXIDINE GLUCONATE 4 % EX LIQD
60.0000 mL | Freq: Once | CUTANEOUS | Status: AC
  Administered 2016-09-03: 4 via TOPICAL
  Filled 2016-09-02: qty 60

## 2016-09-02 MED ORDER — MAGNESIUM SULFATE 50 % IJ SOLN
40.0000 meq | INTRAMUSCULAR | Status: DC
  Filled 2016-09-02 (×2): qty 10

## 2016-09-02 MED ORDER — ASPIRIN EC 81 MG PO TBEC: 81.0000 mg | DELAYED_RELEASE_TABLET | Freq: Every day | ORAL | Status: DC

## 2016-09-02 NOTE — Progress Notes (Signed)
Pre-op Cardiac Surgery  Carotid Findings:   Findings are consistent with a 60 - 79 percent stenosis involving the right internal carotid artery.  Findings are consistent with a 20 - 30 percent stenosis involving the left internal carotid artery.  The vertebral arteries demonstrate antegrade flow.  Upper Extremity Right Left  Brachial Pressures 125  Triphasic Triphasic  Radial Waveforms Triphasic Triphasic  Ulnar Waveforms Triphasic Triphasic  Palmar Arch (Allen's Test) Palmar waveforms are decreased greater than fifty percent with radial compression and are obliterated with ulnar compression. Palmar waveforms remain within normal limits with radial compression and are obliterated with ulnar compression.    Lower  Extremity Right Left  Dorsalis Pedis Triphasic Triphasic  Posterior Tibial Triphasic Triphasic  Ankle/Brachial Indices 1.14 1.19   Findings:   Right ABI of 1.14 and left ABI of 1.19 are suggestive of arterial flow within normal limits at rest.  09/04/2016 5:20 PM Jillian Willis RVT

## 2016-09-02 NOTE — Progress Notes (Signed)
Procedure(s) (LRB): CORONARY ARTERY BYPASS GRAFTING (CABG) (N/A) TRANSESOPHAGEAL ECHOCARDIOGRAM (TEE) (N/A) Subjective: Patient examined, chest x-ray performed today images personally reviewed Patient admitted with symptoms of heart failure and ischemia from ischemic cardiomyopathy. She has had both weight gain as well as nausea and dry heaves which probably represent anginal equivalent. Cardiac enzymes are mildly elevated and BNP greater than 1000. Chest x-ray shows mild right pleural effusion no overt pulmonary edema. The patient is comfortable lying supine and saturation room air is 95%.  Since being seen for her initial consultation in office the patient has had vein mapping and it appears that she has adequate saphenous vein in the left leg.  I discussed with the patient her clinical decline. She has suboptimal but graftable targets for her left main three-vessel CAD. Ejection fraction on last echo was 20-25 percent. I recommended high risk CABG tomorrow for the patient. She understands that there are risks of death, stroke, worsening renal failure requiring dialysis, and requirement for mechanical cardiac support postop. She agrees to proceed with high risk CABG in a.m.  Objective: Vital signs in last 24 hours: Temp:  [97.6 F (36.4 C)] 97.6 F (36.4 C) (06/12 1353) Pulse Rate:  [55-58] 58 (06/12 1353) Cardiac Rhythm: Normal sinus rhythm (06/12 1059) Resp:  [19-22] 21 (06/12 1353) BP: (78-116)/(34-64) 104/64 (06/12 1353) SpO2:  [92 %-95 %] 95 % (06/12 1353) Weight:  [198 lb 12.8 oz (90.2 kg)] 198 lb 12.8 oz (90.2 kg) (06/12 0818)  Hemodynamic parameters for last 24 hours:  sinus rhythm afebrile  Intake/Output from previous day: No intake/output data recorded. Intake/Output this shift: Total I/O In: 240 [P.O.:240] Out: 300 [Urine:300]       Exam    General- alert and comfortable   Lungs- clear without rales, wheezes, decreased breath sounds at right base   Cor- regular  rate and rhythm, no murmur , gallop   Abdomen- soft, non-tender   Extremities - warm, non-tender, minimal edema   Neuro- oriented, appropriate, no focal weakness   Lab Results:  Recent Labs  09/05/2016 1035  WBC 6.0  HGB 11.3*  HCT 36.1  PLT 187   BMET:  Recent Labs  08/27/2016 1035  NA 137  K 3.8  CL 102  CO2 22  GLUCOSE 276*  BUN 62*  CREATININE 2.02*  CALCIUM 8.9    PT/INR: No results for input(s): LABPROT, INR in the last 72 hours. ABG    Component Value Date/Time   PHART 7.465 (H) 08/20/2016 1613   HCO3 25.1 08/20/2016 1613   HCO3 23.7 08/20/2016 1613   TCO2 26 08/20/2016 1613   TCO2 25 08/20/2016 1613   ACIDBASEDEF 3.0 (H) 05/08/2007 1737   O2SAT 94.0 08/20/2016 1613   O2SAT 58.0 08/20/2016 1613   CBG (last 3)   Recent Labs  08/27/2016 0955 08/31/2016 1347  GLUCAP 270* 299*    Assessment/Plan: S/P Procedure(s) (LRB): CORONARY ARTERY BYPASS GRAFTING (CABG) (N/A) TRANSESOPHAGEAL ECHOCARDIOGRAM (TEE) (N/A) Plan urgent  high risk surgical coronary revascularization tomorrow Procedure indications benefits alternatives and risks discussed in detail with patient who agrees to proceed.   LOS: 0 days    Tharon Aquas Trigt III 09/12/2016

## 2016-09-02 NOTE — Progress Notes (Signed)
    Pt was directly admitted to 3W. BP initially was 78/34 BP has now increased  To > 421 systolic   She is comfortable lying in bed .   Able to lie supine without dyspnea  Lungs are clear Cor:   RR, 2/6 systolic murmur   Will get labs. Continue diuresis  BP is still too low to start IV NTG. Will continue to assess.   TCTS to see . I suspect she will need to go for CABG sooner than expected.     Mertie Moores, MD  08/24/2016 10:34 AM    [address] [place],  [place], [place]  [id number] Pager [phone] Phone: [phone]; Fax: [phone]

## 2016-09-02 NOTE — Progress Notes (Addendum)
CARDIOLOGY OFFICE NOTE  Date:  09/14/2016    Jillian Willis Date of Birth: May 07, 1940 Medical Record #[id number]  PCP:  Anda Kraft, MD  Cardiologist:  [facility]    Chief Complaint  Patient presents with  . Coronary Artery Disease    Post cath visit - seen for Dr. Kellie Shropshire    History of Present Illness: Jillian Willis is a 76 y.o. female who presents today for a post cath visit.  Seen for Dr. Stanford Breed and Dr. Burt Knack. She is a former patient of Dr. Susa Simmonds. She has expressed her desire to follow with me going forward.   She has known CAD with remote angioplasty of the mid LAD back in 2004. Last cath in 2009 showed a 30-40% left main, 60-70% ostial circumflex which was relatively small, 30-40% proximal LAD. Ejection fraction was 50-55% with mild anterior hypokinesis. Nuclear study June 2015 showed a small anteroapical scar with minimal reversibility. Ejection fraction 61%. Treated medically.   Patient seen recently by primary care with complaints of dyspnea. Chest x-ray showed congestive heart failure with small bilateral pleural effusions. Apparently given antibiotics. Echocardiogram showed ejection fraction 87-68%, grade 2 diastolic dysfunction, mild mitral regurgitation and mild left atrial enlargement. She was diuresed. Also placed on antibiotics. Seen initially by Dr. Stanford Breed and then by me - our plan was to try and tune her up and get her set up for cardiac cath - however labs showed worsening kidney function with very high BNP - Myoview then ordered - this was quite abnormal. She did improve some clinically with medical therapy but her Lasix had to be cut back, ARB stopped and was placed on nitrates/hydralazine. Ended up referring on for cardiac cath at the end of May with Dr. Burt Knack - see below - quite abnormal - has been referred to [facility] and has evaluation underway with plans for cardiac MRI/vein mapping. She has been referred to Nephrology in the  interim.   Comes in today. Here with her husband. She is sick. Feels bad. Dry heaving since 3 AM today. Says she has done ok since the cath until the past few days. Feet have started swelling some. Weight is up 9 pounds. Had renal scan yesterday. To go back to Dr. Hollie Salk in 4 months. More short of breath over the past few days. Legs feel "like bolders". Still with chest pain. Says she "can't do anything" without being short of breath and having chest pain. Has really seemed to back slid. For cardiac MRI and follow up with Dr. Darcey Nora tomorrow. Noted elevated TSH - her dose of Synthroid has not been adjusted recently.   Past Medical History:  Diagnosis Date  . Cancer ([place])    L breast CA  . Depression   . Diabetes mellitus   . GERD (gastroesophageal reflux disease)   . Hyperlipidemia   . Hypertension   . Hypothyroidism   . IHD (ischemic heart disease)    s/p cath in 2009 showing heavily calcified left main with mild to moderate diffuse CAD. Remote angioplasty of the mid LAD in 2004.  Last myoview in 2012 felt to be low risk.   . Obesity     Past Surgical History:  Procedure Laterality Date  . BREAST SURGERY  2000   lympectomy on Left Br  . BREAST SURGERY  2005   Right Br Reduction  . CARDIAC CATHETERIZATION  06/29/2007   EF 50-55%  . CARDIAC CATHETERIZATION  11/14/2002   EF 55-60%  .  CARDIAC CATHETERIZATION  05/09/2002   EF 45%  . CARDIOVASCULAR STRESS TEST  05/23/10   EF 68%, NO ISCHEMIA  . RIGHT/LEFT HEART CATH AND CORONARY ANGIOGRAPHY N/A 08/20/2016   Procedure: Right/Left Heart Cath and Coronary Angiography;  Surgeon: Sherren Mocha, MD;  Location: [facility];  Service: Cardiovascular;  Laterality: N/A;     Medications: Current Outpatient Prescriptions  Medication Sig Dispense Refill  . ACCU-CHEK SMARTVIEW test strip     . aspirin 81 MG tablet Take 81 mg by mouth daily.     . bisacodyl (BISACODYL LAXATIVE) 10 MG suppository Place 1 suppository (10 mg total) rectally  as needed for moderate constipation. 12 suppository 0  . carvedilol (COREG) 3.125 MG tablet Take 1 tablet (3.125 mg total) by mouth 2 (two) times daily. 180 tablet 3  . Chlorphen-Pyril-Phenyleph (TRIPLEX AD PO) Take 1 tablet by mouth daily.    . Choline Fenofibrate (FENOFIBRIC ACID) 135 MG CPDR Take 135 mcg by mouth daily.    . diclofenac sodium (VOLTAREN) 1 % GEL Apply 2 g topically 4 (four) times daily as needed. Apply 2-4 grams to affected area 4 times a day   1  . docusate sodium (COLACE) 100 MG capsule Take 1 capsule (100 mg total) by mouth every 12 (twelve) hours. 60 capsule 0  . escitalopram (LEXAPRO) 10 MG tablet Take 10 mg by mouth as needed. Depression/anxiety.    . furosemide (LASIX) 40 MG tablet Take 40 mg by mouth daily.    Marland Kitchen glipiZIDE (GLUCOTROL XL) 2.5 MG 24 hr tablet Take 1 tablet by mouth 2 (two) times daily.  0  . hydrALAZINE (APRESOLINE) 25 MG tablet Take 1 tablet (25 mg total) by mouth 3 (three) times daily. 90 tablet 3  . isosorbide mononitrate (IMDUR) 30 MG 24 hr tablet Take 1 tablet (30 mg total) by mouth daily. 30 tablet 6  . lactulose (CHRONULAC) 10 GM/15ML solution Take 15 mLs (10 g total) by mouth 2 (two) times daily. 240 mL 0  . LEVEMIR FLEXTOUCH 100 UNIT/ML Pen 10 units in the morning and 20 units at night    . liothyronine (CYTOMEL) 25 MCG tablet Take 25 mcg by mouth every morning.     . nitroGLYCERIN (NITROSTAT) 0.4 MG SL tablet Place 1 tablet (0.4 mg total) under the tongue every 5 (five) minutes as needed. 25 tablet 6  . rosuvastatin (CRESTOR) 10 MG tablet Take 10 mg by mouth daily.    Marland Kitchen SYNTHROID 125 MCG tablet Take 1 tablet by mouth daily.  5  . ZETIA 10 MG tablet Take 10 mg by mouth daily.      No current facility-administered medications for this visit.     Allergies: Allergies  Allergen Reactions  . Peanut Oil Anaphylaxis  . Ciprofloxacin Diarrhea  . Codeine     Social History: The patient  reports that she has never smoked. She has never used  smokeless tobacco. She reports that she does not drink alcohol or use drugs.   Family History: The patient's family history includes Cancer in her mother; Heart attack in her mother; Heart disease in her father; Stroke in her father.   Review of Systems: Please see the history of present illness.   Otherwise, the review of systems is positive for none.   All other systems are reviewed and negative.   Physical Exam: VS:  BP 90/60 (BP Location: Right Arm, Patient Position: Sitting, Cuff Size: Large)   Pulse (!) 55   Ht 5\' 2"  (  1.575 m)   Wt 198 lb 12.8 oz (90.2 kg)   SpO2 92%   BMI 36.36 kg/m  .  BMI Body mass index is 36.36 kg/m.  Wt Readings from Last 3 Encounters:  08/25/2016 198 lb 12.8 oz (90.2 kg)  08/25/16 189 lb (85.7 kg)  08/20/16 189 lb (85.7 kg)    General: She looks sallow.   HEENT: Normal.  Neck: Supple, no JVD, carotid bruits, or masses noted.  Cardiac: Regular rate and rhythm. No murmurs, rubs, or gallops. + ankle edema.  Respiratory:  Lungs are clear to auscultation bilaterally with normal work of breathing.  GI: Soft and nontender.  MS: No deformity or atrophy. Gait not tested today - she is in a wheelchair.  Skin: Warm and dry. Color is quite sallow Neuro:  Strength and sensation are intact and no gross focal deficits noted.  Psych: Alert, appropriate and with normal affect.   LABORATORY DATA:  EKG:  EKG is ordered today. NSR with poor R wave progression. No acute changes - reviewed by Dr. Curt Bears.   Lab Results  Component Value Date   WBC 6.1 08/13/2016   HGB 11.8 08/13/2016   HCT 35.8 08/13/2016   PLT 193 08/13/2016   GLUCOSE 101 (H) 08/20/2016   CHOL (H) 05/09/2007    256        ATP III CLASSIFICATION:  <200     mg/dL   Desirable  200-239  mg/dL   Borderline High  >=240    mg/dL   High   TRIG 123 05/09/2007   HDL 57 05/09/2007   LDLCALC (H) 05/09/2007    174        Total Cholesterol/HDL:CHD Risk Coronary Heart Disease Risk Table                      Men   Women  1/2 Average Risk   3.4   3.3   ALT 24 07/28/2016   AST 46 (H) 07/28/2016   NA 140 08/20/2016   K 4.0 08/20/2016   CL 104 08/20/2016   CREATININE 1.73 (H) 08/20/2016   BUN 51 (H) 08/20/2016   CO2 27 08/20/2016   TSH 12.330 (H) 07/28/2016   INR 1.1 08/13/2016   HGBA1C (H) 05/09/2007    12.6 (NOTE)   The ADA recommends the following therapeutic goals for glycemic   control related to Hgb A1C measurement:   Goal of Therapy:   < 7.0% Hgb A1C   Action Suggested:  > 8.0% Hgb A1C   Ref:  Diabetes Care, 22, Suppl. 1, 1999     BNP (last 3 results) No results for input(s): BNP in the last 8760 hours.  ProBNP (last 3 results)  Recent Labs  07/28/16 1135  PROBNP 15,991*     Other Studies Reviewed Today:  Cardiac Cath Conclusion 08/20/2016  1. Severe multivessel coronary artery disease with critical stenosis of the ostium of the LAD, critical stenosis at the ostium of the left circumflex, moderate distal left main stenosis extending into the intermediate branch which is a large vessel, and severe stenosis of the right coronary artery 2. Known severe cardiomyopathy with LVEF less than 25% 3. Elevated right heart pressures consistent with congestive heart failure and volume overload  Recommendations: The patient has acute on chronic heart failure with slowly progressive symptoms. Will adjust her medical therapy to better diuresis her. Unfortunately she has severe coronary artery disease without good percutaneous treatment options. The only lesion that I think  could be successfully treated with PCI would be a tight right coronary artery stenosis. I'm not sure that she would be a good candidate for multivessel CABG considering her comorbid conditions, severely reduced LV function, and question of viability in the anterior wall based on her nuclear scan findings. However, without other good revascularization options, will refer her for an outpatient BX surgical evaluation.      Myoview Study Highlights 07/2016    The left ventricular ejection fraction is severely decreased (<30%).  Nuclear stress EF: 19%.  No T wave inversion was noted during stress.  There was no ST segment deviation noted during stress.  Defect 1: There is a large defect of severe severity.  Findings consistent with prior myocardial infarction.  This is a high risk study.  Large size, severe severity fixed anterior, septal, inferior and apical perfusion defect with sparing of the basal anterior and lateral wall, consistent with scar. Moderately dilated ventricle with LVEF 19% and severe global hypokinesis with preserved lateral wall motion. This is a high risk study.    Echo Study Conclusions 07/23/2016  - Left ventricle: The cavity size was mildly dilated. Systolic function was severely reduced. The estimated ejection fraction was in the range of 20% to 25%. Diffuse hypokinesis. Features are consistent with a pseudonormal left ventricular filling pattern, with concomitant abnormal relaxation and increased filling pressure (grade 2 diastolic dysfunction). Doppler parameters are consistent with high ventricular filling pressure. - Aortic valve: Valve mobility was restricted. - Mitral valve: Calcified annulus. There was mild regurgitation. - Left atrium: The atrium was mildly dilated. - Pericardium, extracardiac: A trivial pericardial effusion was identified.  Impressions:  - Definity used; severe global reduction in LV systolic function; mild LVE; moderate diastolic dysfunction with elevated filing pressure; mild MR; mild LAE.   Assessment/Plan:  1. Ischemic cardiomyopathy with systolic HF - EF now 20 to 25%. She is now on low dose beta blocker. We have had to stop her ARB and she is on Imdur/Hydralazine. She has had high risk Myoview and subsequent cath - with severe CAD - currently being evaluated by [facility] for possible CABG - currently doing  very poorly with worsening shortness of breath/chest pain and now with dry heaving.  Weight back up. More chest pain. EKG not acute today. Worry about worsening kidney function as well. Discussed with Dr. Curt Bears (D[facility]) will plan to admit to hospital with acute systolic HF and unstable angina and possible worsening CKD. She is very agreeable to being admitted. Will alert Dr. Prescott Gum as well. Cardiac MRI for tomorrow.   2. CAD - severe CAD noted on recent cath.    3. HLD - on statin therapy  4. HTN - BP low - not really own a lot of agents any more. No longer on ARB. May have to hold the Hydralazine.   5. Hoarseness- this seems resolved - I suspected this was due to her use of the inhaler.   6. Worsening CKD - recheck today. No longer on her ARB and she is now seeing nephrology.   7. Hypothyroidism - will repeat her TSH - will cut her dose of Synthroid back as well. She is also on Cytomel - may need internal medicine to see/address.    Current medicines are reviewed with the patient today.  The patient does not have concerns regarding medicines other than what has been noted above.  The following changes have been made:  See above.  Labs/ tests ordered today include:   No  orders of the defined types were placed in this encounter.    Disposition:   Admitting to [facility] today.   Patient is agreeable to this plan and will call if any problems develop in the interim.   SignedTruitt Merle, NP  09/05/2016 8:35 AM  [facility] [address] [place], [place]  [address] Phone: [phone] Fax: [phone]

## 2016-09-02 NOTE — Patient Instructions (Addendum)
   We are admitting you to the hospital today      If you need a refill on your cardiac medications before your next appointment, please call your pharmacy.   Call the [place] office at [phone] if you have any questions, problems or concerns.

## 2016-09-02 NOTE — H&P (Signed)
Office Visit   09/20/2016 [facility]  [place], Marlane Hatcher, NP  Cardiology   Ischemic cardiomyopathy +5 more  Dx   Coronary Artery Disease ; Referred by Anda Kraft, MD  Reason for Visit   Additional Documentation   Vitals:   BP 90/60 (BP Location: Right Arm, Patient Position: Sitting, Cuff Size: Large)   Pulse  55   Ht 5\' 2"  (1.575 m)   Wt 198 lb 12.8 oz (90.2 kg)   SpO2 92%   BMI 36.36 kg/m   BSA 1.99 m      More Vitals   Flowsheets:   Custom Formula Data,   Anthropometrics,   MEWS Score     Encounter Info:   Billing Info,   History,   Allergies,   Detailed Report     All Notes   Addendum Note by Burtis Junes, NP at 09/10/2016 8:30 AM   Author: Burtis Junes, NP Author Type: Nurse Practitioner Filed: 09/11/2016 9:13 AM  Note Status: Signed Cosign: Cosign Not Required Encounter Date: 08/23/2016  Editor: Burtis Junes, NP (Nurse Practitioner)    Addended by: Burtis Junes on: 08/31/2016 09:13 AM   Modules accepted: Orders, SmartSet     Progress Notes by Burtis Junes, NP at 08/26/2016 8:30 AM   Author: Burtis Junes, NP Author Type: Nurse Practitioner Filed: 08/28/2016 9:13 AM  Note Status: Addendum Cosign: Cosign Not Required Encounter Date: 09/14/2016  Editor: Burtis Junes, NP (Nurse Practitioner)  Prior Versions: 1. Burtis Junes, NP (Nurse Practitioner) at 08/28/2016 8:54 AM - Addendum   2. Burtis Junes, NP (Nurse Practitioner) at 09/05/2016 8:51 AM - Signed  Expand All Collapse All       CARDIOLOGY OFFICE NOTE  Date:  09/14/2016    Jillian Willis Date of Birth: Oct 09, 1940 Medical Record #[id number]  PCP:  Anda Kraft, MD         Cardiologist:  [facility]        Chief Complaint  Patient presents with  . Coronary Artery Disease    Post cath visit - seen for Dr. Kellie Shropshire    History of Present Illness: Jillian Willis is a 76 y.o. female who presents today  for a post cath visit. Seen for Dr. Stanford Breed and Dr. Burt Knack. She is a former patient of Dr. Susa Simmonds. She has expressed her desire to follow with me going forward.   She has known CAD with remote angioplasty of the mid LAD back in 2004. Last cath in 2009 showed a 30-40% left main, 60-70% ostial circumflex which was relatively small, 30-40% proximal LAD. Ejection fraction was 50-55% with mild anterior hypokinesis. Nuclear study June 2015 showed a small anteroapical scar with minimal reversibility. Ejection fraction 61%. Treated medically.   Patient seen recently by primary care with complaints of dyspnea. Chest x-ray showed congestive heart failure with small bilateral pleural effusions. Apparently given antibiotics. Echocardiogram showed ejection fraction 32-35%, grade 2 diastolic dysfunction, mild mitral regurgitation and mild left atrial enlargement. She was diuresed. Also placed on antibiotics. Seen initially by Dr. Stanford Breed and then by me - our plan was to try and tune her up and get her set up for cardiac cath - however labs showed worsening kidney function with very high BNP - Myoview then ordered - this was quiteabnormal. She did improve some clinically with medical therapy but her Lasix had to be cut back, ARB stopped and was placed on nitrates/hydralazine. Ended up referring  on for cardiac cath at the end of May with Dr. Burt Knack - see below - quite abnormal - has been referred to [facility] and has evaluation underway with plans for cardiac MRI/vein mapping. She has been referred to Nephrology in the interim.   Comes in today. Here with her husband. She is sick. Feels bad. Dry heaving since 3 AM today. Says she has done ok since the cath until the past few days. Feet have started swelling some. Weight is up 9 pounds. Had renal scan yesterday. To go back to Dr. Hollie Salk in 4 months. More short of breath over the past few days. Legs feel "like bolders". Still with chest pain. Says she "can't do anything"  without being short of breath and having chest pain. Has really seemed to back slid. For cardiac MRI and follow up with Dr. Darcey Nora tomorrow. Noted elevated TSH - her dose of Synthroid has not been adjusted recently.       Past Medical History:  Diagnosis Date  . Cancer ([place])    L breast CA  . Depression   . Diabetes mellitus   . GERD (gastroesophageal reflux disease)   . Hyperlipidemia   . Hypertension   . Hypothyroidism   . IHD (ischemic heart disease)    s/p cath in 2009 showing heavily calcified left main with mild to moderate diffuse CAD. Remote angioplasty of the mid LAD in 2004.  Last myoview in 2012 felt to be low risk.   . Obesity          Past Surgical History:  Procedure Laterality Date  . BREAST SURGERY  2000   lympectomy on Left Br  . BREAST SURGERY  2005   Right Br Reduction  . CARDIAC CATHETERIZATION  06/29/2007   EF 50-55%  . CARDIAC CATHETERIZATION  11/14/2002   EF 55-60%  . CARDIAC CATHETERIZATION  05/09/2002   EF 45%  . CARDIOVASCULAR STRESS TEST  05/23/10   EF 68%, NO ISCHEMIA  . RIGHT/LEFT HEART CATH AND CORONARY ANGIOGRAPHY N/A 08/20/2016   Procedure: Right/Left Heart Cath and Coronary Angiography;  Surgeon: Sherren Mocha, MD;  Location: [facility];  Service: Cardiovascular;  Laterality: N/A;     Medications:       Current Outpatient Prescriptions  Medication Sig Dispense Refill  . ACCU-CHEK SMARTVIEW test strip     . aspirin 81 MG tablet Take 81 mg by mouth daily.     . bisacodyl (BISACODYL LAXATIVE) 10 MG suppository Place 1 suppository (10 mg total) rectally as needed for moderate constipation. 12 suppository 0  . carvedilol (COREG) 3.125 MG tablet Take 1 tablet (3.125 mg total) by mouth 2 (two) times daily. 180 tablet 3  . Chlorphen-Pyril-Phenyleph (TRIPLEX AD PO) Take 1 tablet by mouth daily.    . Choline Fenofibrate (FENOFIBRIC ACID) 135 MG CPDR Take 135 mcg by mouth daily.    . diclofenac sodium  (VOLTAREN) 1 % GEL Apply 2 g topically 4 (four) times daily as needed. Apply 2-4 grams to affected area 4 times a day   1  . docusate sodium (COLACE) 100 MG capsule Take 1 capsule (100 mg total) by mouth every 12 (twelve) hours. 60 capsule 0  . escitalopram (LEXAPRO) 10 MG tablet Take 10 mg by mouth as needed. Depression/anxiety.    . furosemide (LASIX) 40 MG tablet Take 40 mg by mouth daily.    Marland Kitchen glipiZIDE (GLUCOTROL XL) 2.5 MG 24 hr tablet Take 1 tablet by mouth 2 (two) times daily.  0  . hydrALAZINE (APRESOLINE) 25 MG tablet Take 1 tablet (25 mg total) by mouth 3 (three) times daily. 90 tablet 3  . isosorbide mononitrate (IMDUR) 30 MG 24 hr tablet Take 1 tablet (30 mg total) by mouth daily. 30 tablet 6  . lactulose (CHRONULAC) 10 GM/15ML solution Take 15 mLs (10 g total) by mouth 2 (two) times daily. 240 mL 0  . LEVEMIR FLEXTOUCH 100 UNIT/ML Pen 10 units in the morning and 20 units at night    . liothyronine (CYTOMEL) 25 MCG tablet Take 25 mcg by mouth every morning.     . nitroGLYCERIN (NITROSTAT) 0.4 MG SL tablet Place 1 tablet (0.4 mg total) under the tongue every 5 (five) minutes as needed. 25 tablet 6  . rosuvastatin (CRESTOR) 10 MG tablet Take 10 mg by mouth daily.    Marland Kitchen SYNTHROID 125 MCG tablet Take 1 tablet by mouth daily.  5  . ZETIA 10 MG tablet Take 10 mg by mouth daily.      No current facility-administered medications for this visit.     Allergies:     Allergies  Allergen Reactions  . Peanut Oil Anaphylaxis  . Ciprofloxacin Diarrhea  . Codeine     Social History: The patient  reports that she has never smoked. She has never used smokeless tobacco. She reports that she does not drink alcohol or use drugs.   Family History: The patient's family history includes Cancer in her mother; Heart attack in her mother; Heart disease in her father; Stroke in her father.   Review of Systems: Please see the history of present illness.   Otherwise, the review  of systems is positive for none.   All other systems are reviewed and negative.   Physical Exam: VS:  BP 90/60 (BP Location: Right Arm, Patient Position: Sitting, Cuff Size: Large)   Pulse (!) 55   Ht 5\' 2"  (1.575 m)   Wt 198 lb 12.8 oz (90.2 kg)   SpO2 92%   BMI 36.36 kg/m  .  BMI Body mass index is 36.36 kg/m.     Wt Readings from Last 3 Encounters:  09/17/2016 198 lb 12.8 oz (90.2 kg)  08/25/16 189 lb (85.7 kg)  08/20/16 189 lb (85.7 kg)    General: She looks sallow.   HEENT: Normal.  Neck: Supple, no JVD, carotid bruits, or masses noted.  Cardiac: Regular rate and rhythm. No murmurs, rubs, or gallops. + ankle edema.  Respiratory:  Lungs are clear to auscultation bilaterally with normal work of breathing.  GI: Soft and nontender.  MS: No deformity or atrophy. Gait not tested today - she is in a wheelchair.  Skin: Warm and dry. Color is quite sallow Neuro:  Strength and sensation are intact and no gross focal deficits noted.  Psych: Alert, appropriate and with normal affect.   LABORATORY DATA:  EKG:  EKG is not ordered today.  RecentLabs  Lab Results  Component Value Date   WBC 6.1 08/13/2016   HGB 11.8 08/13/2016   HCT 35.8 08/13/2016   PLT 193 08/13/2016   GLUCOSE 101 (H) 08/20/2016   CHOL (H) 05/09/2007    256        ATP III CLASSIFICATION:  <200     mg/dL   Desirable  200-239  mg/dL   Borderline High  >=240    mg/dL   High   TRIG 123 05/09/2007   HDL 57 05/09/2007   LDLCALC (H) 05/09/2007    174  Total Cholesterol/HDL:CHD Risk Coronary Heart Disease Risk Table                     Men   Women  1/2 Average Risk   3.4   3.3   ALT 24 07/28/2016   AST 46 (H) 07/28/2016   NA 140 08/20/2016   K 4.0 08/20/2016   CL 104 08/20/2016   CREATININE 1.73 (H) 08/20/2016   BUN 51 (H) 08/20/2016   CO2 27 08/20/2016   TSH 12.330 (H) 07/28/2016   INR 1.1 08/13/2016   HGBA1C (H) 05/09/2007    12.6 (NOTE)   The ADA recommends  the following therapeutic goals for glycemic   control related to Hgb A1C measurement:   Goal of Therapy:   < 7.0% Hgb A1C   Action Suggested:  > 8.0% Hgb A1C   Ref:  Diabetes Care, 22, Suppl. 1, 1999       BNP (last 3 results) RecentLabs(withinlast365days)  No results for input(s): BNP in the last 8760 hours.    ProBNP (last 3 results)  RecentLabs(withinlast365days)   Recent Labs  07/28/16 1135  PROBNP 15,991*       Other Studies Reviewed Today:  Cardiac Cath Conclusion 08/20/2016  1. Severe multivessel coronary artery disease with critical stenosis of the ostium of the LAD, critical stenosis at the ostium of the left circumflex, moderate distal left main stenosis extending into the intermediate branch which is a large vessel, and severe stenosis of the right coronary artery 2. Known severe cardiomyopathy with LVEF less than 25% 3. Elevated right heart pressures consistent with congestive heart failure and volume overload  Recommendations: The patient has acute on chronic heart failure with slowly progressive symptoms. Will adjust her medical therapy to better diuresis her. Unfortunately she has severe coronary artery disease without good percutaneous treatment options. The only lesion that I think could be successfully treated with PCI would be a tight right coronary artery stenosis. I'm not sure that she would be a good candidate for multivessel CABG considering her comorbid conditions, severely reduced LV function, and question of viability in the anterior wall based on her nuclear scan findings. However, without other good revascularization options, will refer her for an outpatient BX surgical evaluation.     Myoview Study Highlights 07/2016    The left ventricular ejection fraction is severely decreased (<30%).  Nuclear stress EF: 19%.  No T wave inversion was noted during stress.  There was no ST segment deviation noted during  stress.  Defect 1: There is a large defect of severe severity.  Findings consistent with prior myocardial infarction.  This is a high risk study.  Large size, severe severity fixed anterior, septal, inferior and apical perfusion defect with sparing of the basal anterior and lateral wall, consistent with scar. Moderately dilated ventricle with LVEF 19% and severe global hypokinesis with preserved lateral wall motion. This is a high risk study.    Echo Study Conclusions 07/23/2016  - Left ventricle: The cavity size was mildly dilated. Systolic function was severely reduced. The estimated ejection fraction was in the range of 20% to 25%. Diffuse hypokinesis. Features are consistent with a pseudonormal left ventricular filling pattern, with concomitant abnormal relaxation and increased filling pressure (grade 2 diastolic dysfunction). Doppler parameters are consistent with high ventricular filling pressure. - Aortic valve: Valve mobility was restricted. - Mitral valve: Calcified annulus. There was mild regurgitation. - Left atrium: The atrium was mildly dilated. - Pericardium, extracardiac: A trivial pericardial  effusion was identified.  Impressions:  - Definity used; severe global reduction in LV systolic function; mild LVE; moderate diastolic dysfunction with elevated filing pressure; mild MR; mild LAE.   Assessment/Plan:  1. Ischemic cardiomyopathy with systolic HF - EF now 20 to 25%. She is now on low dose beta blocker. We have had to stop her ARB and she is on Imdur/Hydralazine. She has had high risk Myoview and subsequent cath - with severe CAD - currently being evaluated by [facility] for possible CABG - currently doing very poorly with worsening shortness of breath/chest pain and now with dry heaving.  Weight back up. More chest pain. EKG not acute today. Worry about worsening kidney function as well. Discussed with Dr. Curt Bears (DOD) will plan to admit to  hospital with acute systolic HF and unstable angina and possible worsening CKD. She is very agreeable to being admitted. Will alert Dr. Prescott Gum as well. Cardiac MRI for tomorrow.   2. CAD - severe CAD noted on recent cath.    3. HLD - on statin therapy  4. HTN - BP low - not really own a lot of agents any more. No longer on ARB. May have to hold the Hydralazine.   5. Hoarseness- this seems resolved - I suspected this was due to her use of the inhaler.   6. Worsening CKD - recheck today. No longer on her ARB and she is now seeing nephrology.   7. Hypothyroidism - will repeat her TSH - will cut her dose of Synthroid back as well. She is also on Cytomel - may need internal medicine to see/address.    Current medicines are reviewed with the patient today.  The patient does not have concerns regarding medicines other than what has been noted above.  The following changes have been made:  See above.  Labs/ tests ordered today include:   No orders of the defined types were placed in this encounter.    Disposition:   Admitting to [facility] today.   Patient is agreeable to this plan and will call if any problems develop in the interim.   SignedTruitt Merle, NP  09/20/2016 8:35 AM  [facility] [address] [place], [place]  [address] Phone: [phone] Fax: [phone]           Patient Instructions by Burtis Junes, NP at 08/30/2016 8:30 AM   Author: Burtis Junes, NP Author Type: Nurse Practitioner Filed: 08/29/2016 8:42 AM  Note Status: Addendum Cosign: Cosign Not Required Encounter Date: 09/07/2016  Editor: Burtis Junes, NP (Nurse Practitioner)  Prior Versions: 1. Burtis Junes, NP (Nurse Practitioner) at 09/05/2016 8:01 AM - Signed     We are admitting you to the hospital today      If you need a refill on your cardiac medications before your next appointment, please call your pharmacy.    Call the [place] office at [phone] if you have any questions, problems or concerns.         Instructions   Patient Instructions        Communications      [facility] Provider CC Chart Rep sent to Anda Kraft, MD     Chart Routed to Constance Haw, MD and 2 others     Chart Routed to Prescott Gum, Collier Salina, MD  Media   Electronic signature on 09/18/2016 8:01 AM   Communication Routing History   Recipient Method  Sent by Date Sent  Anda Kraft, MD Fax Burtis Junes, NP 09/05/2016  Fax: [phone] Phone: [phone]   Orders Placed    EKG 12-Lead  Medication Changes     None    Medication List  Visit Diagnoses      Ischemic cardiomyopathy    Acute systolic heart failure ([place])    Coronary artery disease involving native coronary artery of native heart without angina pectoris    Essential hypertension    Dilated cardiomyopathy ([place])    S/P cardiac catheterization    Problem List  Level of Service   Level of Service  LOS - NO CHARGE [NC1]  All Charges for This Encounter   Code  NC1  Description: LOS - NO CHARGE  Service Date: 09/03/2016  Service Provider: Burtis Junes, NP  Qty: 1

## 2016-09-02 NOTE — Addendum Note (Signed)
Addended by: Burtis Junes on: 08/27/2016 09:13 AM   Modules accepted: Orders, SmartSet

## 2016-09-03 ENCOUNTER — Encounter: Payer: Medicare Other | Admitting: Cardiothoracic Surgery

## 2016-09-03 ENCOUNTER — Ambulatory Visit (HOSPITAL_COMMUNITY): Admission: RE | Admit: 2016-09-03 | Payer: Medicare Other | Source: Ambulatory Visit

## 2016-09-03 ENCOUNTER — Encounter (HOSPITAL_COMMUNITY): Payer: Self-pay | Admitting: Anesthesiology

## 2016-09-03 ENCOUNTER — Inpatient Hospital Stay (HOSPITAL_COMMUNITY): Admission: AD | Disposition: E | Payer: Self-pay | Source: Ambulatory Visit | Attending: Cardiothoracic Surgery

## 2016-09-03 ENCOUNTER — Observation Stay (HOSPITAL_COMMUNITY): Payer: Medicare Other | Admitting: Anesthesiology

## 2016-09-03 ENCOUNTER — Other Ambulatory Visit: Payer: Self-pay | Admitting: Nurse Practitioner

## 2016-09-03 ENCOUNTER — Inpatient Hospital Stay (HOSPITAL_COMMUNITY): Payer: Medicare Other

## 2016-09-03 ENCOUNTER — Observation Stay (HOSPITAL_COMMUNITY): Payer: Medicare Other

## 2016-09-03 DIAGNOSIS — R34 Anuria and oliguria: Secondary | ICD-10-CM | POA: Diagnosis not present

## 2016-09-03 DIAGNOSIS — F17210 Nicotine dependence, cigarettes, uncomplicated: Secondary | ICD-10-CM | POA: Diagnosis present

## 2016-09-03 DIAGNOSIS — E872 Acidosis: Secondary | ICD-10-CM | POA: Diagnosis not present

## 2016-09-03 DIAGNOSIS — N184 Chronic kidney disease, stage 4 (severe): Secondary | ICD-10-CM | POA: Diagnosis not present

## 2016-09-03 DIAGNOSIS — I130 Hypertensive heart and chronic kidney disease with heart failure and stage 1 through stage 4 chronic kidney disease, or unspecified chronic kidney disease: Secondary | ICD-10-CM | POA: Diagnosis not present

## 2016-09-03 DIAGNOSIS — D62 Acute posthemorrhagic anemia: Secondary | ICD-10-CM | POA: Diagnosis not present

## 2016-09-03 DIAGNOSIS — I255 Ischemic cardiomyopathy: Secondary | ICD-10-CM | POA: Diagnosis not present

## 2016-09-03 DIAGNOSIS — I959 Hypotension, unspecified: Secondary | ICD-10-CM | POA: Diagnosis not present

## 2016-09-03 DIAGNOSIS — J9601 Acute respiratory failure with hypoxia: Secondary | ICD-10-CM | POA: Diagnosis not present

## 2016-09-03 DIAGNOSIS — I5043 Acute on chronic combined systolic (congestive) and diastolic (congestive) heart failure: Secondary | ICD-10-CM | POA: Diagnosis not present

## 2016-09-03 DIAGNOSIS — I25110 Atherosclerotic heart disease of native coronary artery with unstable angina pectoris: Secondary | ICD-10-CM | POA: Diagnosis not present

## 2016-09-03 DIAGNOSIS — I2510 Atherosclerotic heart disease of native coronary artery without angina pectoris: Secondary | ICD-10-CM | POA: Diagnosis present

## 2016-09-03 DIAGNOSIS — I4891 Unspecified atrial fibrillation: Secondary | ICD-10-CM | POA: Diagnosis not present

## 2016-09-03 DIAGNOSIS — R570 Cardiogenic shock: Secondary | ICD-10-CM | POA: Diagnosis not present

## 2016-09-03 DIAGNOSIS — D72829 Elevated white blood cell count, unspecified: Secondary | ICD-10-CM | POA: Diagnosis not present

## 2016-09-03 DIAGNOSIS — D696 Thrombocytopenia, unspecified: Secondary | ICD-10-CM | POA: Diagnosis present

## 2016-09-03 DIAGNOSIS — E1122 Type 2 diabetes mellitus with diabetic chronic kidney disease: Secondary | ICD-10-CM | POA: Diagnosis present

## 2016-09-03 DIAGNOSIS — D508 Other iron deficiency anemias: Secondary | ICD-10-CM

## 2016-09-03 DIAGNOSIS — Z951 Presence of aortocoronary bypass graft: Secondary | ICD-10-CM | POA: Diagnosis not present

## 2016-09-03 DIAGNOSIS — R0602 Shortness of breath: Secondary | ICD-10-CM | POA: Diagnosis not present

## 2016-09-03 DIAGNOSIS — I517 Cardiomegaly: Secondary | ICD-10-CM | POA: Diagnosis not present

## 2016-09-03 DIAGNOSIS — J9600 Acute respiratory failure, unspecified whether with hypoxia or hypercapnia: Secondary | ICD-10-CM | POA: Diagnosis not present

## 2016-09-03 DIAGNOSIS — I5023 Acute on chronic systolic (congestive) heart failure: Secondary | ICD-10-CM | POA: Diagnosis not present

## 2016-09-03 DIAGNOSIS — I25118 Atherosclerotic heart disease of native coronary artery with other forms of angina pectoris: Secondary | ICD-10-CM | POA: Diagnosis present

## 2016-09-03 DIAGNOSIS — I25810 Atherosclerosis of coronary artery bypass graft(s) without angina pectoris: Secondary | ICD-10-CM | POA: Diagnosis not present

## 2016-09-03 DIAGNOSIS — D631 Anemia in chronic kidney disease: Secondary | ICD-10-CM | POA: Diagnosis not present

## 2016-09-03 DIAGNOSIS — Z452 Encounter for adjustment and management of vascular access device: Secondary | ICD-10-CM | POA: Diagnosis not present

## 2016-09-03 DIAGNOSIS — Z9911 Dependence on respirator [ventilator] status: Secondary | ICD-10-CM | POA: Diagnosis not present

## 2016-09-03 DIAGNOSIS — N179 Acute kidney failure, unspecified: Secondary | ICD-10-CM | POA: Diagnosis not present

## 2016-09-03 DIAGNOSIS — I340 Nonrheumatic mitral (valve) insufficiency: Secondary | ICD-10-CM | POA: Diagnosis present

## 2016-09-03 DIAGNOSIS — Z515 Encounter for palliative care: Secondary | ICD-10-CM | POA: Diagnosis not present

## 2016-09-03 DIAGNOSIS — G4733 Obstructive sleep apnea (adult) (pediatric): Secondary | ICD-10-CM | POA: Diagnosis present

## 2016-09-03 DIAGNOSIS — I10 Essential (primary) hypertension: Secondary | ICD-10-CM | POA: Diagnosis not present

## 2016-09-03 DIAGNOSIS — E1165 Type 2 diabetes mellitus with hyperglycemia: Secondary | ICD-10-CM | POA: Diagnosis not present

## 2016-09-03 DIAGNOSIS — K55059 Acute (reversible) ischemia of intestine, part and extent unspecified: Secondary | ICD-10-CM | POA: Diagnosis not present

## 2016-09-03 DIAGNOSIS — I088 Other rheumatic multiple valve diseases: Secondary | ICD-10-CM | POA: Diagnosis not present

## 2016-09-03 DIAGNOSIS — I452 Bifascicular block: Secondary | ICD-10-CM | POA: Diagnosis not present

## 2016-09-03 DIAGNOSIS — E785 Hyperlipidemia, unspecified: Secondary | ICD-10-CM | POA: Diagnosis present

## 2016-09-03 HISTORY — PX: CORONARY ARTERY BYPASS GRAFT: SHX141

## 2016-09-03 HISTORY — PX: TEE WITHOUT CARDIOVERSION: SHX5443

## 2016-09-03 LAB — CBC
HCT: 34.4 % — ABNORMAL LOW (ref 36.0–46.0)
HCT: 24 % — ABNORMAL LOW (ref 36.0–46.0)
HEMATOCRIT: 31.2 % — AB (ref 36.0–46.0)
HEMOGLOBIN: 10.3 g/dL — AB (ref 12.0–15.0)
Hemoglobin: 10.8 g/dL — ABNORMAL LOW (ref 12.0–15.0)
Hemoglobin: 7.7 g/dL — ABNORMAL LOW (ref 12.0–15.0)
MCH: 30.7 pg (ref 26.0–34.0)
MCH: 30.4 pg (ref 26.0–34.0)
MCH: 29.7 pg (ref 26.0–34.0)
MCHC: 31.4 g/dL (ref 30.0–36.0)
MCHC: 33 g/dL (ref 30.0–36.0)
MCHC: 32.1 g/dL (ref 30.0–36.0)
MCV: 97.7 fL (ref 78.0–100.0)
MCV: 92 fL (ref 78.0–100.0)
MCV: 92.7 fL (ref 78.0–100.0)
Platelets: 193 10*3/uL (ref 150–400)
Platelets: 115 10*3/uL — ABNORMAL LOW (ref 150–400)
Platelets: 117 10*3/uL — ABNORMAL LOW (ref 150–400)
RBC: 3.52 MIL/uL — ABNORMAL LOW (ref 3.87–5.11)
RBC: 3.39 MIL/uL — AB (ref 3.87–5.11)
RBC: 2.59 MIL/uL — ABNORMAL LOW (ref 3.87–5.11)
RDW: 14.5 % (ref 11.5–15.5)
RDW: 15.7 % — ABNORMAL HIGH (ref 11.5–15.5)
RDW: 16.3 % — ABNORMAL HIGH (ref 11.5–15.5)
WBC: 5.9 10*3/uL (ref 4.0–10.5)
WBC: 14.9 10*3/uL — ABNORMAL HIGH (ref 4.0–10.5)
WBC: 10.8 10*3/uL — ABNORMAL HIGH (ref 4.0–10.5)

## 2016-09-03 LAB — POCT I-STAT 7, (LYTES, BLD GAS, ICA,H+H)
Acid-Base Excess: 4 mmol/L — ABNORMAL HIGH (ref 0.0–2.0)
Bicarbonate: 27.5 mmol/L (ref 20.0–28.0)
Calcium, Ion: 0.98 mmol/L — ABNORMAL LOW (ref 1.15–1.40)
HCT: 23 % — ABNORMAL LOW (ref 36.0–46.0)
Hemoglobin: 7.8 g/dL — ABNORMAL LOW (ref 12.0–15.0)
O2 Saturation: 100 %
Patient temperature: 33.6
Potassium: 3.3 mmol/L — ABNORMAL LOW (ref 3.5–5.1)
Sodium: 143 mmol/L (ref 135–145)
TCO2: 29 mmol/L (ref 0–100)
pCO2 arterial: 31.2 mmHg — ABNORMAL LOW (ref 32.0–48.0)
pH, Arterial: 7.54 — ABNORMAL HIGH (ref 7.350–7.450)
pO2, Arterial: 252 mmHg — ABNORMAL HIGH (ref 83.0–108.0)

## 2016-09-03 LAB — POCT I-STAT, CHEM 8
BUN: 62 mg/dL — ABNORMAL HIGH (ref 6–20)
BUN: 47 mg/dL — AB (ref 6–20)
BUN: 51 mg/dL — ABNORMAL HIGH (ref 6–20)
BUN: 54 mg/dL — AB (ref 6–20)
BUN: 48 mg/dL — AB (ref 6–20)
BUN: 50 mg/dL — ABNORMAL HIGH (ref 6–20)
BUN: 45 mg/dL — ABNORMAL HIGH (ref 6–20)
BUN: 42 mg/dL — ABNORMAL HIGH (ref 6–20)
CALCIUM ION: 1.03 mmol/L — AB (ref 1.15–1.40)
CALCIUM ION: 1.06 mmol/L — AB (ref 1.15–1.40)
CALCIUM ION: 1.22 mmol/L (ref 1.15–1.40)
CHLORIDE: 100 mmol/L — AB (ref 101–111)
CHLORIDE: 101 mmol/L (ref 101–111)
CHLORIDE: 97 mmol/L — AB (ref 101–111)
CHLORIDE: 99 mmol/L — AB (ref 101–111)
CHLORIDE: 101 mmol/L (ref 101–111)
CREATININE: 2.1 mg/dL — AB (ref 0.44–1.00)
CREATININE: 1.8 mg/dL — AB (ref 0.44–1.00)
CREATININE: 2.1 mg/dL — AB (ref 0.44–1.00)
Calcium, Ion: 1.2 mmol/L (ref 1.15–1.40)
Calcium, Ion: 0.98 mmol/L — ABNORMAL LOW (ref 1.15–1.40)
Calcium, Ion: 1 mmol/L — ABNORMAL LOW (ref 1.15–1.40)
Calcium, Ion: 1.17 mmol/L (ref 1.15–1.40)
Calcium, Ion: 1.36 mmol/L (ref 1.15–1.40)
Chloride: 97 mmol/L — ABNORMAL LOW (ref 101–111)
Chloride: 99 mmol/L — ABNORMAL LOW (ref 101–111)
Chloride: 105 mmol/L (ref 101–111)
Creatinine, Ser: 1.9 mg/dL — ABNORMAL HIGH (ref 0.44–1.00)
Creatinine, Ser: 1.8 mg/dL — ABNORMAL HIGH (ref 0.44–1.00)
Creatinine, Ser: 2 mg/dL — ABNORMAL HIGH (ref 0.44–1.00)
Creatinine, Ser: 1.7 mg/dL — ABNORMAL HIGH (ref 0.44–1.00)
Creatinine, Ser: 1.8 mg/dL — ABNORMAL HIGH (ref 0.44–1.00)
GLUCOSE: 113 mg/dL — AB (ref 65–99)
GLUCOSE: 133 mg/dL — AB (ref 65–99)
GLUCOSE: 92 mg/dL (ref 65–99)
GLUCOSE: 108 mg/dL — AB (ref 65–99)
Glucose, Bld: 92 mg/dL (ref 65–99)
Glucose, Bld: 89 mg/dL (ref 65–99)
Glucose, Bld: 96 mg/dL (ref 65–99)
Glucose, Bld: 111 mg/dL — ABNORMAL HIGH (ref 65–99)
HCT: 20 % — ABNORMAL LOW (ref 36.0–46.0)
HCT: 26 % — ABNORMAL LOW (ref 36.0–46.0)
HCT: 27 % — ABNORMAL LOW (ref 36.0–46.0)
HCT: 23 % — ABNORMAL LOW (ref 36.0–46.0)
HCT: 23 % — ABNORMAL LOW (ref 36.0–46.0)
HCT: 26 % — ABNORMAL LOW (ref 36.0–46.0)
HCT: 22 % — ABNORMAL LOW (ref 36.0–46.0)
HEMATOCRIT: 30 % — AB (ref 36.0–46.0)
HEMOGLOBIN: 6.8 g/dL — AB (ref 12.0–15.0)
HEMOGLOBIN: 7.8 g/dL — AB (ref 12.0–15.0)
HEMOGLOBIN: 8.8 g/dL — AB (ref 12.0–15.0)
Hemoglobin: 10.2 g/dL — ABNORMAL LOW (ref 12.0–15.0)
Hemoglobin: 8.8 g/dL — ABNORMAL LOW (ref 12.0–15.0)
Hemoglobin: 9.2 g/dL — ABNORMAL LOW (ref 12.0–15.0)
Hemoglobin: 7.8 g/dL — ABNORMAL LOW (ref 12.0–15.0)
Hemoglobin: 7.5 g/dL — ABNORMAL LOW (ref 12.0–15.0)
POTASSIUM: 3.6 mmol/L (ref 3.5–5.1)
POTASSIUM: 3.1 mmol/L — AB (ref 3.5–5.1)
Potassium: 3 mmol/L — ABNORMAL LOW (ref 3.5–5.1)
Potassium: 3.3 mmol/L — ABNORMAL LOW (ref 3.5–5.1)
Potassium: 3.8 mmol/L (ref 3.5–5.1)
Potassium: 4 mmol/L (ref 3.5–5.1)
Potassium: 3.4 mmol/L — ABNORMAL LOW (ref 3.5–5.1)
Potassium: 3.9 mmol/L (ref 3.5–5.1)
SODIUM: 138 mmol/L (ref 135–145)
Sodium: 140 mmol/L (ref 135–145)
Sodium: 140 mmol/L (ref 135–145)
Sodium: 142 mmol/L (ref 135–145)
Sodium: 142 mmol/L (ref 135–145)
Sodium: 140 mmol/L (ref 135–145)
Sodium: 139 mmol/L (ref 135–145)
Sodium: 140 mmol/L (ref 135–145)
TCO2: 29 mmol/L (ref 0–100)
TCO2: 26 mmol/L (ref 0–100)
TCO2: 28 mmol/L (ref 0–100)
TCO2: 29 mmol/L (ref 0–100)
TCO2: 30 mmol/L (ref 0–100)
TCO2: 30 mmol/L (ref 0–100)
TCO2: 29 mmol/L (ref 0–100)
TCO2: 24 mmol/L (ref 0–100)

## 2016-09-03 LAB — VAS US DOPPLER PRE CABG
LEFT ECA DIAS: 0 cm/s
LEFT VERTEBRAL DIAS: -14 cm/s
Left CCA dist dias: -16 cm/s
Left CCA dist sys: -64 cm/s
Left CCA prox dias: 21 cm/s
Left CCA prox sys: 66 cm/s
Left ICA dist dias: -27 cm/s
Left ICA dist sys: -116 cm/s
Left ICA prox dias: -55 cm/s
Left ICA prox sys: -171 cm/s
RIGHT ECA DIAS: 0 cm/s
RIGHT VERTEBRAL DIAS: -7 cm/s
Right CCA prox dias: -9 cm/s
Right CCA prox sys: -39 cm/s
Right cca dist sys: -64 cm/s

## 2016-09-03 LAB — POCT I-STAT 3, ART BLOOD GAS (G3+)
ACID-BASE EXCESS: 6 mmol/L — AB (ref 0.0–2.0)
ACID-BASE EXCESS: 8 mmol/L — AB (ref 0.0–2.0)
BICARBONATE: 29 mmol/L — AB (ref 20.0–28.0)
Bicarbonate: 30.3 mmol/L — ABNORMAL HIGH (ref 20.0–28.0)
Bicarbonate: 26.1 mmol/L (ref 20.0–28.0)
O2 SAT: 100 %
O2 Saturation: 100 %
O2 Saturation: 98 %
PCO2 ART: 34.2 mmHg (ref 32.0–48.0)
Patient temperature: 35.4
TCO2: 30 mmol/L (ref 0–100)
TCO2: 31 mmol/L (ref 0–100)
TCO2: 27 mmol/L (ref 0–100)
pCO2 arterial: 31.7 mmHg — ABNORMAL LOW (ref 32.0–48.0)
pCO2 arterial: 43.2 mmHg (ref 32.0–48.0)
pH, Arterial: 7.569 — ABNORMAL HIGH (ref 7.350–7.450)
pH, Arterial: 7.556 — ABNORMAL HIGH (ref 7.350–7.450)
pH, Arterial: 7.382 (ref 7.350–7.450)
pO2, Arterial: 374 mmHg — ABNORMAL HIGH (ref 83.0–108.0)
pO2, Arterial: 467 mmHg — ABNORMAL HIGH (ref 83.0–108.0)
pO2, Arterial: 99 mmHg (ref 83.0–108.0)

## 2016-09-03 LAB — CREATININE, SERUM
Creatinine, Ser: 1.89 mg/dL — ABNORMAL HIGH (ref 0.44–1.00)
GFR calc Af Amer: 29 mL/min — ABNORMAL LOW (ref >60)
GFR calc non Af Amer: 25 mL/min — ABNORMAL LOW (ref >60)

## 2016-09-03 LAB — GLUCOSE, CAPILLARY
GLUCOSE-CAPILLARY: 44 mg/dL — AB (ref 65–99)
Glucose-Capillary: 183 mg/dL — ABNORMAL HIGH (ref 65–99)
Glucose-Capillary: 37 mg/dL — CL (ref 65–99)
Glucose-Capillary: 119 mg/dL — ABNORMAL HIGH (ref 65–99)
Glucose-Capillary: 86 mg/dL (ref 65–99)
Glucose-Capillary: 70 mg/dL (ref 65–99)
Glucose-Capillary: 97 mg/dL (ref 65–99)
Glucose-Capillary: 102 mg/dL — ABNORMAL HIGH (ref 65–99)
Glucose-Capillary: 118 mg/dL — ABNORMAL HIGH (ref 65–99)
Glucose-Capillary: 116 mg/dL — ABNORMAL HIGH (ref 65–99)
Glucose-Capillary: 124 mg/dL — ABNORMAL HIGH (ref 65–99)

## 2016-09-03 LAB — BASIC METABOLIC PANEL
Anion gap: 9 (ref 5–15)
BUN: 63 mg/dL — ABNORMAL HIGH (ref 6–20)
CO2: 26 mmol/L (ref 22–32)
Calcium: 8.3 mg/dL — ABNORMAL LOW (ref 8.9–10.3)
Chloride: 102 mmol/L (ref 101–111)
Creatinine, Ser: 2.22 mg/dL — ABNORMAL HIGH (ref 0.44–1.00)
GFR calc Af Amer: 24 mL/min — ABNORMAL LOW (ref >60)
GFR calc non Af Amer: 21 mL/min — ABNORMAL LOW (ref >60)
Glucose, Bld: 47 mg/dL — ABNORMAL LOW (ref 65–99)
Potassium: 3.4 mmol/L — ABNORMAL LOW (ref 3.5–5.1)
Sodium: 137 mmol/L (ref 135–145)

## 2016-09-03 LAB — ECHO TEE
AO mean calculated velocity dopler: 109 cm/s
AV Mean grad: 6 mmHg
AV Peak grad: 11 mmHg
AV pk vel: 165 cm/s
LV dias vol index: 69 mL/m2
LV dias vol: 132 mL — AB (ref 46–106)
LV sys vol index: 46 mL/m2
LV sys vol: 88 mL — AB
Simpson's disk: 33
Stroke v: 44 ml
VTI: 40.8 cm

## 2016-09-03 LAB — SURGICAL PCR SCREEN
MRSA, PCR: NEGATIVE
STAPHYLOCOCCUS AUREUS: NEGATIVE

## 2016-09-03 LAB — POCT I-STAT EG7
Acid-Base Excess: 2 mmol/L (ref 0.0–2.0)
Bicarbonate: 25.3 mmol/L (ref 20.0–28.0)
Calcium, Ion: 1.22 mmol/L (ref 1.15–1.40)
HCT: 29 % — ABNORMAL LOW (ref 36.0–46.0)
Hemoglobin: 9.9 g/dL — ABNORMAL LOW (ref 12.0–15.0)
O2 Saturation: 67 %
Potassium: 3.3 mmol/L — ABNORMAL LOW (ref 3.5–5.1)
Sodium: 142 mmol/L (ref 135–145)
TCO2: 26 mmol/L (ref 0–100)
pCO2, Ven: 34.7 mmHg — ABNORMAL LOW (ref 44.0–60.0)
pH, Ven: 7.471 — ABNORMAL HIGH (ref 7.250–7.430)
pO2, Ven: 32 mmHg (ref 32.0–45.0)

## 2016-09-03 LAB — POCT I-STAT 4, (NA,K, GLUC, HGB,HCT)
Glucose, Bld: 122 mg/dL — ABNORMAL HIGH (ref 65–99)
Glucose, Bld: 98 mg/dL (ref 65–99)
Glucose, Bld: 127 mg/dL — ABNORMAL HIGH (ref 65–99)
Glucose, Bld: 95 mg/dL (ref 65–99)
HCT: 28 % — ABNORMAL LOW (ref 36.0–46.0)
HCT: 28 % — ABNORMAL LOW (ref 36.0–46.0)
HCT: 26 % — ABNORMAL LOW (ref 36.0–46.0)
HCT: 30 % — ABNORMAL LOW (ref 36.0–46.0)
Hemoglobin: 9.5 g/dL — ABNORMAL LOW (ref 12.0–15.0)
Hemoglobin: 9.5 g/dL — ABNORMAL LOW (ref 12.0–15.0)
Hemoglobin: 8.8 g/dL — ABNORMAL LOW (ref 12.0–15.0)
Hemoglobin: 10.2 g/dL — ABNORMAL LOW (ref 12.0–15.0)
Potassium: 3.1 mmol/L — ABNORMAL LOW (ref 3.5–5.1)
Potassium: 3.2 mmol/L — ABNORMAL LOW (ref 3.5–5.1)
Potassium: 3.1 mmol/L — ABNORMAL LOW (ref 3.5–5.1)
Potassium: 3.3 mmol/L — ABNORMAL LOW (ref 3.5–5.1)
Sodium: 142 mmol/L (ref 135–145)
Sodium: 142 mmol/L (ref 135–145)
Sodium: 141 mmol/L (ref 135–145)
Sodium: 141 mmol/L (ref 135–145)

## 2016-09-03 LAB — APTT: aPTT: 32 seconds (ref 24–36)

## 2016-09-03 LAB — MAGNESIUM: Magnesium: 3.1 mg/dL — ABNORMAL HIGH (ref 1.7–2.4)

## 2016-09-03 LAB — PREPARE RBC (CROSSMATCH)

## 2016-09-03 LAB — PROTIME-INR
INR: 1.6
Prothrombin Time: 19.2 seconds — ABNORMAL HIGH (ref 11.4–15.2)

## 2016-09-03 LAB — TROPONIN I: Troponin I: 0.2 ng/mL (ref <0.03)

## 2016-09-03 LAB — PLATELET COUNT: Platelets: 90 10*3/uL — ABNORMAL LOW (ref 150–400)

## 2016-09-03 LAB — HEMOGLOBIN AND HEMATOCRIT, BLOOD
HCT: 24 % — ABNORMAL LOW (ref 36.0–46.0)
Hemoglobin: 7.8 g/dL — ABNORMAL LOW (ref 12.0–15.0)

## 2016-09-03 SURGERY — CORONARY ARTERY BYPASS GRAFTING (CABG)
Anesthesia: General | Site: Chest

## 2016-09-03 MED ORDER — SODIUM CHLORIDE 0.9 % IV SOLN: Freq: Once | INTRAVENOUS | Status: DC

## 2016-09-03 MED ORDER — POTASSIUM CHLORIDE 10 MEQ/50ML IV SOLN
10.0000 meq | INTRAVENOUS | Status: AC
  Administered 2016-09-03 (×3): 10 meq via INTRAVENOUS

## 2016-09-03 MED ORDER — CALCIUM CHLORIDE 10 % IV SOLN
INTRAVENOUS | Status: DC | PRN
  Administered 2016-09-03: 500 mg via INTRAVENOUS
  Administered 2016-09-03: 200 mg via INTRAVENOUS

## 2016-09-03 MED ORDER — CHLORHEXIDINE GLUCONATE 0.12% ORAL RINSE (MEDLINE KIT)
15.0000 mL | Freq: Two times a day (BID) | OROMUCOSAL | Status: DC
  Administered 2016-09-03 – 2016-09-04 (×2): 15 mL via OROMUCOSAL

## 2016-09-03 MED ORDER — 0.9 % SODIUM CHLORIDE (POUR BTL) OPTIME
TOPICAL | Status: DC | PRN
  Administered 2016-09-03: 6000 mL

## 2016-09-03 MED ORDER — DEXTROSE 50 % IV SOLN
INTRAVENOUS | Status: AC
  Filled 2016-09-03: qty 50

## 2016-09-03 MED ORDER — ROCURONIUM BROMIDE 100 MG/10ML IV SOLN
INTRAVENOUS | Status: DC | PRN
  Administered 2016-09-03 (×3): 50 mg via INTRAVENOUS

## 2016-09-03 MED ORDER — ORAL CARE MOUTH RINSE
15.0000 mL | Freq: Four times a day (QID) | OROMUCOSAL | Status: DC
  Administered 2016-09-03 – 2016-09-04 (×5): 15 mL via OROMUCOSAL

## 2016-09-03 MED ORDER — DOCUSATE SODIUM 100 MG PO CAPS
200.0000 mg | ORAL_CAPSULE | Freq: Every day | ORAL | Status: DC
  Administered 2016-09-05: 200 mg via ORAL
  Filled 2016-09-03: qty 2

## 2016-09-03 MED ORDER — ETOMIDATE 2 MG/ML IV SOLN
INTRAVENOUS | Status: DC | PRN
  Administered 2016-09-03: 11 mg via INTRAVENOUS

## 2016-09-03 MED ORDER — INSULIN REGULAR BOLUS VIA INFUSION
0.0000 [IU] | Freq: Three times a day (TID) | INTRAVENOUS | Status: DC
  Filled 2016-09-03: qty 10

## 2016-09-03 MED ORDER — HEPARIN SODIUM (PORCINE) 1000 UNIT/ML IJ SOLN
INTRAMUSCULAR | Status: DC | PRN
  Administered 2016-09-03: 2000 [IU] via INTRAVENOUS
  Administered 2016-09-03: 33000 [IU] via INTRAVENOUS

## 2016-09-03 MED ORDER — FENTANYL CITRATE (PF) 250 MCG/5ML IJ SOLN
INTRAMUSCULAR | Status: AC
  Filled 2016-09-03: qty 20

## 2016-09-03 MED ORDER — ACETAMINOPHEN 650 MG RE SUPP
650.0000 mg | Freq: Once | RECTAL | Status: AC
  Administered 2016-09-03: 650 mg via RECTAL

## 2016-09-03 MED ORDER — PANTOPRAZOLE SODIUM 40 MG PO TBEC
40.0000 mg | DELAYED_RELEASE_TABLET | Freq: Every day | ORAL | Status: DC
  Administered 2016-09-05: 40 mg via ORAL
  Filled 2016-09-03: qty 1

## 2016-09-03 MED ORDER — PROTAMINE SULFATE 10 MG/ML IV SOLN
INTRAVENOUS | Status: AC
  Filled 2016-09-03: qty 25

## 2016-09-03 MED ORDER — SODIUM CHLORIDE 0.9 % IV SOLN
INTRAVENOUS | Status: DC
  Administered 2016-09-04: 1.8 [IU]/h via INTRAVENOUS
  Filled 2016-09-03 (×2): qty 1

## 2016-09-03 MED ORDER — METOPROLOL TARTRATE 5 MG/5ML IV SOLN: 2.5000 mg | INTRAVENOUS | Status: DC | PRN

## 2016-09-03 MED ORDER — METOPROLOL TARTRATE 25 MG/10 ML ORAL SUSPENSION: 12.5000 mg | Freq: Two times a day (BID) | ORAL | Status: DC

## 2016-09-03 MED ORDER — MIDAZOLAM HCL 2 MG/2ML IJ SOLN
2.0000 mg | INTRAMUSCULAR | Status: DC | PRN
  Administered 2016-09-03 – 2016-09-04 (×3): 2 mg via INTRAVENOUS
  Filled 2016-09-03 (×3): qty 2

## 2016-09-03 MED ORDER — PROPOFOL 10 MG/ML IV BOLUS
INTRAVENOUS | Status: AC
  Filled 2016-09-03: qty 20

## 2016-09-03 MED ORDER — ARTIFICIAL TEARS OPHTHALMIC OINT
TOPICAL_OINTMENT | OPHTHALMIC | Status: AC
  Filled 2016-09-03: qty 3.5

## 2016-09-03 MED ORDER — DEXTROSE 5 % IV SOLN
0.0000 ug/min | INTRAVENOUS | Status: DC
  Administered 2016-09-03: 10 ug/min via INTRAVENOUS
  Administered 2016-09-04: 17 ug/min via INTRAVENOUS
  Administered 2016-09-05 (×2): 25 ug/min via INTRAVENOUS
  Filled 2016-09-03 (×4): qty 16

## 2016-09-03 MED ORDER — ROCURONIUM BROMIDE 10 MG/ML (PF) SYRINGE
PREFILLED_SYRINGE | INTRAVENOUS | Status: AC
  Filled 2016-09-03: qty 10

## 2016-09-03 MED ORDER — PROTAMINE SULFATE 10 MG/ML IV SOLN
INTRAVENOUS | Status: AC
  Filled 2016-09-03: qty 10

## 2016-09-03 MED ORDER — CHLORHEXIDINE GLUCONATE 0.12 % MT SOLN
15.0000 mL | OROMUCOSAL | Status: AC
  Administered 2016-09-03: 15 mL via OROMUCOSAL

## 2016-09-03 MED ORDER — NITROGLYCERIN IN D5W 200-5 MCG/ML-% IV SOLN: 0.0000 ug/min | INTRAVENOUS | Status: DC

## 2016-09-03 MED ORDER — GELATIN ABSORBABLE MT POWD
OROMUCOSAL | Status: DC | PRN
  Administered 2016-09-03 (×3): 4 mL via TOPICAL

## 2016-09-03 MED ORDER — MORPHINE SULFATE (PF) 2 MG/ML IV SOLN: 2.0000 mg | INTRAVENOUS | Status: DC | PRN

## 2016-09-03 MED ORDER — ASPIRIN 81 MG PO CHEW
324.0000 mg | CHEWABLE_TABLET | Freq: Every day | ORAL | Status: DC
  Administered 2016-09-04: 324 mg
  Filled 2016-09-03: qty 4

## 2016-09-03 MED ORDER — HEMOSTATIC AGENTS (NO CHARGE) OPTIME
TOPICAL | Status: DC | PRN
  Administered 2016-09-03 (×2): 1 via TOPICAL

## 2016-09-03 MED ORDER — METOCLOPRAMIDE HCL 5 MG/ML IJ SOLN
10.0000 mg | Freq: Four times a day (QID) | INTRAMUSCULAR | Status: DC
  Administered 2016-09-03 – 2016-09-05 (×8): 10 mg via INTRAVENOUS
  Filled 2016-09-03 (×7): qty 2

## 2016-09-03 MED ORDER — DEXMEDETOMIDINE HCL 200 MCG/2ML IV SOLN
0.0000 ug/kg/h | INTRAVENOUS | Status: DC
  Administered 2016-09-04: 0.4 ug/kg/h via INTRAVENOUS
  Administered 2016-09-04: 0.5 ug/kg/h via INTRAVENOUS
  Filled 2016-09-03 (×4): qty 2

## 2016-09-03 MED ORDER — LACTATED RINGERS IV SOLN: INTRAVENOUS | Status: DC

## 2016-09-03 MED ORDER — LACTATED RINGERS IV SOLN: 500.0000 mL | Freq: Once | INTRAVENOUS | Status: DC | PRN

## 2016-09-03 MED ORDER — SODIUM CHLORIDE 0.9 % IV SOLN
0.0000 ug/min | INTRAVENOUS | Status: DC
  Filled 2016-09-03 (×2): qty 2

## 2016-09-03 MED ORDER — ASPIRIN EC 325 MG PO TBEC
325.0000 mg | DELAYED_RELEASE_TABLET | Freq: Every day | ORAL | Status: DC
  Administered 2016-09-05: 325 mg via ORAL
  Filled 2016-09-03: qty 1

## 2016-09-03 MED ORDER — CALCIUM CHLORIDE 10 % IV SOLN
1.0000 g | Freq: Once | INTRAVENOUS | Status: AC
  Administered 2016-09-03: 1 g via INTRAVENOUS

## 2016-09-03 MED ORDER — FAMOTIDINE IN NACL 20-0.9 MG/50ML-% IV SOLN
20.0000 mg | Freq: Two times a day (BID) | INTRAVENOUS | Status: AC
  Administered 2016-09-03 (×2): 20 mg via INTRAVENOUS
  Filled 2016-09-03: qty 50

## 2016-09-03 MED ORDER — VANCOMYCIN HCL IN DEXTROSE 1-5 GM/200ML-% IV SOLN
1000.0000 mg | INTRAVENOUS | Status: DC
  Administered 2016-09-04: 1000 mg via INTRAVENOUS
  Filled 2016-09-03: qty 200

## 2016-09-03 MED ORDER — LACTATED RINGERS IV SOLN
INTRAVENOUS | Status: DC | PRN
  Administered 2016-09-03 (×2): via INTRAVENOUS

## 2016-09-03 MED ORDER — DOPAMINE-DEXTROSE 3.2-5 MG/ML-% IV SOLN
0.0000 ug/kg/min | INTRAVENOUS | Status: DC
  Administered 2016-09-04 – 2016-09-05 (×2): 5 ug/kg/min via INTRAVENOUS
  Filled 2016-09-03 (×2): qty 250

## 2016-09-03 MED ORDER — MILRINONE LACTATE IN DEXTROSE 20-5 MG/100ML-% IV SOLN
0.1250 ug/kg/min | INTRAVENOUS | Status: AC
  Administered 2016-09-03: .3 ug/kg/min via INTRAVENOUS
  Filled 2016-09-03: qty 100

## 2016-09-03 MED ORDER — ALBUMIN HUMAN 5 % IV SOLN
250.0000 mL | INTRAVENOUS | Status: AC | PRN
  Administered 2016-09-03 (×5): 250 mL via INTRAVENOUS
  Filled 2016-09-03 (×3): qty 250

## 2016-09-03 MED ORDER — MORPHINE SULFATE (PF) 4 MG/ML IV SOLN: 1.0000 mg | INTRAVENOUS | Status: AC | PRN

## 2016-09-03 MED ORDER — SODIUM CHLORIDE 0.9% FLUSH: 3.0000 mL | INTRAVENOUS | Status: DC | PRN

## 2016-09-03 MED ORDER — ACETAMINOPHEN 500 MG PO TABS
1000.0000 mg | ORAL_TABLET | Freq: Four times a day (QID) | ORAL | Status: DC
  Administered 2016-09-05: 1000 mg via ORAL
  Filled 2016-09-03 (×2): qty 2

## 2016-09-03 MED ORDER — SODIUM CHLORIDE 0.9 % IV SOLN
250.0000 mL | INTRAVENOUS | Status: DC
  Administered 2016-09-04: 250 mL via INTRAVENOUS

## 2016-09-03 MED ORDER — SODIUM CHLORIDE 0.45 % IV SOLN
INTRAVENOUS | Status: DC | PRN
  Administered 2016-09-03: 16:00:00 via INTRAVENOUS

## 2016-09-03 MED ORDER — MAGNESIUM SULFATE 4 GM/100ML IV SOLN
4.0000 g | Freq: Once | INTRAVENOUS | Status: AC
  Administered 2016-09-03: 4 g via INTRAVENOUS
  Filled 2016-09-03: qty 100

## 2016-09-03 MED ORDER — DEXTROSE 50 % IV SOLN
12.0000 mL | Freq: Once | INTRAVENOUS | Status: AC
  Administered 2016-09-03: 12 mL via INTRAVENOUS

## 2016-09-03 MED ORDER — SODIUM CHLORIDE 0.9 % IJ SOLN
INTRAMUSCULAR | Status: AC
  Filled 2016-09-03: qty 10

## 2016-09-03 MED ORDER — DEXTROSE 50 % IV SOLN
INTRAVENOUS | Status: AC
  Administered 2016-09-03: 07:00:00
  Filled 2016-09-03: qty 50

## 2016-09-03 MED ORDER — SODIUM CHLORIDE 0.9% FLUSH
3.0000 mL | Freq: Two times a day (BID) | INTRAVENOUS | Status: DC
  Administered 2016-09-04 – 2016-09-05 (×3): 3 mL via INTRAVENOUS

## 2016-09-03 MED ORDER — PROTAMINE SULFATE 10 MG/ML IV SOLN
INTRAVENOUS | Status: DC | PRN
  Administered 2016-09-03 (×2): 50 mg via INTRAVENOUS
  Administered 2016-09-03: 20 mg via INTRAVENOUS
  Administered 2016-09-03: 40 mg via INTRAVENOUS
  Administered 2016-09-03: 50 mg via INTRAVENOUS
  Administered 2016-09-03: 20 mg via INTRAVENOUS
  Administered 2016-09-03: 50 mg via INTRAVENOUS
  Administered 2016-09-03: 20 mg via INTRAVENOUS

## 2016-09-03 MED ORDER — LACTATED RINGERS IV SOLN
INTRAVENOUS | Status: DC | PRN
  Administered 2016-09-03: 09:00:00 via INTRAVENOUS

## 2016-09-03 MED ORDER — ACETAMINOPHEN 160 MG/5ML PO SOLN
1000.0000 mg | Freq: Four times a day (QID) | ORAL | Status: DC
  Administered 2016-09-03 – 2016-09-04 (×2): 1000 mg
  Filled 2016-09-03 (×2): qty 40.6

## 2016-09-03 MED ORDER — LACTULOSE 10 GM/15ML PO SOLN
10.0000 g | Freq: Two times a day (BID) | ORAL | Status: DC
  Administered 2016-09-04 – 2016-09-05 (×2): 10 g via ORAL
  Filled 2016-09-03 (×3): qty 15

## 2016-09-03 MED ORDER — EZETIMIBE 10 MG PO TABS
10.0000 mg | ORAL_TABLET | Freq: Every day | ORAL | Status: DC
  Administered 2016-09-04 – 2016-09-05 (×2): 10 mg via ORAL
  Filled 2016-09-03 (×2): qty 1

## 2016-09-03 MED ORDER — HEPARIN SODIUM (PORCINE) 1000 UNIT/ML IJ SOLN
INTRAMUSCULAR | Status: AC
  Filled 2016-09-03: qty 1

## 2016-09-03 MED ORDER — MIDAZOLAM HCL 2 MG/2ML IJ SOLN
INTRAMUSCULAR | Status: AC
  Filled 2016-09-03: qty 2

## 2016-09-03 MED ORDER — FENTANYL CITRATE (PF) 250 MCG/5ML IJ SOLN
INTRAMUSCULAR | Status: DC | PRN
  Administered 2016-09-03: 150 ug via INTRAVENOUS
  Administered 2016-09-03 (×2): 25 ug via INTRAVENOUS
  Administered 2016-09-03: 100 ug via INTRAVENOUS
  Administered 2016-09-03: 150 ug via INTRAVENOUS
  Administered 2016-09-03: 25 ug via INTRAVENOUS
  Administered 2016-09-03 (×2): 250 ug via INTRAVENOUS
  Administered 2016-09-03: 150 ug via INTRAVENOUS
  Administered 2016-09-03: 100 ug via INTRAVENOUS
  Administered 2016-09-03: 25 ug via INTRAVENOUS

## 2016-09-03 MED ORDER — VANCOMYCIN HCL IN DEXTROSE 1-5 GM/200ML-% IV SOLN
1000.0000 mg | Freq: Once | INTRAVENOUS | Status: DC
  Filled 2016-09-03: qty 200

## 2016-09-03 MED ORDER — MIDAZOLAM HCL 5 MG/5ML IJ SOLN
INTRAMUSCULAR | Status: DC | PRN
  Administered 2016-09-03: 1 mg via INTRAVENOUS
  Administered 2016-09-03: 2 mg via INTRAVENOUS
  Administered 2016-09-03: 1 mg via INTRAVENOUS
  Administered 2016-09-03: .5 mg via INTRAVENOUS
  Administered 2016-09-03: 2 mg via INTRAVENOUS
  Administered 2016-09-03: .5 mg via INTRAVENOUS

## 2016-09-03 MED ORDER — ARTIFICIAL TEARS OPHTHALMIC OINT
TOPICAL_OINTMENT | OPHTHALMIC | Status: DC | PRN
  Administered 2016-09-03: 1 via OPHTHALMIC

## 2016-09-03 MED ORDER — BISACODYL 10 MG RE SUPP: 10.0000 mg | Freq: Every day | RECTAL | Status: DC

## 2016-09-03 MED ORDER — ALBUMIN HUMAN 5 % IV SOLN
INTRAVENOUS | Status: DC | PRN
  Administered 2016-09-03: 15:00:00 via INTRAVENOUS

## 2016-09-03 MED ORDER — MIDAZOLAM HCL 10 MG/2ML IJ SOLN
INTRAMUSCULAR | Status: AC
  Filled 2016-09-03: qty 2

## 2016-09-03 MED ORDER — LIDOCAINE HCL (PF) 1 % IJ SOLN
INTRAMUSCULAR | Status: AC
  Filled 2016-09-03: qty 30

## 2016-09-03 MED ORDER — ONDANSETRON HCL 4 MG/2ML IJ SOLN
4.0000 mg | Freq: Four times a day (QID) | INTRAMUSCULAR | Status: DC | PRN
  Administered 2016-09-04: 4 mg via INTRAVENOUS
  Filled 2016-09-03: qty 2

## 2016-09-03 MED ORDER — MILRINONE LACTATE IN DEXTROSE 20-5 MG/100ML-% IV SOLN
0.3000 ug/kg/min | INTRAVENOUS | Status: DC
  Administered 2016-09-03 – 2016-09-05 (×4): 0.3 ug/kg/min via INTRAVENOUS
  Filled 2016-09-03 (×4): qty 100

## 2016-09-03 MED ORDER — SODIUM CHLORIDE 0.9 % IV SOLN
INTRAVENOUS | Status: DC
  Administered 2016-09-04: 10 mL/h via INTRAVENOUS

## 2016-09-03 MED ORDER — METOPROLOL TARTRATE 12.5 MG HALF TABLET: 12.5000 mg | ORAL_TABLET | Freq: Two times a day (BID) | ORAL | Status: DC

## 2016-09-03 MED ORDER — LACTATED RINGERS IV SOLN
INTRAVENOUS | Status: DC | PRN
  Administered 2016-09-03: 08:00:00 via INTRAVENOUS

## 2016-09-03 MED ORDER — FENTANYL CITRATE (PF) 250 MCG/5ML IJ SOLN
INTRAMUSCULAR | Status: AC
  Filled 2016-09-03: qty 5

## 2016-09-03 MED ORDER — MORPHINE SULFATE (PF) 2 MG/ML IV SOLN: 1.0000 mg | INTRAVENOUS | Status: DC | PRN

## 2016-09-03 MED ORDER — MORPHINE SULFATE (PF) 4 MG/ML IV SOLN: 2.0000 mg | INTRAVENOUS | Status: DC | PRN

## 2016-09-03 MED ORDER — BISACODYL 5 MG PO TBEC
10.0000 mg | DELAYED_RELEASE_TABLET | Freq: Every day | ORAL | Status: DC
  Administered 2016-09-05: 10 mg via ORAL
  Filled 2016-09-03: qty 2

## 2016-09-03 MED ORDER — DEXTROSE 50 % IV SOLN
INTRAVENOUS | Status: DC | PRN
  Administered 2016-09-03: 12.5 g via INTRAVENOUS
  Administered 2016-09-03 (×3): 6.25 g via INTRAVENOUS

## 2016-09-03 MED ORDER — TRAMADOL HCL 50 MG PO TABS: 50.0000 mg | ORAL_TABLET | ORAL | Status: DC | PRN

## 2016-09-03 MED ORDER — DEXTROSE 5 % IV SOLN
1.5000 g | Freq: Two times a day (BID) | INTRAVENOUS | Status: AC
  Administered 2016-09-03 – 2016-09-05 (×4): 1.5 g via INTRAVENOUS
  Filled 2016-09-03 (×4): qty 1.5

## 2016-09-03 MED ORDER — ACETAMINOPHEN 160 MG/5ML PO SOLN: 650.0000 mg | Freq: Once | ORAL | Status: AC

## 2016-09-03 MED FILL — Potassium Chloride Inj 2 mEq/ML: INTRAVENOUS | Qty: 10 | Status: AC

## 2016-09-03 MED FILL — Magnesium Sulfate Inj 50%: INTRAMUSCULAR | Qty: 10 | Status: AC

## 2016-09-03 MED FILL — Heparin Sodium (Porcine) Inj 1000 Unit/ML: INTRAMUSCULAR | Qty: 30 | Status: AC

## 2016-09-03 SURGICAL SUPPLY — 115 items
ADAPTER CARDIO PERF ANTE/RETRO (ADAPTER) ×4 IMPLANT
ADH SKN CLS APL DERMABOND .7 (GAUZE/BANDAGES/DRESSINGS) ×2
ADPR PRFSN 84XANTGRD RTRGD (ADAPTER) ×2
AGENT HMST KT MTR STRL THRMB (HEMOSTASIS) ×2
BAG DECANTER FOR FLEXI CONT (MISCELLANEOUS) ×4 IMPLANT
BANDAGE ACE 4X5 VEL STRL LF (GAUZE/BANDAGES/DRESSINGS) ×4 IMPLANT
BANDAGE ACE 6X5 VEL STRL LF (GAUZE/BANDAGES/DRESSINGS) ×4 IMPLANT
BASKET HEART  (ORDER IN 25'S) (MISCELLANEOUS) ×1
BASKET HEART (ORDER IN 25'S) (MISCELLANEOUS) ×1
BASKET HEART (ORDER IN 25S) (MISCELLANEOUS) ×2 IMPLANT
BLADE CLIPPER SURG (BLADE) IMPLANT
BLADE STERNUM SYSTEM 6 (BLADE) ×4 IMPLANT
BLADE SURG 11 STRL SS (BLADE) ×3 IMPLANT
BLADE SURG 12 STRL SS (BLADE) ×4 IMPLANT
BNDG GAUZE ELAST 4 BULKY (GAUZE/BANDAGES/DRESSINGS) ×4 IMPLANT
CANISTER SUCT 3000ML PPV (MISCELLANEOUS) ×4 IMPLANT
CANNULA GUNDRY RCSP 15FR (MISCELLANEOUS) ×4 IMPLANT
CATH CPB KIT VANTRIGT (MISCELLANEOUS) ×4 IMPLANT
CATH ROBINSON RED A/P 18FR (CATHETERS) ×12 IMPLANT
CATH THORACIC 36FR RT ANG (CATHETERS) ×4 IMPLANT
CLIP RETRACTION 3.0MM CORONARY (MISCELLANEOUS) ×2 IMPLANT
CLIP TI WIDE RED SMALL 24 (CLIP) ×2 IMPLANT
CRADLE DONUT ADULT HEAD (MISCELLANEOUS) ×4 IMPLANT
DERMABOND ADVANCED (GAUZE/BANDAGES/DRESSINGS) ×2
DERMABOND ADVANCED .7 DNX12 (GAUZE/BANDAGES/DRESSINGS) IMPLANT
DRAIN CHANNEL 32F RND 10.7 FF (WOUND CARE) ×4 IMPLANT
DRAPE CARDIOVASCULAR INCISE (DRAPES) ×4
DRAPE SLUSH/WARMER DISC (DRAPES) ×4 IMPLANT
DRAPE SRG 135X102X78XABS (DRAPES) ×2 IMPLANT
DRSG AQUACEL AG ADV 3.5X14 (GAUZE/BANDAGES/DRESSINGS) ×4 IMPLANT
ELECT BLADE 4.0 EZ CLEAN MEGAD (MISCELLANEOUS) ×4
ELECT BLADE 6.5 EXT (BLADE) ×6 IMPLANT
ELECT CAUTERY BLADE 6.4 (BLADE) ×4 IMPLANT
ELECT REM PT RETURN 9FT ADLT (ELECTROSURGICAL) ×8
ELECTRODE BLDE 4.0 EZ CLN MEGD (MISCELLANEOUS) ×2 IMPLANT
ELECTRODE REM PT RTRN 9FT ADLT (ELECTROSURGICAL) ×4 IMPLANT
FELT TEFLON 1X6 (MISCELLANEOUS) ×8 IMPLANT
GAUZE SPONGE 4X4 12PLY STRL (GAUZE/BANDAGES/DRESSINGS) ×8 IMPLANT
GLOVE BIO SURGEON STRL SZ 6.5 (GLOVE) ×4 IMPLANT
GLOVE BIO SURGEON STRL SZ7.5 (GLOVE) ×12 IMPLANT
GLOVE BIO SURGEONS STRL SZ 6.5 (GLOVE) ×4
GLOVE BIOGEL PI IND STRL 6 (GLOVE) ×8 IMPLANT
GLOVE BIOGEL PI IND STRL 6.5 (GLOVE) IMPLANT
GLOVE BIOGEL PI INDICATOR 6 (GLOVE) ×8
GLOVE BIOGEL PI INDICATOR 6.5 (GLOVE) ×6
GOWN STRL REUS W/ TWL LRG LVL3 (GOWN DISPOSABLE) ×8 IMPLANT
GOWN STRL REUS W/TWL LRG LVL3 (GOWN DISPOSABLE) ×16
HEMOSTAT POWDER SURGIFOAM 1G (HEMOSTASIS) ×12 IMPLANT
HEMOSTAT SURGICEL 2X14 (HEMOSTASIS) ×4 IMPLANT
INSERT FOGARTY XLG (MISCELLANEOUS) IMPLANT
KIT BASIN OR (CUSTOM PROCEDURE TRAY) ×4 IMPLANT
KIT ROOM TURNOVER OR (KITS) ×4 IMPLANT
KIT SUCTION CATH 14FR (SUCTIONS) ×4 IMPLANT
KIT VASOVIEW HEMOPRO VH 3000 (KITS) ×4 IMPLANT
LEAD PACING MYOCARDI (MISCELLANEOUS) ×4 IMPLANT
MARKER GRAFT CORONARY BYPASS (MISCELLANEOUS) ×12 IMPLANT
NDL HYPO 25GX1X1/2 BEV (NEEDLE) IMPLANT
NDL SUT 4 .5 CRC FRENCH EYE (NEEDLE) IMPLANT
NEEDLE FRENCH EYE (NEEDLE) ×4
NEEDLE HYPO 25GX1X1/2 BEV (NEEDLE) ×4 IMPLANT
NS IRRIG 1000ML POUR BTL (IV SOLUTION) ×20 IMPLANT
PACK OPEN HEART (CUSTOM PROCEDURE TRAY) ×4 IMPLANT
PAD ARMBOARD 7.5X6 YLW CONV (MISCELLANEOUS) ×8 IMPLANT
PAD ELECT DEFIB RADIOL ZOLL (MISCELLANEOUS) ×4 IMPLANT
PENCIL BUTTON HOLSTER BLD 10FT (ELECTRODE) ×4 IMPLANT
PUNCH AORTIC ROTATE  4.5MM 8IN (MISCELLANEOUS) ×4 IMPLANT
PUNCH AORTIC ROTATE 4.0MM (MISCELLANEOUS) IMPLANT
PUNCH AORTIC ROTATE 4.5MM 8IN (MISCELLANEOUS) IMPLANT
PUNCH AORTIC ROTATE 5MM 8IN (MISCELLANEOUS) IMPLANT
SET CARDIOPLEGIA MPS 5001102 (MISCELLANEOUS) ×2 IMPLANT
SPONGE LAP 18X18 X RAY DECT (DISPOSABLE) ×12 IMPLANT
SPONGE LAP 4X18 X RAY DECT (DISPOSABLE) ×2 IMPLANT
STOPCOCK 4 WAY LG BORE MALE ST (IV SETS) ×2 IMPLANT
SURGIFLO W/THROMBIN 8M KIT (HEMOSTASIS) ×6 IMPLANT
SUT BONE WAX W31G (SUTURE) ×4 IMPLANT
SUT MNCRL AB 4-0 PS2 18 (SUTURE) ×4 IMPLANT
SUT PROLENE 3 0 SH DA (SUTURE) IMPLANT
SUT PROLENE 3 0 SH1 36 (SUTURE) IMPLANT
SUT PROLENE 4 0 RB 1 (SUTURE) ×4
SUT PROLENE 4 0 SH DA (SUTURE) ×4 IMPLANT
SUT PROLENE 4-0 RB1 .5 CRCL 36 (SUTURE) ×2 IMPLANT
SUT PROLENE 5 0 C 1 36 (SUTURE) IMPLANT
SUT PROLENE 6 0 C 1 30 (SUTURE) ×6 IMPLANT
SUT PROLENE 6 0 CC (SUTURE) ×12 IMPLANT
SUT PROLENE 8 0 BV175 6 (SUTURE) IMPLANT
SUT PROLENE BLUE 7 0 (SUTURE) ×4 IMPLANT
SUT PROLENE POLY MONO (SUTURE) ×2 IMPLANT
SUT SILK  1 MH (SUTURE)
SUT SILK 1 MH (SUTURE) IMPLANT
SUT SILK 2 0 SH CR/8 (SUTURE) ×4 IMPLANT
SUT SILK 3 0 SH CR/8 (SUTURE) IMPLANT
SUT STEEL 6MS V (SUTURE) ×4 IMPLANT
SUT STEEL SZ 6 DBL 3X14 BALL (SUTURE) ×6 IMPLANT
SUT VIC AB 1 CTX 36 (SUTURE) ×12
SUT VIC AB 1 CTX36XBRD ANBCTR (SUTURE) ×4 IMPLANT
SUT VIC AB 2-0 CT1 27 (SUTURE) ×8
SUT VIC AB 2-0 CT1 TAPERPNT 27 (SUTURE) IMPLANT
SUT VIC AB 2-0 CTX 27 (SUTURE) ×2 IMPLANT
SUT VIC AB 3-0 X1 27 (SUTURE) IMPLANT
SUTURE E-PAK OPEN HEART (SUTURE) ×4 IMPLANT
SYR CONTROL 10ML LL (SYRINGE) ×2 IMPLANT
SYSTEM SAHARA CHEST DRAIN ATS (WOUND CARE) ×4 IMPLANT
TOWEL GREEN STERILE (TOWEL DISPOSABLE) ×16 IMPLANT
TOWEL GREEN STERILE FF (TOWEL DISPOSABLE) ×8 IMPLANT
TOWEL OR 17X24 6PK STRL BLUE (TOWEL DISPOSABLE) ×8 IMPLANT
TOWEL OR 17X26 10 PK STRL BLUE (TOWEL DISPOSABLE) ×8 IMPLANT
TRAY CATH LUMEN 1 20CM STRL (SET/KITS/TRAYS/PACK) ×2 IMPLANT
TRAY FOLEY SILVER 16FR TEMP (SET/KITS/TRAYS/PACK) ×4 IMPLANT
TUBE CONNECTING 12'X1/4 (SUCTIONS) ×1
TUBE CONNECTING 12X1/4 (SUCTIONS) ×1 IMPLANT
TUBING ART PRESS 48 MALE/FEM (TUBING) ×6 IMPLANT
TUBING INSUFFLATION (TUBING) ×4 IMPLANT
UNDERPAD 30X30 (UNDERPADS AND DIAPERS) ×4 IMPLANT
WATER STERILE IRR 1000ML POUR (IV SOLUTION) ×8 IMPLANT
YANKAUER SUCT BULB TIP NO VENT (SUCTIONS) ×3 IMPLANT

## 2016-09-03 NOTE — Progress Notes (Signed)
Hypoglycemic Event  CBG: 70  Treatment: D50 77ml per glucose stabilizer  Symptoms: None  Follow-up CBG: Time:1915 CBG Result:96  Possible Reasons for Event: Unknown      Therisa Doyne

## 2016-09-03 NOTE — Progress Notes (Signed)
MD made aware BP trending down. Orders to change neo to levo.

## 2016-09-03 NOTE — Brief Op Note (Addendum)
08/28/2016 - 09/01/2016  1:26 PM  PATIENT:  Jillian Willis  76 y.o. female  PRE-OPERATIVE DIAGNOSIS:  1. Ischemic cardiomyopathy 2.CAD  POST-OPERATIVE DIAGNOSIS: 1. Ischemic cardiomyopathy 2.CAD  PROCEDURE:  TRANSESOPHAGEAL ECHOCARDIOGRAM (TEE), MEDIAN STERNOTOMY for CORONARY ARTERY BYPASS GRAFTING (CABG)x 3 ON PUMP, TIMES THREE, (LIMA to LAD, SVG to RAMUS INTERMEDIATE, SVG to RCA) USING LEFT INTERNAL MAMMARY ARTERY AND ENDOSCOPICALLY HARVESTED LEFT GREATER SAPHENOUS VEIN, RIGHT FEMORAL ARTERIAL LINE  SURGEON:  Surgeon(s) and Role:    Ivin Poot, MD - Primary  PHYSICIAN ASSISTANT: Lars Pinks PA-C  ASSISTANTS: Dineen Kid RNFA  ANESTHESIA:   general  EBL:  Total I/O In: 4656 [I.V.:1020] Out: 840 [Urine:840]  BLOOD ADMINISTERED:Two CC PRBC, two FFP and one PLTS  DRAINS: Chest tubes placed in the mediastinal and pleural spaces   COUNTS CORRECT:  YES  DICTATION: .Dragon Dictation  PLAN OF CARE: Admit to inpatient   PATIENT DISPOSITION:  ICU - intubated and hemodynamically stable.   Delay start of Pharmacological VTE agent (>24hrs) due to surgical blood loss or risk of bleeding: yes  BASELINE WEIGHT: 89 kg

## 2016-09-03 NOTE — Anesthesia Procedure Notes (Signed)
Central Venous Catheter Insertion Performed by: Roberts Gaudy, anesthesiologist Start/End06/04/2016 7:05 AM, 09/03/2016 7:10 AM Patient location: Pre-op. Preanesthetic checklist: patient identified, IV checked, site marked, risks and benefits discussed, surgical consent, monitors and equipment checked, pre-op evaluation and timeout performed Position: supine Hand hygiene performed , maximum sterile barriers used  and Seldinger technique used Catheter size: 8.5 Fr PA cath was placed.Sheath introducer Procedure performed using ultrasound guided technique. Ultrasound Notes:anatomy identified and needle tip was noted to be adjacent to the nerve/plexus identified Attempts: 1 Following insertion, line sutured and dressing applied. Post procedure assessment: blood return through all ports and free fluid flow  Patient tolerated the procedure well with no immediate complications.

## 2016-09-03 NOTE — Progress Notes (Signed)
MD made aware MAP <55 despite per protocol interventions. Verbal orders given to add Ca 1g to albumin.

## 2016-09-03 NOTE — Anesthesia Procedure Notes (Signed)
Procedure Name: Intubation Date/Time: 09/16/2016 9:46 AM Performed by: Suzy Bouchard Pre-anesthesia Checklist: Patient identified, Emergency Drugs available, Suction available, Patient being monitored and Timeout performed Patient Re-evaluated:Patient Re-evaluated prior to inductionOxygen Delivery Method: Circle system utilized Preoxygenation: Pre-oxygenation with 100% oxygen Intubation Type: IV induction Ventilation: Mask ventilation without difficulty and Oral airway inserted - appropriate to patient size Laryngoscope Size: Sabra Heck and 2 Grade View: Grade I Tube type: Oral Tube size: 8.0 mm Number of attempts: 1 Airway Equipment and Method: Stylet Placement Confirmation: ETT inserted through vocal cords under direct vision and breath sounds checked- equal and bilateral Secured at: 19 cm Tube secured with: Tape Dental Injury: Teeth and Oropharynx as per pre-operative assessment

## 2016-09-03 NOTE — Anesthesia Procedure Notes (Signed)
Central Venous Catheter Insertion Performed by: Roberts Gaudy, anesthesiologist Start/End06/29/2018 7:00 AM, 09/03/2016 7:05 AM Patient location: Pre-op. Preanesthetic checklist: patient identified, IV checked, site marked, risks and benefits discussed, surgical consent, monitors and equipment checked, pre-op evaluation and timeout performed Position: supine Hand hygiene performed , maximum sterile barriers used  and Seldinger technique used Catheter size: 8 Fr Central line was placed.Double lumen Procedure performed using ultrasound guided technique. Ultrasound Notes:anatomy identified, needle tip was noted to be adjacent to the nerve/plexus identified and no ultrasound evidence of intravascular and/or intraneural injection Attempts: 1 Following insertion, line sutured, dressing applied and Biopatch. Post procedure assessment: blood return through all ports, free fluid flow and no air  Patient tolerated the procedure well with no immediate complications.

## 2016-09-03 NOTE — Progress Notes (Signed)
      [address]       [place] [address]             [phone]      S/p CABG  Intubated,sedated  CI=2.1 on milrinone and dopamine Neo dose escalating  BP (!) 90/54   Pulse 96   Temp (!) 95.2 F (35.1 C)   Resp 12   Wt 197 lb 3.2 oz (89.4 kg)   SpO2 100%   BMI 36.07 kg/m    Intake/Output Summary (Last 24 hours) at 09/11/2016 1741 Last data filed at 09/19/2016 1729  Gross per 24 hour  Intake          4134.75 ml  Output             3858 ml  Net           276.75 ml    Hct= 31  Overall doing well. Systemic systolic pressure relatively low- will give additional albumin as PAD still relatively low and also calcium  Remo Lipps C. Roxan Hockey, MD [facility] [phone]

## 2016-09-03 NOTE — OR Nursing (Signed)
Twenty minute call to SICU charge nurse at 1441. Spoke to [employer].

## 2016-09-03 NOTE — Transfer of Care (Addendum)
Immediate Anesthesia Transfer of Care Note  Patient: Jillian Willis  Procedure(s) Performed: Procedure(s) with comments: CORONARY ARTERY BYPASS GRAFTING (CABG), ON PUMP, TIMES THREE, USING LEFT INTERNAL MAMMARY ARTERY AND ENDOSCOPICALLY HARVESTED LEFT GREATER SAPHENOUS VEIN (N/A) - LIMA to LAD, SVG to RAMUS INTERMEDIATE, SVG to RCA TRANSESOPHAGEAL ECHOCARDIOGRAM (TEE) (N/A)  Patient Location: ICU  Anesthesia Type:General  Level of Consciousness: sedated and unresponsive  Airway & Oxygen Therapy: Patient remains intubated per anesthesia plan and Patient placed on Ventilator (see vital sign flow sheet for setting)  Post-op Assessment: Report given to RN and Post -op Vital signs reviewed and stable  Post vital signs: Reviewed and stable  Last Vitals:  Vitals:   08/24/2016 0324 09/18/2016 1536  BP: 118/74   Pulse: 66 96  Resp: 18   Temp: 36.6 C     Last Pain:  Vitals:   09/11/2016 0324  TempSrc: Oral         Complications: No apparent anesthesia complications

## 2016-09-03 NOTE — Progress Notes (Signed)
Pt's blood sugar was 37 administered dextrose 50% solution. Rechecked after 15 mins and blood sugar is 183. Will continue to monitor.

## 2016-09-03 NOTE — Anesthesia Procedure Notes (Signed)
Procedures

## 2016-09-03 NOTE — Anesthesia Preprocedure Evaluation (Addendum)
Anesthesia Evaluation  Patient identified by MRN, date of birth, ID band Patient awake    Reviewed: Allergy & Precautions, NPO status , Patient's Chart, lab work & pertinent test results, Unable to perform ROS - Chart review only  Airway Mallampati: II  TM Distance: >3 FB Neck ROM: Full   Comment: Narrow palate Dental  (+) Edentulous Upper, Edentulous Lower   Pulmonary sleep apnea (patient states she no longer wears CPAP since losing weight) , Current Smoker (patient denies being a smoker),  Patient states breathing is much improved after diuresis.  Short of breath with moving self up in bed.   breath sounds clear to auscultation + decreased breath sounds      Cardiovascular hypertension, Pt. on home beta blockers + CAD, + Past MI and +CHF   Rhythm:Regular Rate:Normal  EF around 20%   Neuro/Psych    GI/Hepatic GERD  ,  Endo/Other  diabetes, Type 2, Insulin Dependent, Oral Hypoglycemic AgentsHypothyroidism   Renal/GU Renal InsufficiencyRenal disease     Musculoskeletal  (+) Arthritis ,   Abdominal (+) + obese,   Peds  Hematology   Anesthesia Other Findings   Reproductive/Obstetrics                           Anesthesia Physical Anesthesia Plan  ASA: IV  Anesthesia Plan: General   Post-op Pain Management:    Induction: Intravenous  PONV Risk Score and Plan: 1 and 2 and Ondansetron and Dexamethasone  Airway Management Planned: Oral ETT  Additional Equipment: PA Cath, 3D TEE, CVP, Arterial line and Ultrasound Guidance Line Placement  Intra-op Plan:   Post-operative Plan: Post-operative intubation/ventilation  Informed Consent: I have reviewed the patients History and Physical, chart, labs and discussed the procedure including the risks, benefits and alternatives for the proposed anesthesia with the patient or authorized representative who has indicated his/her understanding and  acceptance.     Plan Discussed with: CRNA and Anesthesiologist  Anesthesia Plan Comments:         Anesthesia Quick Evaluation

## 2016-09-03 NOTE — Progress Notes (Signed)
The patient was examined and preop studies reviewed. There has been no change from the prior exam and the patient is ready for surgery.  plan CABG on [place]

## 2016-09-03 NOTE — OR Nursing (Signed)
Forty-five minute call to SICU charge nurse at 1407. Spoke to [employer].

## 2016-09-03 NOTE — Anesthesia Postprocedure Evaluation (Signed)
Anesthesia Post Note  Patient: Jillian Willis  Procedure(s) Performed: Procedure(s) (LRB): CORONARY ARTERY BYPASS GRAFTING (CABG), ON PUMP, TIMES THREE, USING LEFT INTERNAL MAMMARY ARTERY AND ENDOSCOPICALLY HARVESTED LEFT GREATER SAPHENOUS VEIN (N/A) TRANSESOPHAGEAL ECHOCARDIOGRAM (TEE) (N/A)     Patient location during evaluation: PACU Anesthesia Type: General Level of consciousness: awake, awake and alert and oriented Pain management: pain level controlled Vital Signs Assessment: post-procedure vital signs reviewed and stable Respiratory status: spontaneous breathing, nonlabored ventilation and respiratory function stable Cardiovascular status: blood pressure returned to baseline Anesthetic complications: no    Last Vitals:  Vitals:   08/29/2016 1642 09/15/2016 1645  BP:    Pulse:    Resp:    Temp: (!) 35.2 C (!) 35.2 C    Last Pain:  Vitals:   08/27/2016 0324  TempSrc: Oral                 Verbena Boeding COKER

## 2016-09-04 ENCOUNTER — Encounter (HOSPITAL_COMMUNITY): Payer: Self-pay | Admitting: Cardiothoracic Surgery

## 2016-09-04 ENCOUNTER — Other Ambulatory Visit: Payer: Self-pay

## 2016-09-04 ENCOUNTER — Inpatient Hospital Stay (HOSPITAL_COMMUNITY): Payer: Medicare Other

## 2016-09-04 LAB — POCT I-STAT, CHEM 8
BUN: 41 mg/dL — ABNORMAL HIGH (ref 6–20)
Calcium, Ion: 1.23 mmol/L (ref 1.15–1.40)
Chloride: 104 mmol/L (ref 101–111)
Creatinine, Ser: 2.3 mg/dL — ABNORMAL HIGH (ref 0.44–1.00)
Glucose, Bld: 132 mg/dL — ABNORMAL HIGH (ref 65–99)
HCT: 25 % — ABNORMAL LOW (ref 36.0–46.0)
Hemoglobin: 8.5 g/dL — ABNORMAL LOW (ref 12.0–15.0)
Potassium: 4.5 mmol/L (ref 3.5–5.1)
Sodium: 137 mmol/L (ref 135–145)
TCO2: 23 mmol/L (ref 0–100)

## 2016-09-04 LAB — GLUCOSE, CAPILLARY
Glucose-Capillary: 132 mg/dL — ABNORMAL HIGH (ref 65–99)
Glucose-Capillary: 138 mg/dL — ABNORMAL HIGH (ref 65–99)
Glucose-Capillary: 140 mg/dL — ABNORMAL HIGH (ref 65–99)
Glucose-Capillary: 104 mg/dL — ABNORMAL HIGH (ref 65–99)
Glucose-Capillary: 105 mg/dL — ABNORMAL HIGH (ref 65–99)
Glucose-Capillary: 126 mg/dL — ABNORMAL HIGH (ref 65–99)
Glucose-Capillary: 110 mg/dL — ABNORMAL HIGH (ref 65–99)
Glucose-Capillary: 81 mg/dL (ref 65–99)
Glucose-Capillary: 125 mg/dL — ABNORMAL HIGH (ref 65–99)
Glucose-Capillary: 121 mg/dL — ABNORMAL HIGH (ref 65–99)
Glucose-Capillary: 118 mg/dL — ABNORMAL HIGH (ref 65–99)
Glucose-Capillary: 187 mg/dL — ABNORMAL HIGH (ref 65–99)
Glucose-Capillary: 228 mg/dL — ABNORMAL HIGH (ref 65–99)

## 2016-09-04 LAB — BASIC METABOLIC PANEL
ANION GAP: 10 (ref 5–15)
BUN: 45 mg/dL — AB (ref 6–20)
CO2: 21 mmol/L — ABNORMAL LOW (ref 22–32)
Calcium: 9 mg/dL (ref 8.9–10.3)
Chloride: 107 mmol/L (ref 101–111)
Creatinine, Ser: 2.13 mg/dL — ABNORMAL HIGH (ref 0.44–1.00)
GFR calc Af Amer: 25 mL/min — ABNORMAL LOW (ref >60)
GFR, EST NON AFRICAN AMERICAN: 22 mL/min — AB (ref >60)
Glucose, Bld: 122 mg/dL — ABNORMAL HIGH (ref 65–99)
POTASSIUM: 3.7 mmol/L (ref 3.5–5.1)
SODIUM: 138 mmol/L (ref 135–145)

## 2016-09-04 LAB — PREPARE RBC (CROSSMATCH)

## 2016-09-04 LAB — BPAM FFP
BLOOD PRODUCT EXPIRATION DATE: 201806182359
Blood Product Expiration Date: 201806182359
ISSUE DATE / TIME: 201806131355
ISSUE DATE / TIME: 201806131355
UNIT TYPE AND RH: 8400
Unit Type and Rh: 8400

## 2016-09-04 LAB — COOXEMETRY PANEL
Carboxyhemoglobin: 1.5 % (ref 0.5–1.5)
Methemoglobin: 0.9 % (ref 0.0–1.5)
O2 Saturation: 68.9 %
Total hemoglobin: 9.1 g/dL — ABNORMAL LOW (ref 12.0–16.0)

## 2016-09-04 LAB — POCT I-STAT 3, ART BLOOD GAS (G3+)
Acid-base deficit: 3 mmol/L — ABNORMAL HIGH (ref 0.0–2.0)
Acid-base deficit: 4 mmol/L — ABNORMAL HIGH (ref 0.0–2.0)
Acid-base deficit: 3 mmol/L — ABNORMAL HIGH (ref 0.0–2.0)
Acid-base deficit: 3 mmol/L — ABNORMAL HIGH (ref 0.0–2.0)
Bicarbonate: 22.7 mmol/L (ref 20.0–28.0)
Bicarbonate: 22.4 mmol/L (ref 20.0–28.0)
Bicarbonate: 23 mmol/L (ref 20.0–28.0)
Bicarbonate: 23.4 mmol/L (ref 20.0–28.0)
O2 Saturation: 99 %
O2 Saturation: 98 %
O2 Saturation: 98 %
O2 Saturation: 93 %
Patient temperature: 36.9
Patient temperature: 36.7
Patient temperature: 36.3
Patient temperature: 36.2
TCO2: 24 mmol/L (ref 0–100)
TCO2: 24 mmol/L (ref 0–100)
TCO2: 24 mmol/L (ref 0–100)
TCO2: 25 mmol/L (ref 0–100)
pCO2 arterial: 45.7 mmHg (ref 32.0–48.0)
pCO2 arterial: 45.4 mmHg (ref 32.0–48.0)
pCO2 arterial: 43.1 mmHg (ref 32.0–48.0)
pCO2 arterial: 44.7 mmHg (ref 32.0–48.0)
pH, Arterial: 7.303 — ABNORMAL LOW (ref 7.350–7.450)
pH, Arterial: 7.3 — ABNORMAL LOW (ref 7.350–7.450)
pH, Arterial: 7.332 — ABNORMAL LOW (ref 7.350–7.450)
pH, Arterial: 7.322 — ABNORMAL LOW (ref 7.350–7.450)
pO2, Arterial: 146 mmHg — ABNORMAL HIGH (ref 83.0–108.0)
pO2, Arterial: 108 mmHg (ref 83.0–108.0)
pO2, Arterial: 109 mmHg — ABNORMAL HIGH (ref 83.0–108.0)
pO2, Arterial: 70 mmHg — ABNORMAL LOW (ref 83.0–108.0)

## 2016-09-04 LAB — PREPARE FRESH FROZEN PLASMA
UNIT DIVISION: 0
Unit division: 0

## 2016-09-04 LAB — CBC
HCT: 22.5 % — ABNORMAL LOW (ref 36.0–46.0)
HCT: 26.6 % — ABNORMAL LOW (ref 36.0–46.0)
Hemoglobin: 7.4 g/dL — ABNORMAL LOW (ref 12.0–15.0)
Hemoglobin: 9 g/dL — ABNORMAL LOW (ref 12.0–15.0)
MCH: 30 pg (ref 26.0–34.0)
MCH: 30.7 pg (ref 26.0–34.0)
MCHC: 32.9 g/dL (ref 30.0–36.0)
MCHC: 33.8 g/dL (ref 30.0–36.0)
MCV: 91.1 fL (ref 78.0–100.0)
MCV: 90.8 fL (ref 78.0–100.0)
PLATELETS: 112 10*3/uL — AB (ref 150–400)
Platelets: 102 10*3/uL — ABNORMAL LOW (ref 150–400)
RBC: 2.47 MIL/uL — AB (ref 3.87–5.11)
RBC: 2.93 MIL/uL — ABNORMAL LOW (ref 3.87–5.11)
RDW: 16.2 % — ABNORMAL HIGH (ref 11.5–15.5)
RDW: 16.3 % — ABNORMAL HIGH (ref 11.5–15.5)
WBC: 9.7 10*3/uL (ref 4.0–10.5)
WBC: 13.9 10*3/uL — ABNORMAL HIGH (ref 4.0–10.5)

## 2016-09-04 LAB — CREATININE, SERUM
CREATININE: 2.43 mg/dL — AB (ref 0.44–1.00)
GFR calc Af Amer: 21 mL/min — ABNORMAL LOW (ref >60)
GFR, EST NON AFRICAN AMERICAN: 18 mL/min — AB (ref >60)

## 2016-09-04 LAB — MAGNESIUM
MAGNESIUM: 2.7 mg/dL — AB (ref 1.7–2.4)
Magnesium: 2.8 mg/dL — ABNORMAL HIGH (ref 1.7–2.4)

## 2016-09-04 LAB — HEMOGLOBIN A1C
Hgb A1c MFr Bld: 9.3 % — ABNORMAL HIGH (ref 4.8–5.6)
Mean Plasma Glucose: 220 mg/dL

## 2016-09-04 MED ORDER — AMIODARONE HCL IN DEXTROSE 360-4.14 MG/200ML-% IV SOLN
60.0000 mg/h | INTRAVENOUS | Status: AC
  Administered 2016-09-04 (×2): 60 mg/h via INTRAVENOUS

## 2016-09-04 MED ORDER — METOPROLOL TARTRATE 12.5 MG HALF TABLET: 12.5000 mg | ORAL_TABLET | Freq: Two times a day (BID) | ORAL | Status: DC

## 2016-09-04 MED ORDER — POTASSIUM CHLORIDE 10 MEQ/50ML IV SOLN
10.0000 meq | INTRAVENOUS | Status: AC
  Administered 2016-09-04 (×2): 10 meq via INTRAVENOUS
  Filled 2016-09-04: qty 50

## 2016-09-04 MED ORDER — AMIODARONE HCL IN DEXTROSE 360-4.14 MG/200ML-% IV SOLN
30.0000 mg/h | INTRAVENOUS | Status: DC
  Administered 2016-09-05: 30 mg/h via INTRAVENOUS
  Filled 2016-09-04: qty 200

## 2016-09-04 MED ORDER — INSULIN ASPART 100 UNIT/ML ~~LOC~~ SOLN: 0.0000 [IU] | SUBCUTANEOUS | Status: DC

## 2016-09-04 MED ORDER — SODIUM BICARBONATE 8.4 % IV SOLN
50.0000 meq | Freq: Once | INTRAVENOUS | Status: AC
  Administered 2016-09-04: 50 meq via INTRAVENOUS

## 2016-09-04 MED ORDER — METOPROLOL TARTRATE 25 MG/10 ML ORAL SUSPENSION: 12.5000 mg | Freq: Two times a day (BID) | ORAL | Status: DC

## 2016-09-04 MED ORDER — AMIODARONE HCL IN DEXTROSE 360-4.14 MG/200ML-% IV SOLN
INTRAVENOUS | Status: AC
  Filled 2016-09-04: qty 200

## 2016-09-04 MED ORDER — POTASSIUM CHLORIDE 10 MEQ/50ML IV SOLN: 10.0000 meq | INTRAVENOUS | Status: DC

## 2016-09-04 MED ORDER — INSULIN ASPART 100 UNIT/ML ~~LOC~~ SOLN
0.0000 [IU] | SUBCUTANEOUS | Status: DC
  Administered 2016-09-04: 4 [IU] via SUBCUTANEOUS
  Administered 2016-09-05: 8 [IU] via SUBCUTANEOUS
  Administered 2016-09-05: 4 [IU] via SUBCUTANEOUS
  Administered 2016-09-05: 8 [IU] via SUBCUTANEOUS
  Administered 2016-09-05: 4 [IU] via SUBCUTANEOUS
  Administered 2016-09-05: 2 [IU] via SUBCUTANEOUS
  Administered 2016-09-05 – 2016-09-06 (×2): 8 [IU] via SUBCUTANEOUS

## 2016-09-04 MED ORDER — FUROSEMIDE 10 MG/ML IJ SOLN
80.0000 mg | Freq: Every day | INTRAMUSCULAR | Status: DC
  Administered 2016-09-04: 80 mg via INTRAVENOUS
  Filled 2016-09-04: qty 8

## 2016-09-04 MED FILL — Mannitol IV Soln 20%: INTRAVENOUS | Qty: 500 | Status: AC

## 2016-09-04 MED FILL — Sodium Chloride IV Soln 0.9%: INTRAVENOUS | Qty: 2000 | Status: AC

## 2016-09-04 MED FILL — Sodium Bicarbonate IV Soln 8.4%: INTRAVENOUS | Qty: 50 | Status: AC

## 2016-09-04 MED FILL — Lidocaine HCl IV Inj 20 MG/ML: INTRAVENOUS | Qty: 5 | Status: AC

## 2016-09-04 MED FILL — Electrolyte-R (PH 7.4) Solution: INTRAVENOUS | Qty: 4000 | Status: AC

## 2016-09-04 MED FILL — Albumin, Human Inj 5%: INTRAVENOUS | Qty: 250 | Status: AC

## 2016-09-04 MED FILL — Heparin Sodium (Porcine) Inj 1000 Unit/ML: INTRAMUSCULAR | Qty: 10 | Status: AC

## 2016-09-04 MED FILL — Calcium Chloride Inj 10%: INTRAVENOUS | Qty: 10 | Status: AC

## 2016-09-04 NOTE — Progress Notes (Signed)
Pt with EKG changed, 12 lead obtained, pt in afib rate 70-80's, MD in OR, OR RN notified and will notify MD, will continue to monitor.   Jillian Willis S  11:50 AM

## 2016-09-04 NOTE — Care Management Note (Signed)
Case Management Note Marvetta Gibbons RN, BSN Unit 2W-Case Manager-- [place] coverage [phone]  Patient Details  Name: PETRONELLA SHUFORD MRN: [id number] Date of Birth: 1940-10-17  Subjective/Objective:  Pt admitted s/p CABGx3 on 09/17/2016 - plan wean to extubate today 6/14                Action/Plan: PTA pt lived at home with spouse- CM to follow progression for d/c needs-   Expected Discharge Date:                  Expected Discharge Plan:  Home/Self Care  In-House Referral:     Discharge planning Services  CM Consult  Post Acute Care Choice:    Choice offered to:     DME Arranged:    DME Agency:     HH Arranged:    HH Agency:     Status of Service:  In process, will continue to follow  If discussed at Long Length of Stay Meetings, dates discussed:    Discharge Disposition:   Additional Comments:  Dawayne Patricia, RN 09/04/2016, 9:46 AM

## 2016-09-04 NOTE — Op Note (Signed)
NAME:  Jillian Willis, Jillian Willis                    ACCOUNT NO.:  MEDICAL RECORD NO.:  [id number]  LOCATION:                                 FACILITY:  PHYSICIAN:  Ivin Poot, M.D.       DATE OF BIRTH:  DATE OF PROCEDURE:  08/31/2016 DATE OF DISCHARGE:                              OPERATIVE REPORT   OPERATIONS: 1. Urgent coronary artery bypass grafting x3 (left internal mammary     artery to left anterior descending, saphenous vein graft to ramus     intermedius, saphenous vein graft to right coronary artery). 2. Endoscopic harvest of left leg, greater saphenous vein.  SURGEON:  Ivin Poot, M.D.  ASSISTANT:  Lars Pinks, PA  ANESTHESIA:  General by Glynda Jaeger, M.D.  PREOPERATIVE DIAGNOSES:  Ischemic cardiomyopathy, ejection fraction 20% to 25%, class 4 congestive heart failure, morbid obesity, severe 3- vessel coronary artery disease.  POSTOPERATIVE DIAGNOSES:  Ischemic cardiomyopathy, ejection fraction 20% to 25%, class 4 congestive heart failure, morbid obesity, severe 3- vessel coronary artery disease.  CLINICAL NOTE:  The patient is a 76 year old, obese, diabetic, nonsmoker, with known coronary artery disease.  I previously saw the patient as a consult in the office for treatment of recently diagnosed severe 3-vessel coronary artery disease with poor LV function.  I felt that she was a very high risk candidate because of venous insufficiency in her legs and probable poor conduit as well as severe LV dysfunction. She was sent for vein mapping to assess her conduit and a cardiac MRI viability study.  The vein mapping was completed, which showed dilated vein in the right leg, but satisfactory, but not ideal vein in the left leg.  The viability study was not completed, but the patient presented to the Cardiology office with symptoms of low output heart failure and 9- pound weight gain with rising creatinine, low blood pressure, and pleural effusions.  She had a  mildly positive troponin and a BNP of 1200.  She was admitted and I evaluated the patient.  Her cardiologist recommended urgent coronary artery bypass grafting with which I agreed. I discussed the procedure of high-risk CABG with the patient including the details of the operation, the alternatives to surgery, the expected postop recovery, and the potential risks.  We discussed the location of the surgical incisions, the use of general anesthesia, and cardiopulmonary bypass, and the potential risks of bleeding, blood transfusion requirement, stroke, postoperative infection, postoperative MI, postoperative pulmonary problems including pleural effusions, and death.  After reviewing these issues, she demonstrated her understanding and agreed to proceed with surgery under what I felt was an informed consent.  FINDINGS: 1. Intramyocardial LAD and ramus vessels.  The LAD and right coronary     artery were heavily diseased suboptimal, but graftable targets. 2. Improved global LV function after separation from cardiopulmonary     bypass. 3. Dilated LV and RV with scarring in the LV anterior wall and apex. 4. Adequate venous conduit harvested from the left leg, good flow     through a 1.4 mm mammary artery conduit.  DESCRIPTION OF PROCEDURE:  The patient was brought to the operating  room, placed supine on the operating table where general anesthesia was induced under invasive hemodynamic monitoring.  The chest, abdomen, and legs were prepped with Betadine and draped as a sterile field.  A proper time-out was performed.  A sternal incision was made as the saphenous vein was harvested endoscopically from the left leg.  The left internal mammary artery was harvested as a pedicle graft from its origin at the subclavian vessels.  This was difficult and tedious because of her short stature and obese body habitus.  The sternal retractor was placed using the deep blades.  The pericardium was opened  and suspended.  Pursestrings were placed in the ascending aorta and right atrium.  After heparin was administered, the ACT was documented as being therapeutic and the patient was cannulated and placed on cardiopulmonary bypass.  The coronaries were identified for grafting.  The mammary artery and vein grafts were prepared for the distal anastomoses.  Cardioplegia cannulas were placed, both antegrade and retrograde cold blood cardioplegia.  The patient was cooled to 32 degrees.  The aortic crossclamp was applied and 1 liter of cold blood cardioplegia was delivered in split doses between the antegrade aortic and retrograde coronary sinus catheters.  There was good cardioplegic arrest and septal temperature dropped less than 12 degrees. Cardioplegia was delivered every 20 minutes.  The distal coronary anastomoses were performed.  The first distal anastomosis was to the right coronary.  This was a small codominant vessel, 1.4 mm.  It had a proximal 95% stenosis.  A reverse saphenous vein was sewn end-to-side with running 7-0 Prolene with good flow through the graft.  Cardioplegia was redosed.  The second distal anastomosis was to the ramus intermedius branch of the left coronary.  This had a proximal left main 80% stenosis.  A reverse saphenous vein was sewn end-to-side with running 7-0 Prolene to this intramyocardial vessel with good flow.  Cardioplegia was redosed.  The third distal anastomosis was the mid to distal third of the LAD.  It was intramyocardial, heavily diseased, and small.  The left IMA pedicle was brought through an opening in the left lateral pericardium, was brought down onto the LAD and sewn end-to-side with running 8-0 Prolene. There was good flow through the anastomosis after briefly releasing the pedicle bulldog on the mammary artery.  The bulldog was reapplied.  The pedicle was secured to the epicardium.  Cardioplegia was redosed.  The cross-clamp was still in  place, 2 proximal vein anastomoses were performed on the ascending aorta using a 4.5-mm punch running 6-0 Prolene.  Prior to tying down the final proximal anastomosis, air was vented from the coronaries with a dose of retrograde warm blood cardioplegia.  The crossclamp was removed.  The vein grafts were de-aired and opened.  The heart was cardioverted once to a regular rhythm.  The proximal and distal anastomoses were checked and found to be hemostatic.  The patient was rewarmed and reperfused.  Temporary pacing wires were applied.  The patient was started on low-dose milrinone and dopamine.  The lungs were expanded and ventilator was resumed.  800 mL of heart failure fluid was removed from the right pleural space and 300 mL of heart failure fluid was removed from the left pleural space.  The patient was then weaned carefully and slowly off cardiopulmonary bypass successfully.  Global LV function showed improvement.  There was mild-to-moderate mitral regurgitation.  This was baseline.  The patient remained stable.  Cardiac output was over 3 L/minute.  Protamine  was administered without adverse reaction.  The cannulas were removed.  The mediastinum was irrigated.  The superior pericardial fat was closed over the aorta.  Anterior mediastinal and left pleural chest tubes were placed and brought out through separate incisions.  The sternum was closed with wire.  The patient remained stable.  The pectoralis fascia was closed with a running #1 Vicryl.  Subcutaneous fat and skin were closed in running Vicryl.  Sterile dressings were applied.  Total cardiopulmonary bypass time was 100 minutes.     Ivin Poot, M.D.     PV/MEDQ  D:  08/22/2016  T:  08/29/2016  Job:  [id number]  cc:   Marcine Matar, N.P.

## 2016-09-04 NOTE — Progress Notes (Signed)
RT NOTE:  Cardiac Rapid Wean initiated. Pt follows commands.  

## 2016-09-04 NOTE — Progress Notes (Signed)
NIF -30 cmh20 and VC 600cc

## 2016-09-04 NOTE — Procedures (Signed)
Extubation Procedure Note  Patient Details:   Name: Jillian Willis DOB: Jul 01, 1940 MRN: [id number]   Airway Documentation: Patient had good parameters extubated to 4 lpm [place].  Pt able to say her name and had a good cough.  IS instruction given     Evaluation  O2 sats: stable throughout Complications: No apparent complications Patient did tolerate procedure well. Bilateral Breath Sounds: Clear   Yes  Ned Grace 09/04/2016, 10:55 AM

## 2016-09-04 NOTE — Progress Notes (Signed)
RT NOTE:  CPAP/PS initiated 

## 2016-09-04 NOTE — Progress Notes (Addendum)
[facility] DAILY ICU PROGRESS NOTE                   [address]            [place] [address]          [phone]   1 Day Post-Op Procedure(s) (LRB): CORONARY ARTERY BYPASS GRAFTING (CABG), ON PUMP, TIMES THREE, USING LEFT INTERNAL MAMMARY ARTERY AND ENDOSCOPICALLY HARVESTED LEFT GREATER SAPHENOUS VEIN (N/A) TRANSESOPHAGEAL ECHOCARDIOGRAM (TEE) (N/A)  Total Length of Stay:  LOS: 1 day   Subjective: Patient opened eyes when I called her name. Sedation lightened as are trying to wean to extubate this am. She is hot so we took off her gown.  Objective: Vital signs in last 24 hours: Temp:  [94.8 F (34.9 C)-98.4 F (36.9 C)] 98.2 F (36.8 C) (06/14 0700) Pulse Rate:  [90-96] 90 (06/14 0410) Cardiac Rhythm: Normal sinus rhythm (06/14 0400) Resp:  [0-30] 17 (06/14 0410) BP: (79-164)/(34-128) 93/43 (06/14 0700) SpO2:  [98 %-100 %] 100 % (06/14 0700) Arterial Line BP: (86-130)/(24-58) 125/46 (06/14 0700) FiO2 (%):  [40 %-50 %] 40 % (06/14 0651) Weight:  [96.2 kg (212 lb 1.3 oz)] 96.2 kg (212 lb 1.3 oz) (06/14 0500)  Filed Weights   09/07/2016 0324 09/04/16 0500  Weight: 89.4 kg (197 lb 3.2 oz) 96.2 kg (212 lb 1.3 oz)    Weight change: 6.751 kg (14 lb 14.1 oz)   Hemodynamic parameters for last 24 hours: PAP: (26-42)/(15-26) 30/20 CO:  [2.6 L/min-4.5 L/min] 4.1 L/min CI:  [1.3 L/min/m2-2.4 L/min/m2] 2.1 L/min/m2  Intake/Output from previous day: 06/13 0701 - 06/14 0700 In: 5820.2 [I.V.:3312.2; Blood:808; IV Piggyback:1700] Out: 7353 [Urine:2155; Blood:1500; Chest Tube:410]  Intake/Output this shift: No intake/output data recorded.  Current Meds: Scheduled Meds: . acetaminophen  1,000 mg Oral Q6H   Or  . acetaminophen (TYLENOL) oral liquid 160 mg/5 mL  1,000 mg Per Tube Q6H  . aspirin EC  325 mg Oral Daily   Or  . aspirin  324 mg Per Tube Daily  . bisacodyl  10 mg Oral Daily   Or  . bisacodyl  10 mg Rectal Daily  . chlorhexidine gluconate (MEDLINE KIT)   15 mL Mouth Rinse BID  . docusate sodium  200 mg Oral Daily  . escitalopram  10 mg Oral Daily  . ezetimibe  10 mg Oral Daily  . insulin regular  0-10 Units Intravenous TID WC  . lactulose  10 g Oral BID  . levothyroxine  100 mcg Oral QAC breakfast  . liothyronine  25 mcg Oral Daily  . mouth rinse  15 mL Mouth Rinse QID  . metoCLOPramide (REGLAN) injection  10 mg Intravenous Q6H  . metoprolol tartrate  12.5 mg Oral BID   Or  . metoprolol tartrate  12.5 mg Per Tube BID  . [START ON 09/05/2016] pantoprazole  40 mg Oral Daily  . rosuvastatin  10 mg Oral q1800  . sodium chloride flush  3 mL Intravenous Q12H   Continuous Infusions: . sodium chloride 20 mL/hr at 09/04/16 0600  . sodium chloride 250 mL (09/04/16 0624)  . sodium chloride 10 mL/hr at 09/04/16 0600  . albumin human    . cefUROXime (ZINACEF)  IV Stopped (09/04/16 0527)  . dexmedetomidine (PRECEDEX) IV infusion Stopped (09/04/16 0600)  . DOPamine 5 mcg/kg/min (09/04/16 0600)  . insulin (NOVOLIN-R) infusion 1 Units/hr (09/04/16 0600)  . lactated ringers    . lactated ringers 20 mL/hr at 09/04/16  0600  . milrinone 0.3 mcg/kg/min (09/04/16 0600)  . nitroGLYCERIN Stopped (09/05/2016 1545)  . norepinephrine (LEVOPHED) Adult infusion 18 mcg/min (09/04/16 0626)  . potassium chloride 10 mEq (09/04/16 0622)  . vancomycin 1,000 mg (09/04/16 0626)   PRN Meds:.sodium chloride, albumin human, metoprolol tartrate, midazolam, morphine injection, ondansetron (ZOFRAN) IV, sodium chloride flush, traMADol  General appearance: no distress, moderately obese and pale Neurologic: intact Heart: RRR, rub with chest tubes in placed Lungs: Diminshed at bases Abdomen: Soft, obese, bowel sounds present  Extremities: SCD right LE and LLE with edema Wound: LLE dressing is clean and dry;Aquacel dressing intact  Lab Results: CBC: Recent Labs  08/23/2016 2119 09/04/16 0310  WBC 10.8* 9.7  HGB 7.7* 7.4*  HCT 24.0* 22.5*  PLT 117* 112*   BMET:    Recent Labs  09/16/2016 0552  09/05/2016 2117 08/30/2016 2119 09/04/16 0310  NA 137  < > 140  --  138  K 3.4*  < > 3.9  --  3.7  CL 102  < > 105  --  107  CO2 26  --   --   --  21*  GLUCOSE 47*  < > 111*  --  122*  BUN 63*  < > 42*  --  45*  CREATININE 2.22*  < > 1.80* 1.89* 2.13*  CALCIUM 8.3*  --   --   --  9.0  < > = values in this interval not displayed.  CMET: Lab Results  Component Value Date   WBC 9.7 09/04/2016   HGB 7.4 (L) 09/04/2016   HCT 22.5 (L) 09/04/2016   PLT 112 (L) 09/04/2016   GLUCOSE 122 (H) 09/04/2016   CHOL (H) 05/09/2007    256        ATP III CLASSIFICATION:  <200     mg/dL   Desirable  200-239  mg/dL   Borderline High  >=240    mg/dL   High   TRIG 123 05/09/2007   HDL 57 05/09/2007   LDLCALC (H) 05/09/2007    174        Total Cholesterol/HDL:CHD Risk Coronary Heart Disease Risk Table                     Men   Women  1/2 Average Risk   3.4   3.3   ALT 23 08/30/2016   AST 33 09/18/2016   NA 138 09/04/2016   K 3.7 09/04/2016   CL 107 09/04/2016   CREATININE 2.13 (H) 09/04/2016   BUN 45 (H) 09/04/2016   CO2 21 (L) 09/04/2016   TSH 9.513 (H) 09/05/2016   INR 1.60 08/22/2016   HGBA1C 9.3 (H) 08/22/2016      PT/INR:  Recent Labs  09/16/2016 1531  LABPROT 19.2*  INR 1.60   Radiology: Dg Chest Port 1 View  Result Date: 08/30/2016 CLINICAL DATA:  Status post CABG. EXAM: PORTABLE CHEST 1 VIEW COMPARISON:  08/27/2016 FINDINGS: 1549 hours. Endotracheal tube tip is about 2.4 cm above the base of the carina. Right IJ pulmonary artery catheter tip is in the right main pulmonary artery. A second right IJ central line tip overlies the mid SVC level. NG tube tip is in the mid stomach. Left chest tube noted without evidence for left-sided pneumothorax. Midline mediastinal/ pericardial drain evident. The cardio pericardial silhouette is enlarged. Bibasilar atelectasis noted with small right pleural effusion, similar to prior. The visualized bony structures of  the thorax are intact. Telemetry leads overlie the chest.  IMPRESSION: Status post CABG with positioning of support apparatus as described. Cardiomegaly with vascular congestion, basilar atelectasis and small right pleural effusion. Electronically Signed   By: Misty Stanley M.D.   On: 08/24/2016 15:54     Assessment/Plan: S/P Procedure(s) (LRB): CORONARY ARTERY BYPASS GRAFTING (CABG), ON PUMP, TIMES THREE, USING LEFT INTERNAL MAMMARY ARTERY AND ENDOSCOPICALLY HARVESTED LEFT GREATER SAPHENOUS VEIN (N/A) TRANSESOPHAGEAL ECHOCARDIOGRAM (TEE) (N/A)  1. CV-Paced, first degree heart block. On Dopamine and Milrinone drips and Lopressor 12.5 mg bid. Will hold Lopressor until am.CO 4.1 and CI 2.1. EKG viewed-RBBB and appears she had LBBB prior to surgery. Check Co ox as trying to wean Milrinone. 2. Pulmonary-Weaning to be extubated. Chest tube output 410 cc since surgery. Will leave chest tubes for now. CXR this am shows patient is rotated to the right, cardiomegaly, no pneumothorax, bibasilar atelectasis and pleural effusions. 3. Volume Overload-Lasix 80 mg IV ordered by Dr. Prescott Gum this am 4. ABL anemia- H and H 7.4 and 22.5. Going to be transfused this am. 5. DM-On Insulin drip. CBGs 140/104/105. Pre op HGA1C 9.3. On Glipizide and Levemir pre op. Will not restart Glipizide until tolerates po better.  6. Thrombocytopenia-platelets 112,000 this am. Will discuss with Dr. Prescott Gum when to start DVT prophylaxis Lovenonx 7. Supplement potassium 8. Patient with a history of hypothyroidism. She is on Synthroid 125 mcg daily. TSH done 09/01/2016 was 9.513. Will check free T4. 8. Please see progression orders.    ZIMMERMAN,DONIELLE M PA-C 09/04/2016 7:15 AM   Ischemic cardiomyopathy with preop low output failure from acute on chronic systolic heart failure Preop acute on chronic renal failure Extubated today Postop blood loss anemia - 1 transfusion post op Postop a-fib now nsr on in amio  patient  examined and medical record reviewed,agree with above note. Tharon Aquas Trigt III 09/04/2016

## 2016-09-04 NOTE — Progress Notes (Signed)
NIF -30 cmh20 and VC 200cc

## 2016-09-04 NOTE — Progress Notes (Signed)
Dr. Cyndia Bent paged at 23:23 to notify about pt's low UOP (5cc/hr for two consecutive hours). MAP borderline 55-65 despite max doses Levo and Dopa. Last cardiac index 1.81. Cannot use pacer d/t paradoxical effect on BP. Bladder scan showed 0cc.  Awaiting response. Will continue to monitor.

## 2016-09-04 NOTE — Progress Notes (Signed)
Patient ID: Jillian Willis, female   DOB: [birth year], 76 y.o.   MRN: [id number]   SICU Evening Rounds:   Hemodynamically stable in sinus rhythm 70's on amiodarone drip. CI = 1.9 on dop 5, milrinone 0.3, levophed 20. sats 96% on [place].  Urine output marginal at 30/hr  CT output low  CBC    Component Value Date/Time   WBC 13.9 (H) 09/04/2016 1618   RBC 2.93 (L) 09/04/2016 1618   HGB 8.5 (L) 09/04/2016 1630   HGB 11.8 08/13/2016 0854   HGB 12.3 05/16/2015 0955   HCT 25.0 (L) 09/04/2016 1630   HCT 35.8 08/13/2016 0854   HCT 36.9 05/16/2015 0955   PLT 102 (L) 09/04/2016 1618   PLT 193 08/13/2016 0854   MCV 90.8 09/04/2016 1618   MCV 95 08/13/2016 0854   MCV 95.4 05/16/2015 0955   MCH 30.7 09/04/2016 1618   MCHC 33.8 09/04/2016 1618   RDW 16.3 (H) 09/04/2016 1618   RDW 14.0 08/13/2016 0854   RDW 13.4 05/16/2015 0955   LYMPHSABS 0.9 09/11/2016 1035   LYMPHSABS 1.5 05/16/2015 0955   MONOABS 0.5 09/19/2016 1035   MONOABS 0.4 05/16/2015 0955   EOSABS 0.0 09/12/2016 1035   EOSABS 0.1 05/16/2015 0955   BASOSABS 0.0 09/08/2016 1035   BASOSABS 0.0 05/16/2015 0955     BMET    Component Value Date/Time   NA 137 09/04/2016 1630   NA 141 08/13/2016 0854   NA 139 05/16/2015 0955   K 4.5 09/04/2016 1630   K 4.5 05/16/2015 0955   CL 104 09/04/2016 1630   CL 100 04/22/2012 0902   CO2 21 (L) 09/04/2016 0310   CO2 25 05/16/2015 0955   GLUCOSE 132 (H) 09/04/2016 1630   GLUCOSE 220 (H) 05/16/2015 0955   GLUCOSE 311 (H) 04/22/2012 0902   BUN 41 (H) 09/04/2016 1630   BUN 48 (H) 08/13/2016 0854   BUN 27.8 (H) 05/16/2015 0955   CREATININE 2.30 (H) 09/04/2016 1630   CREATININE 1.4 (H) 05/16/2015 0955   CALCIUM 9.0 09/04/2016 0310   CALCIUM 9.3 05/16/2015 0955   GFRNONAA 18 (L) 09/04/2016 1618   GFRAA 21 (L) 09/04/2016 1618     A/P:  She is fairly stable on current inotropic support but requiring 20 mg levophed to maintain BP. Will continue at current levels tonight.

## 2016-09-05 ENCOUNTER — Inpatient Hospital Stay (HOSPITAL_COMMUNITY): Payer: Medicare Other

## 2016-09-05 DIAGNOSIS — I5043 Acute on chronic combined systolic (congestive) and diastolic (congestive) heart failure: Secondary | ICD-10-CM

## 2016-09-05 DIAGNOSIS — R570 Cardiogenic shock: Secondary | ICD-10-CM

## 2016-09-05 DIAGNOSIS — N179 Acute kidney failure, unspecified: Secondary | ICD-10-CM

## 2016-09-05 LAB — IRON AND TIBC
Iron: 34 ug/dL (ref 28–170)
SATURATION RATIOS: 18 % (ref 10.4–31.8)
TIBC: 192 ug/dL — AB (ref 250–450)
UIBC: 158 ug/dL

## 2016-09-05 LAB — BLOOD GAS, ARTERIAL
Acid-base deficit: 10.7 mmol/L — ABNORMAL HIGH (ref 0.0–2.0)
Bicarbonate: 15.4 mmol/L — ABNORMAL LOW (ref 20.0–28.0)
Drawn by: 441661
O2 Content: 2 L/min
O2 Saturation: 92.1 %
Patient temperature: 97.7
pCO2 arterial: 37.5 mmHg (ref 32.0–48.0)
pH, Arterial: 7.235 — ABNORMAL LOW (ref 7.350–7.450)
pO2, Arterial: 69 mmHg — ABNORMAL LOW (ref 83.0–108.0)

## 2016-09-05 LAB — GLUCOSE, CAPILLARY
Glucose-Capillary: 135 mg/dL — ABNORMAL HIGH (ref 65–99)
Glucose-Capillary: 185 mg/dL — ABNORMAL HIGH (ref 65–99)
Glucose-Capillary: 202 mg/dL — ABNORMAL HIGH (ref 65–99)
Glucose-Capillary: 204 mg/dL — ABNORMAL HIGH (ref 65–99)
Glucose-Capillary: 213 mg/dL — ABNORMAL HIGH (ref 65–99)

## 2016-09-05 LAB — POCT I-STAT, CHEM 8
BUN: 39 mg/dL — ABNORMAL HIGH (ref 6–20)
BUN: 35 mg/dL — ABNORMAL HIGH (ref 6–20)
BUN: 36 mg/dL — ABNORMAL HIGH (ref 6–20)
BUN: 34 mg/dL — ABNORMAL HIGH (ref 6–20)
BUN: 40 mg/dL — ABNORMAL HIGH (ref 6–20)
BUN: 36 mg/dL — ABNORMAL HIGH (ref 6–20)
BUN: 39 mg/dL — ABNORMAL HIGH (ref 6–20)
BUN: 28 mg/dL — ABNORMAL HIGH (ref 6–20)
BUN: 34 mg/dL — ABNORMAL HIGH (ref 6–20)
Calcium, Ion: 1.04 mmol/L — ABNORMAL LOW (ref 1.15–1.40)
Calcium, Ion: 0.5 mmol/L — CL (ref 1.15–1.40)
Calcium, Ion: 0.86 mmol/L — CL (ref 1.15–1.40)
Calcium, Ion: 0.45 mmol/L — CL (ref 1.15–1.40)
Calcium, Ion: 0.87 mmol/L — CL (ref 1.15–1.40)
Calcium, Ion: 0.72 mmol/L — CL (ref 1.15–1.40)
Calcium, Ion: 0.83 mmol/L — CL (ref 1.15–1.40)
Calcium, Ion: 0.36 mmol/L — CL (ref 1.15–1.40)
Calcium, Ion: 0.79 mmol/L — CL (ref 1.15–1.40)
Chloride: 106 mmol/L (ref 101–111)
Chloride: 105 mmol/L (ref 101–111)
Chloride: 97 mmol/L — ABNORMAL LOW (ref 101–111)
Chloride: 98 mmol/L — ABNORMAL LOW (ref 101–111)
Chloride: 99 mmol/L — ABNORMAL LOW (ref 101–111)
Chloride: 96 mmol/L — ABNORMAL LOW (ref 101–111)
Chloride: 97 mmol/L — ABNORMAL LOW (ref 101–111)
Chloride: 96 mmol/L — ABNORMAL LOW (ref 101–111)
Chloride: 96 mmol/L — ABNORMAL LOW (ref 101–111)
Creatinine, Ser: 2.8 mg/dL — ABNORMAL HIGH (ref 0.44–1.00)
Creatinine, Ser: 2.2 mg/dL — ABNORMAL HIGH (ref 0.44–1.00)
Creatinine, Ser: 2.5 mg/dL — ABNORMAL HIGH (ref 0.44–1.00)
Creatinine, Ser: 2 mg/dL — ABNORMAL HIGH (ref 0.44–1.00)
Creatinine, Ser: 3 mg/dL — ABNORMAL HIGH (ref 0.44–1.00)
Creatinine, Ser: 2.6 mg/dL — ABNORMAL HIGH (ref 0.44–1.00)
Creatinine, Ser: 2.7 mg/dL — ABNORMAL HIGH (ref 0.44–1.00)
Creatinine, Ser: 1.6 mg/dL — ABNORMAL HIGH (ref 0.44–1.00)
Creatinine, Ser: 2.3 mg/dL — ABNORMAL HIGH (ref 0.44–1.00)
Glucose, Bld: 159 mg/dL — ABNORMAL HIGH (ref 65–99)
Glucose, Bld: 138 mg/dL — ABNORMAL HIGH (ref 65–99)
Glucose, Bld: 178 mg/dL — ABNORMAL HIGH (ref 65–99)
Glucose, Bld: 171 mg/dL — ABNORMAL HIGH (ref 65–99)
Glucose, Bld: 187 mg/dL — ABNORMAL HIGH (ref 65–99)
Glucose, Bld: 181 mg/dL — ABNORMAL HIGH (ref 65–99)
Glucose, Bld: 205 mg/dL — ABNORMAL HIGH (ref 65–99)
Glucose, Bld: 248 mg/dL — ABNORMAL HIGH (ref 65–99)
Glucose, Bld: 249 mg/dL — ABNORMAL HIGH (ref 65–99)
HCT: 19 % — ABNORMAL LOW (ref 36.0–46.0)
HCT: 19 % — ABNORMAL LOW (ref 36.0–46.0)
HCT: 26 % — ABNORMAL LOW (ref 36.0–46.0)
HCT: 21 % — ABNORMAL LOW (ref 36.0–46.0)
HCT: 26 % — ABNORMAL LOW (ref 36.0–46.0)
HCT: 22 % — ABNORMAL LOW (ref 36.0–46.0)
HCT: 22 % — ABNORMAL LOW (ref 36.0–46.0)
HCT: 27 % — ABNORMAL LOW (ref 36.0–46.0)
HCT: 34 % — ABNORMAL LOW (ref 36.0–46.0)
Hemoglobin: 6.5 g/dL — CL (ref 12.0–15.0)
Hemoglobin: 6.5 g/dL — CL (ref 12.0–15.0)
Hemoglobin: 8.8 g/dL — ABNORMAL LOW (ref 12.0–15.0)
Hemoglobin: 7.1 g/dL — ABNORMAL LOW (ref 12.0–15.0)
Hemoglobin: 8.8 g/dL — ABNORMAL LOW (ref 12.0–15.0)
Hemoglobin: 7.5 g/dL — ABNORMAL LOW (ref 12.0–15.0)
Hemoglobin: 7.5 g/dL — ABNORMAL LOW (ref 12.0–15.0)
Hemoglobin: 11.6 g/dL — ABNORMAL LOW (ref 12.0–15.0)
Hemoglobin: 9.2 g/dL — ABNORMAL LOW (ref 12.0–15.0)
Potassium: 3.6 mmol/L (ref 3.5–5.1)
Potassium: 3.3 mmol/L — ABNORMAL LOW (ref 3.5–5.1)
Potassium: 3.9 mmol/L (ref 3.5–5.1)
Potassium: 3.8 mmol/L (ref 3.5–5.1)
Potassium: 3.9 mmol/L (ref 3.5–5.1)
Potassium: 3.8 mmol/L (ref 3.5–5.1)
Potassium: 3.8 mmol/L (ref 3.5–5.1)
Potassium: 3.7 mmol/L (ref 3.5–5.1)
Potassium: 3.7 mmol/L (ref 3.5–5.1)
Sodium: 138 mmol/L (ref 135–145)
Sodium: 139 mmol/L (ref 135–145)
Sodium: 140 mmol/L (ref 135–145)
Sodium: 137 mmol/L (ref 135–145)
Sodium: 139 mmol/L (ref 135–145)
Sodium: 137 mmol/L (ref 135–145)
Sodium: 138 mmol/L (ref 135–145)
Sodium: 136 mmol/L (ref 135–145)
Sodium: 139 mmol/L (ref 135–145)
TCO2: 16 mmol/L (ref 0–100)
TCO2: 16 mmol/L (ref 0–100)
TCO2: 20 mmol/L (ref 0–100)
TCO2: 17 mmol/L (ref 0–100)
TCO2: 20 mmol/L (ref 0–100)
TCO2: 18 mmol/L (ref 0–100)
TCO2: 20 mmol/L (ref 0–100)
TCO2: 17 mmol/L (ref 0–100)
TCO2: 19 mmol/L (ref 0–100)

## 2016-09-05 LAB — POCT I-STAT 3, ART BLOOD GAS (G3+)
Acid-base deficit: 7 mmol/L — ABNORMAL HIGH (ref 0.0–2.0)
Acid-base deficit: 10 mmol/L — ABNORMAL HIGH (ref 0.0–2.0)
Acid-base deficit: 9 mmol/L — ABNORMAL HIGH (ref 0.0–2.0)
Acid-base deficit: 12 mmol/L — ABNORMAL HIGH (ref 0.0–2.0)
Bicarbonate: 18.5 mmol/L — ABNORMAL LOW (ref 20.0–28.0)
Bicarbonate: 17 mmol/L — ABNORMAL LOW (ref 20.0–28.0)
Bicarbonate: 16.7 mmol/L — ABNORMAL LOW (ref 20.0–28.0)
Bicarbonate: 15.7 mmol/L — ABNORMAL LOW (ref 20.0–28.0)
O2 Saturation: 94 %
O2 Saturation: 94 %
O2 Saturation: 97 %
O2 Saturation: 99 %
Patient temperature: 97.3
Patient temperature: 96.8
Patient temperature: 92.3
TCO2: 20 mmol/L (ref 0–100)
TCO2: 18 mmol/L (ref 0–100)
TCO2: 18 mmol/L (ref 0–100)
TCO2: 17 mmol/L (ref 0–100)
pCO2 arterial: 36.5 mmHg (ref 32.0–48.0)
pCO2 arterial: 40.5 mmHg (ref 32.0–48.0)
pCO2 arterial: 33.5 mmHg (ref 32.0–48.0)
pCO2 arterial: 35.2 mmHg (ref 32.0–48.0)
pH, Arterial: 7.31 — ABNORMAL LOW (ref 7.350–7.450)
pH, Arterial: 7.232 — ABNORMAL LOW (ref 7.350–7.450)
pH, Arterial: 7.301 — ABNORMAL LOW (ref 7.350–7.450)
pH, Arterial: 7.238 — ABNORMAL LOW (ref 7.350–7.450)
pO2, Arterial: 76 mmHg — ABNORMAL LOW (ref 83.0–108.0)
pO2, Arterial: 84 mmHg (ref 83.0–108.0)
pO2, Arterial: 95 mmHg (ref 83.0–108.0)
pO2, Arterial: 121 mmHg — ABNORMAL HIGH (ref 83.0–108.0)

## 2016-09-05 LAB — CBC
HCT: 26.2 % — ABNORMAL LOW (ref 36.0–46.0)
HCT: 24.4 % — ABNORMAL LOW (ref 36.0–46.0)
HCT: 24.2 % — ABNORMAL LOW (ref 36.0–46.0)
HEMOGLOBIN: 8.2 g/dL — AB (ref 12.0–15.0)
HEMOGLOBIN: 8 g/dL — AB (ref 12.0–15.0)
Hemoglobin: 8.7 g/dL — ABNORMAL LOW (ref 12.0–15.0)
MCH: 30.1 pg (ref 26.0–34.0)
MCH: 30.1 pg (ref 26.0–34.0)
MCH: 29.9 pg (ref 26.0–34.0)
MCHC: 33.2 g/dL (ref 30.0–36.0)
MCHC: 33.6 g/dL (ref 30.0–36.0)
MCHC: 33.1 g/dL (ref 30.0–36.0)
MCV: 90.7 fL (ref 78.0–100.0)
MCV: 89.7 fL (ref 78.0–100.0)
MCV: 90.3 fL (ref 78.0–100.0)
PLATELETS: 89 10*3/uL — AB (ref 150–400)
Platelets: 101 10*3/uL — ABNORMAL LOW (ref 150–400)
Platelets: 89 10*3/uL — ABNORMAL LOW (ref 150–400)
RBC: 2.89 MIL/uL — ABNORMAL LOW (ref 3.87–5.11)
RBC: 2.72 MIL/uL — ABNORMAL LOW (ref 3.87–5.11)
RBC: 2.68 MIL/uL — ABNORMAL LOW (ref 3.87–5.11)
RDW: 16.4 % — ABNORMAL HIGH (ref 11.5–15.5)
RDW: 15.9 % — ABNORMAL HIGH (ref 11.5–15.5)
RDW: 16.1 % — AB (ref 11.5–15.5)
WBC: 16.1 10*3/uL — ABNORMAL HIGH (ref 4.0–10.5)
WBC: 15.4 10*3/uL — AB (ref 4.0–10.5)
WBC: 13.9 10*3/uL — ABNORMAL HIGH (ref 4.0–10.5)

## 2016-09-05 LAB — CG4 I-STAT (LACTIC ACID): Lactic Acid, Venous: 11.28 mmol/L (ref 0.5–1.9)

## 2016-09-05 LAB — RENAL FUNCTION PANEL
Albumin: 3.2 g/dL — ABNORMAL LOW (ref 3.5–5.0)
Anion gap: 17 — ABNORMAL HIGH (ref 5–15)
BUN: 54 mg/dL — AB (ref 6–20)
CALCIUM: 8.4 mg/dL — AB (ref 8.9–10.3)
CO2: 19 mmol/L — AB (ref 22–32)
CREATININE: 3.56 mg/dL — AB (ref 0.44–1.00)
Chloride: 99 mmol/L — ABNORMAL LOW (ref 101–111)
GFR calc Af Amer: 14 mL/min — ABNORMAL LOW (ref >60)
GFR calc non Af Amer: 12 mL/min — ABNORMAL LOW (ref >60)
GLUCOSE: 187 mg/dL — AB (ref 65–99)
Phosphorus: 6.4 mg/dL — ABNORMAL HIGH (ref 2.5–4.6)
Potassium: 4.2 mmol/L (ref 3.5–5.1)
SODIUM: 135 mmol/L (ref 135–145)

## 2016-09-05 LAB — BASIC METABOLIC PANEL
Anion gap: 15 (ref 5–15)
BUN: 50 mg/dL — ABNORMAL HIGH (ref 6–20)
CO2: 17 mmol/L — ABNORMAL LOW (ref 22–32)
Calcium: 8.4 mg/dL — ABNORMAL LOW (ref 8.9–10.3)
Chloride: 101 mmol/L (ref 101–111)
Creatinine, Ser: 3.04 mg/dL — ABNORMAL HIGH (ref 0.44–1.00)
GFR calc Af Amer: 16 mL/min — ABNORMAL LOW (ref >60)
GFR calc non Af Amer: 14 mL/min — ABNORMAL LOW (ref >60)
Glucose, Bld: 217 mg/dL — ABNORMAL HIGH (ref 65–99)
Potassium: 4.2 mmol/L (ref 3.5–5.1)
Sodium: 133 mmol/L — ABNORMAL LOW (ref 135–145)

## 2016-09-05 LAB — DIFFERENTIAL
BASOS ABS: 0 10*3/uL (ref 0.0–0.1)
Basophils Relative: 0 %
EOS ABS: 0 10*3/uL (ref 0.0–0.7)
Eosinophils Relative: 0 %
LYMPHS ABS: 1 10*3/uL (ref 0.7–4.0)
LYMPHS PCT: 7 %
Monocytes Absolute: 1.1 10*3/uL — ABNORMAL HIGH (ref 0.1–1.0)
Monocytes Relative: 7 %
NEUTROS PCT: 86 %
Neutro Abs: 13.3 10*3/uL — ABNORMAL HIGH (ref 1.7–7.7)

## 2016-09-05 LAB — LACTIC ACID, PLASMA
LACTIC ACID, VENOUS: 5.8 mmol/L — AB (ref 0.5–1.9)
LACTIC ACID, VENOUS: 8.8 mmol/L — AB (ref 0.5–1.9)

## 2016-09-05 LAB — COOXEMETRY PANEL
Carboxyhemoglobin: 0.9 % (ref 0.5–1.5)
Carboxyhemoglobin: 0.7 % (ref 0.5–1.5)
METHEMOGLOBIN: 1.1 % (ref 0.0–1.5)
Methemoglobin: 1.1 % (ref 0.0–1.5)
O2 SAT: 53.3 %
O2 Saturation: 73.8 %
TOTAL HEMOGLOBIN: 7.7 g/dL — AB (ref 12.0–16.0)
Total hemoglobin: 8 g/dL — ABNORMAL LOW (ref 12.0–16.0)

## 2016-09-05 LAB — T4, FREE: Free T4: 0.79 ng/dL (ref 0.61–1.12)

## 2016-09-05 LAB — CK: CK TOTAL: 409 U/L — AB (ref 38–234)

## 2016-09-05 LAB — POCT ACTIVATED CLOTTING TIME: Activated Clotting Time: 158 seconds

## 2016-09-05 MED ORDER — CHLORHEXIDINE GLUCONATE CLOTH 2 % EX PADS: 6.0000 | MEDICATED_PAD | Freq: Every day | CUTANEOUS | Status: DC

## 2016-09-05 MED ORDER — AMIODARONE HCL IN DEXTROSE 360-4.14 MG/200ML-% IV SOLN
30.0000 mg/h | INTRAVENOUS | Status: AC
  Administered 2016-09-05 (×2): 30 mg/h via INTRAVENOUS
  Filled 2016-09-05 (×2): qty 200

## 2016-09-05 MED ORDER — SODIUM BICARBONATE 8.4 % IV SOLN
50.0000 meq | Freq: Once | INTRAVENOUS | Status: AC
  Administered 2016-09-05: 50 meq via INTRAVENOUS

## 2016-09-05 MED ORDER — ALBUMIN HUMAN 25 % IV SOLN
25.0000 g | Freq: Once | INTRAVENOUS | Status: AC
  Administered 2016-09-06: 25 g via INTRAVENOUS
  Filled 2016-09-05: qty 100

## 2016-09-05 MED ORDER — FUROSEMIDE 10 MG/ML IJ SOLN: 80.0000 mg | Freq: Once | INTRAMUSCULAR | Status: DC

## 2016-09-05 MED ORDER — PIPERACILLIN-TAZOBACTAM 3.375 G IVPB 30 MIN: 3.3750 g | Freq: Three times a day (TID) | INTRAVENOUS | Status: DC

## 2016-09-05 MED ORDER — PRISMASOL B22GK 4/0 22-4 MEQ/L IV SOLN
INTRAVENOUS | Status: DC
  Administered 2016-09-05 – 2016-09-06 (×3): via INTRAVENOUS_CENTRAL
  Filled 2016-09-05 (×8): qty 5000

## 2016-09-05 MED ORDER — HEPARIN SODIUM (PORCINE) 1000 UNIT/ML DIALYSIS
1000.0000 [IU] | INTRAMUSCULAR | Status: DC | PRN
  Filled 2016-09-05: qty 6

## 2016-09-05 MED ORDER — EPINEPHRINE PF 1 MG/ML IJ SOLN
0.5000 ug/min | INTRAVENOUS | Status: DC
  Administered 2016-09-05: 1 ug/min via INTRAVENOUS
  Administered 2016-09-05: 20 ug/min via INTRAVENOUS
  Filled 2016-09-05 (×3): qty 4

## 2016-09-05 MED ORDER — MIDAZOLAM HCL 2 MG/2ML IJ SOLN
INTRAMUSCULAR | Status: AC
  Administered 2016-09-05: 2 mg
  Filled 2016-09-05: qty 2

## 2016-09-05 MED ORDER — FUROSEMIDE 10 MG/ML IJ SOLN
120.0000 mg | Freq: Once | INTRAMUSCULAR | Status: AC
  Administered 2016-09-05: 120 mg via INTRAVENOUS
  Filled 2016-09-05: qty 10

## 2016-09-05 MED ORDER — SODIUM BICARBONATE 8.4 % IV SOLN
50.0000 meq | Freq: Once | INTRAVENOUS | Status: AC
  Administered 2016-09-05: 50 meq via INTRAVENOUS
  Filled 2016-09-05: qty 100

## 2016-09-05 MED ORDER — NOREPINEPHRINE BITARTRATE 1 MG/ML IV SOLN
0.0000 ug/min | INTRAVENOUS | Status: DC
  Administered 2016-09-05: 22 ug/min via INTRAVENOUS
  Administered 2016-09-05: 60 ug/min via INTRAVENOUS
  Filled 2016-09-05 (×2): qty 16

## 2016-09-05 MED ORDER — SODIUM BICARBONATE 650 MG PO TABS: 1300.0000 mg | ORAL_TABLET | Freq: Three times a day (TID) | ORAL | Status: DC

## 2016-09-05 MED ORDER — SODIUM CHLORIDE 0.9% FLUSH
10.0000 mL | Freq: Two times a day (BID) | INTRAVENOUS | Status: DC
  Administered 2016-09-05: 10 mL

## 2016-09-05 MED ORDER — HEPARIN SOD (PORK) LOCK FLUSH 100 UNIT/ML IV SOLN
500.0000 [IU] | Freq: Once | INTRAVENOUS | Status: AC
  Administered 2016-09-05: 500 [IU] via INTRAVENOUS
  Filled 2016-09-05: qty 5

## 2016-09-05 MED ORDER — AMIODARONE HCL 200 MG PO TABS
200.0000 mg | ORAL_TABLET | Freq: Every day | ORAL | Status: DC
  Administered 2016-09-05: 200 mg via ORAL
  Filled 2016-09-05: qty 1

## 2016-09-05 MED ORDER — VASOPRESSIN 20 UNIT/ML IV SOLN
0.0300 [IU]/min | INTRAVENOUS | Status: DC
  Administered 2016-09-05: 0.03 [IU]/min via INTRAVENOUS
  Filled 2016-09-05: qty 2

## 2016-09-05 MED ORDER — VANCOMYCIN HCL IN DEXTROSE 1-5 GM/200ML-% IV SOLN: 1000.0000 mg | INTRAVENOUS | Status: DC

## 2016-09-05 MED ORDER — ORAL CARE MOUTH RINSE
15.0000 mL | Freq: Two times a day (BID) | OROMUCOSAL | Status: DC
  Administered 2016-09-05: 15 mL via OROMUCOSAL

## 2016-09-05 MED ORDER — VANCOMYCIN HCL IN DEXTROSE 1-5 GM/200ML-% IV SOLN: 1000.0000 mg | Freq: Two times a day (BID) | INTRAVENOUS | Status: DC

## 2016-09-05 MED ORDER — ALBUMIN HUMAN 25 % IV SOLN
12.5000 g | Freq: Four times a day (QID) | INTRAVENOUS | Status: AC
  Administered 2016-09-05 (×2): 12.5 g via INTRAVENOUS
  Filled 2016-09-05: qty 100

## 2016-09-05 MED ORDER — SODIUM BICARBONATE 8.4 % IV SOLN
50.0000 meq | Freq: Once | INTRAVENOUS | Status: AC
  Administered 2016-09-05: 50 meq via INTRAVENOUS
  Filled 2016-09-05: qty 50

## 2016-09-05 MED ORDER — SODIUM BICARBONATE 8.4 % IV SOLN
INTRAVENOUS | Status: DC
  Administered 2016-09-05: 22:00:00 via INTRAVENOUS
  Filled 2016-09-05 (×2): qty 150

## 2016-09-05 MED ORDER — PRISMASOL BGK 4/2.5 32-4-2.5 MEQ/L IV SOLN
INTRAVENOUS | Status: DC
  Administered 2016-09-05: 19:00:00 via INTRAVENOUS_CENTRAL
  Filled 2016-09-05 (×3): qty 5000

## 2016-09-05 MED ORDER — DARBEPOETIN ALFA 200 MCG/0.4ML IJ SOSY
200.0000 ug | PREFILLED_SYRINGE | INTRAMUSCULAR | Status: DC
  Administered 2016-09-05: 200 ug via SUBCUTANEOUS
  Filled 2016-09-05: qty 0.4

## 2016-09-05 MED ORDER — DEXTROSE 5 % IV SOLN
Status: DC
  Administered 2016-09-05: 19:00:00 via INTRAVENOUS_CENTRAL
  Filled 2016-09-05 (×2): qty 1500

## 2016-09-05 MED ORDER — SODIUM CHLORIDE 0.9 % IV SOLN
2000.0000 mg | Freq: Once | INTRAVENOUS | Status: AC
  Administered 2016-09-06: 2000 mg via INTRAVENOUS
  Filled 2016-09-05: qty 2000

## 2016-09-05 MED ORDER — HYDROCORTISONE NA SUCCINATE PF 100 MG IJ SOLR
100.0000 mg | Freq: Three times a day (TID) | INTRAMUSCULAR | Status: DC
  Administered 2016-09-05: 100 mg via INTRAVENOUS
  Filled 2016-09-05: qty 2

## 2016-09-05 MED ORDER — CALCITRIOL 0.25 MCG PO CAPS: 0.2500 ug | ORAL_CAPSULE | Freq: Every day | ORAL | Status: DC

## 2016-09-05 MED ORDER — LIDOCAINE HCL (PF) 1 % IJ SOLN
INTRAMUSCULAR | Status: AC
  Filled 2016-09-05: qty 30

## 2016-09-05 MED ORDER — SODIUM BICARBONATE 8.4 % IV SOLN
INTRAVENOUS | Status: AC
  Filled 2016-09-05: qty 50

## 2016-09-05 MED ORDER — SODIUM CHLORIDE 0.9 % FOR CRRT
INTRAVENOUS_CENTRAL | Status: DC | PRN
  Filled 2016-09-05: qty 1000

## 2016-09-05 MED ORDER — CHLORHEXIDINE GLUCONATE CLOTH 2 % EX PADS
6.0000 | MEDICATED_PAD | Freq: Every day | CUTANEOUS | Status: DC
  Administered 2016-09-05: 6 via TOPICAL

## 2016-09-05 MED ORDER — AMIODARONE HCL IN DEXTROSE 360-4.14 MG/200ML-% IV SOLN: 30.0000 mg/h | INTRAVENOUS | Status: DC

## 2016-09-05 MED ORDER — TRAMADOL HCL 50 MG PO TABS: 50.0000 mg | ORAL_TABLET | Freq: Two times a day (BID) | ORAL | Status: DC | PRN

## 2016-09-05 MED ORDER — SODIUM CHLORIDE 0.9% FLUSH
10.0000 mL | INTRAVENOUS | Status: DC | PRN
Start: 2016-09-05 — End: 2016-09-06

## 2016-09-05 MED ORDER — PIPERACILLIN-TAZOBACTAM 3.375 G IVPB 30 MIN
3.3750 g | Freq: Four times a day (QID) | INTRAVENOUS | Status: DC
  Administered 2016-09-06: 3.375 g via INTRAVENOUS
  Filled 2016-09-05 (×3): qty 50

## 2016-09-05 MED ORDER — DEXTROSE 5 % IV SOLN
20.0000 g | INTRAVENOUS | Status: DC
  Administered 2016-09-05: 20 g via INTRAVENOUS_CENTRAL
  Filled 2016-09-05: qty 200

## 2016-09-05 NOTE — Procedures (Signed)
Central Venous Catheter Insertion Procedure Note Jillian Willis [id number] 07-17-1940  Procedure: Insertion of Central Venous Trialysis Catheter Indications: Hemodialysis  Procedure Details Consent: Risks of procedure as well as the alternatives and risks of each were explained to the (patient/caregiver).  Consent for procedure obtained. Time Out: Verified patient identification, verified procedure, site/side was marked, verified correct patient position, special equipment/implants available, medications/allergies/relevent history reviewed, required imaging and test results available.  Performed  Maximum sterile technique was used including antiseptics, cap, gloves, gown, hand hygiene, mask and sheet. Skin prep: Chlorhexidine; local anesthetic administered A antimicrobial bonded/coated triple lumen catheter was placed in the left femoral vein due to emergent situation using the Seldinger technique. We attempted to lie patient flat for a LIJ line but she developed VT and was unable to lie flat.  Evaluation Blood flow good Complications: No apparent complications Patient did tolerate procedure well.   Glori Bickers MD 09/05/2016, 5:12 PM

## 2016-09-05 NOTE — Consult Note (Signed)
Advanced Heart Failure Team Consult Note   PCP: Dr. Wilson Singer Primary Cardiologist:  Dr. Stanford Breed   Reason for Consultation: acute on chronic systolic CHF   HPI:    Jillian Willis is seen today for evaluation of acute on chronic systolic CHF at the request of Dr. Prescott Gum.   Jillian Willis is a 76 year old female with a past medical history of CAD(remote angioplasty to mid LAD in 2004), DM, HTN, HLD, and left breast CA.   She presented to her PCP's office in early May with complaints of fatigue, cough and dyspnea on exertion. Chest Xray showed bilateral pleural effusions, also was given antibiotics and an Echo was ordered. Her Echo showed an EF of 09-38%, grade 2 diastolic dysfunction. She was referred to her Cardiologist, Dr. Stanford Breed who saw her on 07/24/16. He started her on Coreg, continued her ARB, and added po lasix. Plan was for left heart cath, but her renal function was impaired, felt to be representative of volume overload, follow up was made for the next week with plans to recheck renal function.   She was seen on 07/28/16 at Dr. Jacalyn Lefevre office, remained volume overloaded on exam despite 80mg  po Lasix (she was eating out most meals however). She was seen on 07/30/16 in Dr. Jacalyn Lefevre office again. Her lasix was reduced due to worsening creatinine, ARB was stopped and hydralazine was ordered. She was set up for Myoview to assess for ischemia. Lexiscan myoview on 08/10/16 showed a large size, severe fixed anterior, septal, inferior and apical perfusion defect consistent with ischemia. She was set up for left heart cath.   Left heart cath on 08/20/16 showed severe multivessel CAD, with critical stenosis of the ostial LAD as well as the ostial circumflex and distal left main. RCA with severe stenosis as well. She was referred for an outpatient surgical consultation. She saw Dr. Prescott Gum on 08/25/16, cardiac MRI was planned to assess viability. She presented to Dr. Jacalyn Lefevre office on 09/15/2016 and  was feeling poorly, weight up 9 pounds, had nausea and vomiting. Also had chest pain and SOB. She was admitted, Dr. Prescott Gum saw her the same day and planned for urgent,  high risk CABG on 09/11/2016.   She underwent CABG x 3 (LIMA -LAD, SVG to Ramus, SVG to RCA) on 08/30/2016. She was hypotensive post operatively, CO was 4.1, index 1.9-2.1 on 0.375 mcg of milrinone, 5 mg dopamine and 20mg  Levo. She developed atrial fibrillation and was started on Amio gtt.      Review of Systems: [y] = yes, [ ]  = no   General: Weight gain Blue.Reese ]; Weight loss [ ] ; Anorexia [ ] ; Fatigue Blue.Reese ]; Fever [ ] ; Chills [ ] ; Weakness [ ]   Cardiac: Chest pain/pressure [ ] ; Resting SOB Blue.Reese ]; Exertional SOB [ ] ; Orthopnea [ ] ; Pedal Edema [ ] ; Palpitations [ ] ; Syncope [ ] ; Presyncope [ ] ; Paroxysmal nocturnal dyspnea[ ]   Pulmonary: Cough [ ] ; Wheezing[ ] ; Hemoptysis[ ] ; Sputum [ ] ; Snoring [ ]   GI: Vomiting[ ] ; Dysphagia[ ] ; Melena[ ] ; Hematochezia [ ] ; Heartburn[ ] ; Abdominal pain [ ] ; Constipation [ ] ; Diarrhea [ ] ; BRBPR [ ]   GU: Hematuria[ ] ; Dysuria [ ] ; Nocturia[ ]   Vascular: Pain in legs with walking [ ] ; Pain in feet with lying flat [ ] ; Non-healing sores [ ] ; Stroke [ ] ; TIA [ ] ; Slurred speech [ ] ;  Neuro: Headaches[ ] ; Vertigo[ ] ; Seizures[ ] ; Paresthesias[ ] ;Blurred vision [ ] ;  Diplopia [ ] ; Vision changes [ ]   Ortho/Skin: Arthritis [ ] ; Joint pain [ ] ; Muscle pain Blue.Reese ]; Joint swelling [ ] ; Back Pain [ ] ; Rash [ ]   Psych: Depression[ ] ; Anxiety[ ]   Heme: Bleeding problems [ ] ; Clotting disorders [ ] ; Anemia [ ]   Endocrine: Diabetes [ ] ; Thyroid dysfunction[ ]   Home Medications Prior to Admission medications   Medication Sig Start Date End Date Taking? Authorizing Provider  aspirin 81 MG tablet Take 81 mg by mouth daily.    Yes [provider]  carvedilol (COREG) 3.125 MG tablet Take 1 tablet (3.125 mg total) by mouth 2 (two) times daily. 07/24/16 10/22/16 Yes Lelon Perla, MD  Chlorphen-Pyril-Phenyleph  (TRIPLEX AD PO) Take 1 tablet by mouth daily.   Yes [provider]  Choline Fenofibrate (FENOFIBRIC ACID) 135 MG CPDR Take 135 mcg by mouth daily. 11/19/15  Yes [provider]  diclofenac sodium (VOLTAREN) 1 % GEL Apply 2 g topically 4 (four) times daily as needed (pain). Apply 2-4 grams to affected area 4 times a day  01/23/15  Yes [provider]  docusate sodium (COLACE) 100 MG capsule Take 1 capsule (100 mg total) by mouth every 12 (twelve) hours. Patient taking differently: Take 100 mg by mouth 2 (two) times daily as needed for moderate constipation.  12/13/15  Yes Lawyer, Harrell Gave, PA-C  escitalopram (LEXAPRO) 10 MG tablet Take 10 mg by mouth daily. Depression/anxiety.   Yes [provider]  furosemide (LASIX) 40 MG tablet Take 40 mg by mouth daily.   Yes [provider]  glipiZIDE (GLUCOTROL XL) 2.5 MG 24 hr tablet Take 2.5 mg by mouth 2 (two) times daily.  12/20/13  Yes [provider]  hydrALAZINE (APRESOLINE) 25 MG tablet Take 1 tablet (25 mg total) by mouth 3 (three) times daily. 07/30/16 10/28/16 Yes Burtis Junes, NP  isosorbide mononitrate (IMDUR) 30 MG 24 hr tablet Take 1 tablet (30 mg total) by mouth daily. 07/30/16  Yes Burtis Junes, NP  LEVEMIR FLEXTOUCH 100 UNIT/ML Pen 10 units in the morning and 20 units at night 03/12/16  Yes [provider]  liothyronine (CYTOMEL) 25 MCG tablet Take 25 mcg by mouth every morning.  07/08/13  Yes [provider]  rosuvastatin (CRESTOR) 10 MG tablet Take 10 mg by mouth daily.   Yes [provider]  SYNTHROID 125 MCG tablet Take 125 mcg by mouth daily.  12/20/13  Yes [provider]  ZETIA 10 MG tablet Take 10 mg by mouth daily.  07/06/13  Yes [provider]  bisacodyl (BISACODYL LAXATIVE) 10 MG suppository Place 1 suppository (10 mg total) rectally as needed for moderate constipation. 12/13/15   Lawyer, Harrell Gave, PA-C  lactulose (CHRONULAC) 10  GM/15ML solution Take 15 mLs (10 g total) by mouth 2 (two) times daily. Patient not taking: Reported on 08/25/2016 12/13/15   Dalia Heading, PA-C  nitroGLYCERIN (NITROSTAT) 0.4 MG SL tablet Place 1 tablet (0.4 mg total) under the tongue every 5 (five) minutes as needed. Patient taking differently: Place 0.4 mg under the tongue every 5 (five) minutes as needed for chest pain.  05/20/12   Lelon Perla, MD    Past Medical History: Past Medical History:  Diagnosis Date  . Arthritis    "right hip; left nee" (09/08/2016)  . Breast cancer in female [facility]) 2000   "left"  . CHF (congestive heart failure) ([place])   . CKD (chronic kidney disease), stage II   .  Depression   . GERD (gastroesophageal reflux disease)   . History of hiatal hernia   . Hyperlipidemia   . Hypertension   . Hypothyroidism   . IHD (ischemic heart disease)    s/p cath in 2009 showing heavily calcified left main with mild to moderate diffuse CAD. Remote angioplasty of the mid LAD in 2004.  Last myoview in 2012 felt to be low risk.   . Migraine    "none since breast reduction in ~ 2005" (08/29/2016)  . Myocardial infarction [facility])    "was told I've had one; don't know when" (09/20/2016)  . Obesity   . OSA on CPAP   . Pneumonia 1990s X 1  . Seasonal allergies   . Type II diabetes mellitus ([place])     Past Surgical History: Past Surgical History:  Procedure Laterality Date  . BREAST BIOPSY Left 2000  . BREAST LUMPECTOMY Left 2000  . CARDIAC CATHETERIZATION  06/29/2007   EF 50-55%  . CARDIAC CATHETERIZATION  11/14/2002   EF 55-60%  . CARDIAC CATHETERIZATION  05/09/2002   EF 45%  . CARDIAC CATHETERIZATION  07/2016  . CARDIOVASCULAR STRESS TEST  05/23/10   EF 68%, NO ISCHEMIA  . CATARACT EXTRACTION W/ INTRAOCULAR LENS  IMPLANT, BILATERAL Bilateral   . CHOLECYSTECTOMY OPEN  ~ 1980  . CORONARY ARTERY BYPASS GRAFT N/A 09/20/2016   Procedure: CORONARY ARTERY BYPASS GRAFTING (CABG), ON PUMP, TIMES THREE, USING LEFT INTERNAL  MAMMARY ARTERY AND ENDOSCOPICALLY HARVESTED LEFT GREATER SAPHENOUS VEIN;  Surgeon: Ivin Poot, MD;  Location: [place];  Service: Open Heart Surgery;  Laterality: N/A;  LIMA to LAD, SVG to RAMUS INTERMEDIATE, SVG to RCA  . DILATION AND CURETTAGE OF UTERUS  1970s X 1  . KNEE ARTHROSCOPY Left 1980s X 2  . REDUCTION MAMMAPLASTY Right 2005  . RIGHT/LEFT HEART CATH AND CORONARY ANGIOGRAPHY N/A 08/20/2016   Procedure: Right/Left Heart Cath and Coronary Angiography;  Surgeon: Sherren Mocha, MD;  Location: [place] CV LAB;  Service: Cardiovascular;  Laterality: N/A;  . SHOULDER ARTHROSCOPY W/ ROTATOR CUFF REPAIR Right   . TEE WITHOUT CARDIOVERSION N/A 09/02/2016   Procedure: TRANSESOPHAGEAL ECHOCARDIOGRAM (TEE);  Surgeon: Prescott Gum, Collier Salina, MD;  Location: [place];  Service: Open Heart Surgery;  Laterality: N/A;    Family History: Family History  Problem Relation Age of Onset  . Heart attack Mother   . Cancer Mother        Breast  . Heart disease Father   . Stroke Father     Social History: Social History   Social History  . Marital status: Married    Spouse name: N/A  . Number of children: N/A  . Years of education: N/A   Social History Main Topics  . Smoking status: Current Every Day Smoker    Years: 0.50    Types: Cigarettes    Last attempt to quit: 03/24/1960  . Smokeless tobacco: Never Used     Comment: 09/01/2016 "smoked a couple cigartettes/week when I did smoke"  . Alcohol use No  . Drug use: No  . Sexual activity: Not Currently   Other Topics Concern  . None   Social History Narrative  . None    Allergies:  Allergies  Allergen Reactions  . Peanut Oil Anaphylaxis  . Codeine     UNSPECIFIED REACTION   . Ciprofloxacin Diarrhea    Objective:    Vital Signs:   Temp:  [97 F (36.1 C)-98.2 F (36.8 C)] 97.3 F (36.3 C) (  06/15 0745) Pulse Rate:  [65-74] 70 (06/15 0700) Resp:  [13-23] 16 (06/15 0745) BP: (86-125)/(37-90) 113/84 (06/15 0600) SpO2:  [93 %-100  %] 97 % (06/15 0745) Arterial Line BP: (108-158)/(30-62) 128/39 (06/15 0745) FiO2 (%):  [40 %] 40 % (06/14 0920) Weight:  [216 lb 7.9 oz (98.2 kg)] 216 lb 7.9 oz (98.2 kg) (06/15 0500) Last BM Date:  (uknown - pt. intubated and sedated)  Weight change: Filed Weights   09/19/2016 0324 09/04/16 0500 09/05/16 0500  Weight: 197 lb 3.2 oz (89.4 kg) 212 lb 1.3 oz (96.2 kg) 216 lb 7.9 oz (98.2 kg)    Intake/Output:   Intake/Output Summary (Last 24 hours) at 09/05/16 0838 Last data filed at 09/05/16 0700  Gross per 24 hour  Intake          2667.22 ml  Output              942 ml  Net          1725.22 ml     Physical Exam: General: Elderly, ill appearing female, lying in bed.  HEENT: normal Neck: supple. JVP hard to assess with body habitus. Carotids 2+ bilat; no bruits. No lymphadenopathy or thyromegaly appreciated. Swan in L IJ.  Cor: PMI nondisplaced. Regular rate & rhythm. No rubs, gallops or murmurs. Lungs: Diffuse rhonchi, normal effort.  Abdomen: soft, nontender, + distended. No hepatosplenomegaly. No bruits or masses. Good bowel sounds. Extremities: no cyanosis, clubbing, rash. Generalized edema. 3+ pretibial edema on LLE, 1+ pretibial edema on RLE.  Neuro: Confused, not oriented to time or situation.  cranial nerves grossly intact. moves all 4 extremities w/o difficulty. Affect pleasant  Telemetry: atrial fib 70s     Labs: Basic Metabolic Panel:  Recent Labs Lab 09/10/2016 1035 09/01/2016 0552  08/25/2016 1416  09/02/2016 1539 09/01/2016 2117 09/15/2016 2119 09/04/16 0310 09/04/16 1618 09/04/16 1630 09/05/16 0328  NA 137 137  < > 139  < > 141 140  --  138  --  137 133*  K 3.8 3.4*  < > 3.4*  < > 3.3* 3.9  --  3.7  --  4.5 4.2  CL 102 102  < > 101  --   --  105  --  107  --  104 101  CO2 22 26  --   --   --   --   --   --  21*  --   --  17*  GLUCOSE 276* 47*  < > 108*  --  95 111*  --  122*  --  132* 217*  BUN 62* 63*  < > 45*  --   --  42*  --  45*  --  41* 50*  CREATININE  2.02* 2.22*  < > 1.70*  --   --  1.80* 1.89* 2.13* 2.43* 2.30* 3.04*  CALCIUM 8.9 8.3*  --   --   --   --   --   --  9.0  --   --  8.4*  MG 2.3  --   --   --   --   --   --  3.1* 2.8* 2.7*  --   --   < > = values in this interval not displayed.  Liver Function Tests:  Recent Labs Lab 09/16/2016 1035  AST 33  ALT 23  ALKPHOS 32*  BILITOT 0.5  PROT 7.1  ALBUMIN 3.4*    CBC:  Recent Labs Lab 09/11/2016 1035  09/18/2016 1531  09/19/2016 2119 09/04/16 0310 09/04/16 1618 09/04/16 1630 09/05/16 0328  WBC 6.0  < > 14.9*  --  10.8* 9.7 13.9*  --  16.1*  NEUTROABS 4.5  --   --   --   --   --   --   --   --   HGB 11.3*  < > 10.3*  < > 7.7* 7.4* 9.0* 8.5* 8.7*  HCT 36.1  < > 31.2*  < > 24.0* 22.5* 26.6* 25.0* 26.2*  MCV 97.3  < > 92.0  --  92.7 91.1 90.8  --  90.7  PLT 187  < > 115*  --  117* 112* 102*  --  101*  < > = values in this interval not displayed.  Cardiac Enzymes:  Recent Labs Lab 09/09/2016 1035 09/20/2016 1543 09/18/2016 2241  TROPONINI 0.22* 0.24* 0.20*    BNP: BNP (last 3 results)  Recent Labs  08/31/2016 1035  BNP 1,855.1*    ProBNP (last 3 results)  Recent Labs  07/28/16 1135  PROBNP 15,991*     CBG:  Recent Labs Lab 09/04/16 2038 09/04/16 2229 09/05/16 0019 09/05/16 0348 09/05/16 0753  GLUCAP 187* 228* 213* 204* 202*    Coagulation Studies:  Recent Labs  09/01/2016 1803 09/05/2016 1531  LABPROT 15.1 19.2*  INR 1.19 1.60      Imaging: Dg Chest Port 1 View  Result Date: 09/05/2016 CLINICAL DATA:  Chest tube, CABG. EXAM: PORTABLE CHEST 1 VIEW COMPARISON:  09/04/2016. FINDINGS: Interval extubation. Nasogastric tube has been removed as well. Right IJ Swan-Ganz catheter tip projects over the proximal right pulmonary artery. Right IJ central line is in the SVC. Mediastinal drain and left chest tube remain in place as do epicardial pacer wires. Seven intact sternotomy wires. Cardiomediastinal silhouette is enlarged, stable. Lungs are low in volume  with interstitial prominence and indistinctness. Small right pleural effusion. Improved aeration in the left lower lobe. Surgical clips in left axilla. IMPRESSION: 1. Congestive heart failure. 2. Improved aeration in the left lower lobe. Electronically Signed   By: Lorin Picket M.D.   On: 09/05/2016 07:57     Transthoracic Echocardiography 07/23/16 Study Conclusions  - Left ventricle: The cavity size was mildly dilated. Systolic   function was severely reduced. The estimated ejection fraction   was in the range of 20% to 25%. Diffuse hypokinesis. Features are   consistent with a pseudonormal left ventricular filling pattern,   with concomitant abnormal relaxation and increased filling   pressure (grade 2 diastolic dysfunction). Doppler parameters are   consistent with high ventricular filling pressure. - Aortic valve: Valve mobility was restricted. - Mitral valve: Calcified annulus. There was mild regurgitation. - Left atrium: The atrium was mildly dilated. - Pericardium, extracardiac: A trivial pericardial effusion was   identified.  Impressions:  - Definity used; severe global reduction in LV systolic function;   mild LVE; moderate diastolic dysfunction with elevated filing   pressure; mild MR; mild LAE.      Medications:     Current Medications: . acetaminophen  1,000 mg Oral Q6H   Or  . acetaminophen (TYLENOL) oral liquid 160 mg/5 mL  1,000 mg Per Tube Q6H  . amiodarone  200 mg Oral Daily  . aspirin EC  325 mg Oral Daily   Or  . aspirin  324 mg Per Tube Daily  . bisacodyl  10 mg Oral Daily   Or  . bisacodyl  10 mg Rectal Daily  . Chlorhexidine Gluconate Cloth  6  each Topical Q0600  . docusate sodium  200 mg Oral Daily  . escitalopram  10 mg Oral Daily  . ezetimibe  10 mg Oral Daily  . furosemide  80 mg Intravenous Daily  . insulin aspart  0-24 Units Subcutaneous Q4H  . lactulose  10 g Oral BID  . levothyroxine  100 mcg Oral QAC breakfast  . liothyronine   25 mcg Oral Daily  . mouth rinse  15 mL Mouth Rinse BID  . metoCLOPramide (REGLAN) injection  10 mg Intravenous Q6H  . metoprolol tartrate  12.5 mg Oral BID   Or  . metoprolol tartrate  12.5 mg Per Tube BID  . pantoprazole  40 mg Oral Daily  . rosuvastatin  10 mg Oral q1800  . sodium bicarbonate  50 mEq Intravenous Once  . sodium chloride flush  3 mL Intravenous Q12H     Infusions: . sodium chloride 10 mL/hr at 09/05/16 0700  . sodium chloride 250 mL (09/04/16 0624)  . sodium chloride Stopped (09/04/16 0700)  . albumin human    . DOPamine 5 mcg/kg/min (09/05/16 0700)  . lactated ringers    . lactated ringers 10 mL/hr at 09/05/16 0700  . milrinone 0.3 mcg/kg/min (09/05/16 0700)  . norepinephrine (LEVOPHED) Adult infusion        Assessment/Plan   1. CAD s/p CABG x 3 - POD #2 - CABG x 3 (LIMA -LAD, SVG to Ramus, SVG to RCA) by Dr. Prescott Gum on 09/02/2016. 2. Acute on chronic systolic CHF -> cardiogenic shock: ICM, EF 20-25%.  - Volume overloaded post op. PA diastolic 23.  - Co ox 63% on milrinone 0.3 mcg, dopa 58mcg and norepi 23 mcg - Start IV lasix 120mg  TID. Creatinine is 3.04. Will need higher dose to respond, renal consult pending.  - Hold metoprolol with multiple pressors.  - Co ox stable, C.I. 1.8, continue milrinone.  3. Leukocytosis  - WBC's trending up.  - consider drawing lactic acid 4. Atrial fibrillation, post-op - Transitioned to po Amio today.  - Currently NSR with V pacing.  5. Acute on chronic renal failure - Very poor urine output with progressive acidoisis 6. Morbid obesity 7. Acute hypoxic respiratory failure  Length of Stay: [place], NP  09/05/2016, 8:39 AM  Advanced Heart Failure Team Pager [phone] (M-F; 7a - 4p)  Please contact [facility] for night-coverage after hours (4p -7a ) and weekends on [email or web address]  Agree with above.   She is critically ill with multisystem organ failure with hypotension, renal failure and progressive  acidosis.   Co-ox ok this am but BP now worse. PH 7.2 with falling bicarb.   Now in AF. I turned off pacer as RV pacing worsens BP.   On exam Pale and lethargic Cor IRR Lungs + crackles Ab obese sluggish BS Ext cool 2-3+ edema  Will continue to titrate pressors to get SBP > 110 (has wide pulse pressure with low MAPs)  Have discussed with Drs. Prescott Gum and Deterding.   CCM contacted and trialysis catheter requested for CVVHD.  Prognosis guarded.   CRITICAL CARE Performed by: Glori Bickers  Total critical care time: 45 minutes  Critical care time was exclusive of separately billable procedures and treating other patients.  Critical care was necessary to treat or prevent imminent or life-threatening deterioration.  Critical care was time spent personally by me (independent of midlevel providers or residents) on the following activities: development of treatment plan with patient and/or surrogate as  well as nursing, discussions with consultants, evaluation of patient's response to treatment, examination of patient, obtaining history from patient or surrogate, ordering and performing treatments and interventions, ordering and review of laboratory studies, ordering and review of radiographic studies, pulse oximetry and re-evaluation of patient's condition.  Glori Bickers, MD  4:00 PM

## 2016-09-05 NOTE — Progress Notes (Signed)
Spoke w/Dr. Cyndia Bent regarding pt's low UOP. Discussed hemodynamics and labs. Per MD will continue watchful waiting so long as her hemodynamics stay stable on the current meds.   Will continue to monitor closely.

## 2016-09-05 NOTE — Progress Notes (Signed)
2 Days Post-Op Procedure(s) (LRB): CORONARY ARTERY BYPASS GRAFTING (CABG), ON PUMP, TIMES THREE, USING LEFT INTERNAL MAMMARY ARTERY AND ENDOSCOPICALLY HARVESTED LEFT GREATER SAPHENOUS VEIN (N/A) TRANSESOPHAGEAL ECHOCARDIOGRAM (TEE) (N/A) Subjective: Acute on chronic systolic HF Ischemic cardiomyopathy Postop acute on chronic renal failure Postop afib Extunated, neuro intact Objective: Vital signs in last 24 hours: Temp:  [97 F (36.1 C)-98.2 F (36.8 C)] 97.3 F (36.3 C) (06/15 0745) Pulse Rate:  [65-74] 70 (06/15 0700) Cardiac Rhythm: Normal sinus rhythm;Heart block (06/15 0745) Resp:  [13-23] 16 (06/15 0745) BP: (86-125)/(37-90) 113/84 (06/15 0600) SpO2:  [93 %-100 %] 97 % (06/15 0745) Arterial Line BP: (108-158)/(30-62) 128/39 (06/15 0745) FiO2 (%):  [40 %] 40 % (06/14 0920) Weight:  [216 lb 7.9 oz (98.2 kg)] 216 lb 7.9 oz (98.2 kg) (06/15 0500)  Hemodynamic parameters for last 24 hours: PAP: (23-42)/(11-38) 31/20 CO:  [2.9 L/min-4.5 L/min] 3.5 L/min CI:  [1.5 L/min/m2-2.3 L/min/m2] 1.8 L/min/m2  Intake/Output from previous day: 06/14 0701 - 06/15 0700 In: 2764.6 [P.O.:30; I.V.:1998; Blood:386.7; NG/GT:230; IV Piggyback:100] Out: 1032 [Urine:412; Chest Tube:620] Intake/Output this shift: No intake/output data recorded.       Exam    General- alert and comfortable   Lungs- diminished bs without rales, wheezes   Cor- a- paced rate and rhythm, no murmur , gallop   Abdomen- soft, non-tender   Extremities - warm, non-tender, minimal edema L leg   Neuro- oriented, appropriate, no focal weakness   Lab Results:  Recent Labs  09/04/16 1618 09/04/16 1630 09/05/16 0328  WBC 13.9*  --  16.1*  HGB 9.0* 8.5* 8.7*  HCT 26.6* 25.0* 26.2*  PLT 102*  --  101*   BMET:  Recent Labs  09/04/16 0310  09/04/16 1630 09/05/16 0328  NA 138  --  137 133*  K 3.7  --  4.5 4.2  CL 107  --  104 101  CO2 21*  --   --  17*  GLUCOSE 122*  --  132* 217*  BUN 45*  --  41* 50*   CREATININE 2.13*  < > 2.30* 3.04*  CALCIUM 9.0  --   --  8.4*  < > = values in this interval not displayed.  PT/INR:  Recent Labs  09/13/2016 1531  LABPROT 19.2*  INR 1.60   ABG    Component Value Date/Time   PHART 7.235 (L) 09/05/2016 0235   HCO3 15.4 (L) 09/05/2016 0235   TCO2 23 09/04/2016 1630   ACIDBASEDEF 10.7 (H) 09/05/2016 0235   O2SAT 92.1 09/05/2016 0235   CBG (last 3)   Recent Labs  09/05/16 0019 09/05/16 0348 09/05/16 0753  GLUCAP 213* 204* 202*    Assessment/Plan: S/P Procedure(s) (LRB): CORONARY ARTERY BYPASS GRAFTING (CABG), ON PUMP, TIMES THREE, USING LEFT INTERNAL MAMMARY ARTERY AND ENDOSCOPICALLY HARVESTED LEFT GREATER SAPHENOUS VEIN (N/A) TRANSESOPHAGEAL ECHOCARDIOGRAM (TEE) (N/A) Mobilize Diabetes control Renal consult Adv HF consult Cont mil, renal dop, wean NE  LOS: 2 days    Jillian Willis 09/05/2016

## 2016-09-05 NOTE — Procedures (Signed)
.  Endotracheal Intubation Procedure Note Indication for endotracheal intubation: impending respiratory failure Sedation: etomidate and midazolam Paralytic: succinylcholine Equipment: Macintosh 3 laryngoscope blade and 7.70m cuffed endotracheal tube Cricoid Pressure: yes Number of attempts: 1 ETT location confirmed by by auscultation, by CXR and ETCO2 monitor    Patient with severe shock and worsening lactic acidosis, lethargic and c/o SOB on CPAP. Concern she is tiring out. Verbal consent for intubation per patient and per her husband who is next-of-kin. Timeout performed. Monitored on continuous telemetry and continuous BP and Pox throughout procedure. Preoxygenated with 100% Ambu bag. Sedated with 228mVersed, 3063mtomidate, 100m50mcc. Intubated with 3 Mac blade and 7.5mm 46m. Grade 1 view of cords. Following ETT placement, good color change on CO2 detector, humidification within ETT, equal breath sounds. ETT cuff inflated and tube secured in place. CXR pending. No complications. Tolerated procedure well. No blood loss.

## 2016-09-05 NOTE — Consult Note (Signed)
PULMONARY / CRITICAL CARE MEDICINE   Name: Jillian Willis MRN: [id number] DOB: [birth year]    ADMISSION DATE:  09/09/2016 CONSULTATION DATE:  09/05/16  REFERRING MD:  Dr Haroldine Laws  CHIEF COMPLAINT:  Acute respiratory failure  HISTORY OF PRESENT ILLNESS:   76yoF with CAD s/p CABG, CHF (EF 25%), Breast cancer, HTN, GERD, Depression, OSA on CPAP, DM, who is s/p angioplasty to LAD in 2004, s/p recent cardiac cath on 08/20/16, now admitted 09/14/2016 with c/o N/V, CP, Increasing Pulmonary edema, SOB, and found to have AKI-on-CKD at that time. She underwent CABG on 09/09/2016 and since then has had shock and continues to have worsening AKI. Over the past 24 hours, she went from requiring levophed @ 35mcg to now levophed @ 19mcg, epinephrine @ 20, Vaso @ 0.03, and Dopa @ 5. Her hospital course has also been complicated by Afib controlled on Amiodarone gtt. This evening, I was called to see patient as she was c/o feeling more SOB despite CPAP.   When I arrived, patient is lethargic but arousable to voice. She c/o SOB and Wheezing. Denies cough or CP. She says she feels tired. She is agreeable to intubation. Several family members are present and after discussion with patient, they are all in agreement.    PAST MEDICAL HISTORY :  She  has a past medical history of Arthritis; Breast cancer in female [facility]) (2000); CHF (congestive heart failure) ([place]); CKD (chronic kidney disease), stage II; Depression; GERD (gastroesophageal reflux disease); History of hiatal hernia; Hyperlipidemia; Hypertension; Hypothyroidism; IHD (ischemic heart disease); Migraine; Myocardial infarction ([place]); Obesity; OSA on CPAP; Pneumonia (1990s X 1); Seasonal allergies; and Type II diabetes mellitus ([place]).  PAST SURGICAL HISTORY: She  has a past surgical history that includes Cardiovascular stress test (05/23/10); Right/Left Heart Cath and Coronary Angiography (N/A, 08/20/2016); Cholecystectomy open (~ 1980); Knee arthroscopy (Left, 1980s X 2);  Breast biopsy (Left, 2000); Reduction mammaplasty (Right, 2005); Breast lumpectomy (Left, 2000); Shoulder arthroscopy w/ rotator cuff repair (Right); Cataract extraction w/ intraocular lens  implant, bilateral (Bilateral); Dilation and curettage of uterus (1970s X 1); Cardiac catheterization (06/29/2007); Cardiac catheterization (11/14/2002); Cardiac catheterization (05/09/2002); Cardiac catheterization (07/2016); Coronary artery bypass graft (N/A, 09/16/2016); and TEE without cardioversion (N/A, 08/24/2016).  Allergies  Allergen Reactions  . Peanut Oil Anaphylaxis  . Codeine     UNSPECIFIED REACTION   . Ciprofloxacin Diarrhea    No current facility-administered medications on file prior to encounter.    Current Outpatient Prescriptions on File Prior to Encounter  Medication Sig  . aspirin 81 MG tablet Take 81 mg by mouth daily.   . carvedilol (COREG) 3.125 MG tablet Take 1 tablet (3.125 mg total) by mouth 2 (two) times daily.  . Chlorphen-Pyril-Phenyleph (TRIPLEX AD PO) Take 1 tablet by mouth daily.  . Choline Fenofibrate (FENOFIBRIC ACID) 135 MG CPDR Take 135 mcg by mouth daily.  . diclofenac sodium (VOLTAREN) 1 % GEL Apply 2 g topically 4 (four) times daily as needed (pain). Apply 2-4 grams to affected area 4 times a day   . docusate sodium (COLACE) 100 MG capsule Take 1 capsule (100 mg total) by mouth every 12 (twelve) hours. (Patient taking differently: Take 100 mg by mouth 2 (two) times daily as needed for moderate constipation. )  . escitalopram (LEXAPRO) 10 MG tablet Take 10 mg by mouth daily. Depression/anxiety.  . furosemide (LASIX) 40 MG tablet Take 40 mg by mouth daily.  Marland Kitchen glipiZIDE (GLUCOTROL XL) 2.5 MG 24 hr tablet Take 2.5 mg by  mouth 2 (two) times daily.   . hydrALAZINE (APRESOLINE) 25 MG tablet Take 1 tablet (25 mg total) by mouth 3 (three) times daily.  . isosorbide mononitrate (IMDUR) 30 MG 24 hr tablet Take 1 tablet (30 mg total) by mouth daily.  Marland Kitchen LEVEMIR FLEXTOUCH 100 UNIT/ML  Pen 10 units in the morning and 20 units at night  . liothyronine (CYTOMEL) 25 MCG tablet Take 25 mcg by mouth every morning.   . rosuvastatin (CRESTOR) 10 MG tablet Take 10 mg by mouth daily.  Marland Kitchen SYNTHROID 125 MCG tablet Take 125 mcg by mouth daily.   Marland Kitchen ZETIA 10 MG tablet Take 10 mg by mouth daily.   . bisacodyl (BISACODYL LAXATIVE) 10 MG suppository Place 1 suppository (10 mg total) rectally as needed for moderate constipation.  Marland Kitchen lactulose (CHRONULAC) 10 GM/15ML solution Take 15 mLs (10 g total) by mouth 2 (two) times daily. (Patient not taking: Reported on 08/24/2016)  . nitroGLYCERIN (NITROSTAT) 0.4 MG SL tablet Place 1 tablet (0.4 mg total) under the tongue every 5 (five) minutes as needed. (Patient taking differently: Place 0.4 mg under the tongue every 5 (five) minutes as needed for chest pain. )    FAMILY HISTORY:  Her indicated that her mother is deceased. She indicated that her father is deceased. She indicated that her maternal grandmother is deceased. She indicated that her maternal grandfather is deceased. She indicated that her paternal grandmother is deceased. She indicated that her paternal grandfather is deceased.    SOCIAL HISTORY: She  reports that she has been smoking Cigarettes.  She has smoked for the past 0.50 years. She has never used smokeless tobacco. She reports that she does not drink alcohol or use drugs.  REVIEW OF SYSTEMS:   Full 13 point ROS unable to be obtained due to patient's respiratory distress  SUBJECTIVE:  SOB  VITAL SIGNS: BP (!) 39/27   Pulse 70   Temp (!) 92.3 F (33.5 C) (Rectal)   Resp 15   Wt 98.2 kg (216 lb 7.9 oz)   SpO2 100%   BMI 39.60 kg/m   HEMODYNAMICS: PAP: (22-40)/(11-31) 34/28 CVP:  [13 mmHg-22 mmHg] 19 mmHg CO:  [3.5 L/min-3.9 L/min] 3.5 L/min CI:  [1.8 L/min/m2-2.1 L/min/m2] 1.8 L/min/m2  VENTILATOR SETTINGS: Vent Mode: BIPAP;PCV FiO2 (%):  [40 %] 40 % Set Rate:  [10 bmp] 10 bmp PEEP:  [5 cmH20] 5 cmH20  INTAKE /  OUTPUT: I/O last 3 completed shifts: In: 3596.1 [P.O.:30; I.V.:2697.4; Blood:386.7; Other:40; NG/GT:230; IV Piggyback:212] Out: 2330 [Urine:526; Chest Tube:820]  PHYSICAL EXAMINATION: General: Elderly obese WF on CPAP full face mask, lethargic appearing, hypothermic to 92.3 Neuro: somnolent but arousable to voice and is oriented x 3.  HEENT: edentulous; MM moist. OP clear. PERRL Cardiovascular: Irreg Irreg with rate of 100 Lungs: Coarse breath sound b/l; Left chest tube to suction with no output and no air leak Abdomen: Obese, soft, NTND, BS hypoactive Musculoskeletal: no clubbing or cyanosis. 1+ BUE and BLE edema Skin: sternal wound clean and covered with dry dressing  LABS:  BMET  Recent Labs Lab 09/04/16 0310  09/05/16 0328  09/05/16 1709  09/05/16 2208 09/05/16 2315 09/05/16 2320  NA 138  < > 133*  < > 135  < > 137 136 139  K 3.7  < > 4.2  < > 4.2  < > 3.8 3.7 3.7  CL 107  < > 101  < > 99*  < > 97* 96* 96*  CO2 21*  --  17*  --  19*  --   --   --   --   BUN 45*  < > 50*  < > 54*  < > 36* 34* 28*  CREATININE 2.13*  < > 3.04*  < > 3.56*  < > 2.60* 2.30* 1.60*  GLUCOSE 122*  < > 217*  < > 187*  < > 205* 249* 248*  < > = values in this interval not displayed.  Electrolytes  Recent Labs Lab 09/16/2016 2119 09/04/16 0310 09/04/16 1618 09/05/16 0328 09/05/16 1709  CALCIUM  --  9.0  --  8.4* 8.4*  MG 3.1* 2.8* 2.7*  --   --   PHOS  --   --   --   --  6.4*    CBC  Recent Labs Lab 09/05/16 1517  09/05/16 1744  09/05/16 2308 09/05/16 2315 09/05/16 2320  WBC 15.4*  --  13.9*  --  9.4  --   --   HGB 8.2*  < > 8.0*  < > 8.1* 11.6* 9.2*  HCT 24.4*  < > 24.2*  < > 24.5* 34.0* 27.0*  PLT 89*  --  89*  --  PENDING  --   --   < > = values in this interval not displayed.  Coag's  Recent Labs Lab 09/13/2016 1803 09/08/2016 2241 09/05/2016 1531  APTT  --  29 32  INR 1.19  --  1.60    Sepsis Markers  Recent Labs Lab 09/05/16 1558 09/05/16 1821 09/05/16 2303   LATICACIDVEN 5.8* 8.8* 11.28*    ABG  Recent Labs Lab 09/05/16 1518 09/05/16 2033 09/05/16 2311  PHART 7.232* 7.301* 7.238*  PCO2ART 40.5 33.5 35.2  PO2ART 84.0 95.0 121.0*    Liver Enzymes  Recent Labs Lab 09/20/2016 1035 09/05/16 1709  AST 33  --   ALT 23  --   ALKPHOS 32*  --   BILITOT 0.5  --   ALBUMIN 3.4* 3.2*    Cardiac Enzymes  Recent Labs Lab 09/17/2016 1035 09/05/2016 1543 08/26/2016 2241  TROPONINI 0.22* 0.24* 0.20*    Glucose  Recent Labs Lab 09/04/16 2229 09/05/16 0019 09/05/16 0348 09/05/16 0753 09/05/16 1122 09/05/16 1943  GLUCAP 228* 213* 204* 202* 185* 135*    Imaging Dg Chest Port 1 View  Result Date: 09/05/2016 CLINICAL DATA:  Chest tube, CABG. EXAM: PORTABLE CHEST 1 VIEW COMPARISON:  09/04/2016. FINDINGS: Interval extubation. Nasogastric tube has been removed as well. Right IJ Swan-Ganz catheter tip projects over the proximal right pulmonary artery. Right IJ central line is in the SVC. Mediastinal drain and left chest tube remain in place as do epicardial pacer wires. Seven intact sternotomy wires. Cardiomediastinal silhouette is enlarged, stable. Lungs are low in volume with interstitial prominence and indistinctness. Small right pleural effusion. Improved aeration in the left lower lobe. Surgical clips in left axilla. IMPRESSION: 1. Congestive heart failure. 2. Improved aeration in the left lower lobe. Electronically Signed   By: Lorin Picket M.D.   On: 09/05/2016 07:57   ANTIBIOTICS: Cefuroxime  LINES/TUBES: Left Femoral Dialysis catheter Right Femoral A-line RIJ Introducer catheter Left-sided chest tube    ASSESSMENT / PLAN: 13yoF with CAD now s/p CABG (on 6/13), CHF (EF 25% on TEE 6/13), as well as history of HTN, DM, OSA, GERD, breast cancer, and CKD, now with AKI-on-CKD requiring CRRT, Severe shock (unclear if septic vs cardiogenic vs both), severe lactic acidosis that continues to worsen, with impending respiratory failure  with hypoxia due  to the shock.   PULMONARY 1. Acute hypoxic respiratory failure; Pulmonary edema: - appeared to be "tiring out" on the CPAP; ABG showed metabolic acidosis but no hypercapnea - intubated (see separate note); CXR on my review confirms correct ETT placement. Additionally shows pulmonary edema and bibasilar atelectasis but cannot rule out possible bibasilar infiltrates - I appreciate the another provider had ordered Chest CT, but she is not stable to leave ICU for it at this time; re-eval in AM - recheck ABG following intubation and adjust vent as tolerated - place OG tube; defer Tubefeeds until the AM - continue GI prophylaxis; SCD's for DVT prophylaxis (avoiding anticoag due to worsening anemia) - attempted to pull fluid with CRRT have been limited by her hypotension  CARDIOVASCULAR 1. Shock: presumed at least some component of cardiogenic shock.  - continue current 4 vasopressors - will ask Cards if they want to perform another Echocardiogram; at very least would want to rule out any tamponade causing this shock that has been worsening over past 24hrs. Will also ask Cards if she is a candidate for a IABP - check new Trop  2. Atrial fibrillation: - rate in low 100's on Amiodarone gtt  RENAL 1. AKI-on-CKD; Metabolic acidosis: - on CRRT but unable to pull any fluid due to her severe shock - continue bicarb gtt  HEMATOLOGIC 1. Anemia: unclear etiology; continues to worsen but per RN there has been no clinical blood loss - recheck Hgb tonight is 8.1, stable from 8.0 earlier today.   2. Thrombocytopenia: Platelets 187 on admission on 6/12, now down to 89; unclear if possibly consumptive vs due to dialysis - continue to monitor closely; if drops further consider further workup  INFECTIOUS 1. Shock: not clear if all cardiogenic or if some component of septic shock - continue 4 vasopressors (levophed, epinephrine, dopamine, and vasopression) - start hydrocortisone 100mg  IV  q8hrs - give Albumin bolus x 1 - CVP 16-22 - obtain blood cultures, UA, Sputum culture, Procalcitonin - Lactate 11.8, up form 8.8 earlier this evening; will continue to trend  ENDOCRINE 1. DM:  - hyperglycemia worsening since starting D5-Bicarb gtt; will change the Bicarb gtt to Free water instead of D5 - SSI q4hrs    FAMILY  - Multiple family members updated at bedside   Vernie Murders, MD Pulmonary and [place] Pager: [phone]  90 minutes nonprocedural critical care time  09/05/2016, 11:59 PM

## 2016-09-05 NOTE — Progress Notes (Signed)
Pharmacy Antibiotic Note  Jillian Willis is a 76 y.o. female admitted on 09/04/2016 with acute on chronic CHF and sepsis.  Pharmacy has been consulted for Vancocin and Zosyn dosing.  Noted that pt was started on CRRT.  Plan: Vancomycin 2000mg  x1 then 1000mg  IV every 24 hours.  Goal trough 15-20 mcg/mL.  Zosyn 3.375g IV every 6 hours.  Weight: 216 lb 7.9 oz (98.2 kg)  Temp (24hrs), Avg:97.5 F (36.4 C), Min:92.3 F (33.5 C), Max:97.9 F (36.6 C)   Recent Labs Lab 09/04/16 0310 09/04/16 1618 09/04/16 1630 09/05/16 0328 09/05/16 1517 09/05/16 1558 09/05/16 1641 09/05/16 1709 09/05/16 1744 09/05/16 1821  WBC 9.7 13.9*  --  16.1* 15.4*  --   --   --  13.9*  --   CREATININE 2.13* 2.43* 2.30* 3.04*  --   --  2.80* 3.56*  --   --   LATICACIDVEN  --   --   --   --   --  5.8*  --   --   --  8.8*    Estimated Creatinine Clearance: 15.2 mL/min (A) (by C-G formula based on SCr of 3.56 mg/dL (H)).    Allergies  Allergen Reactions  . Peanut Oil Anaphylaxis  . Codeine     UNSPECIFIED REACTION   . Ciprofloxacin Diarrhea     Thank you for allowing pharmacy to be a part of this patient's care.  Wynona Neat, PharmD, BCPS  09/05/2016 11:11 PM

## 2016-09-05 NOTE — Progress Notes (Signed)
Inpatient Diabetes Program Recommendations  AACE/ADA: New Consensus Statement on Inpatient Glycemic Control (2015)  Target Ranges:  Prepandial:   less than 140 mg/dL      Peak postprandial:   less than 180 mg/dL (1-2 hours)      Critically ill patients:  140 - 180 mg/dL   Lab Results  Component Value Date   GLUCAP 202 (H) 09/05/2016   HGBA1C 9.3 (H) 09/05/2016    Review of Glycemic ControlResults for AALIAYAH, MIAO (MRN [id number]) as of 09/05/2016 09:24  Ref. Range 09/04/2016 22:29 09/05/2016 00:19 09/05/2016 03:48 09/05/2016 07:53  Glucose-Capillary Latest Ref Range: 65 - 99 mg/dL 228 (H) 213 (H) 204 (H) 202 (H)   Diabetes history: Type 2 diabetes Outpatient Diabetes medications: Glucotrol 2.5 mg daily Current orders for Inpatient glycemic control:  TCTS q 4 hours  Inpatient Diabetes Program Recommendations:   Please consider adding Lantus 10 units daily.  Also may consider decreasing Novolog to moderate q 4 hours.  Thanks, Adah Perl, RN, BC-ADM Inpatient Diabetes Coordinator Pager [phone] (8a-5p)

## 2016-09-05 NOTE — Progress Notes (Signed)
Pharmacist Heart Failure Core Measure Documentation  Assessment: Jillian Willis has an EF documented as 20-25% on 07/23/16 by Echo.  Rationale: Heart failure patients with left ventricular systolic dysfunction (LVSD) and an EF < 40% should be prescribed an angiotensin converting enzyme inhibitor (ACEI) or angiotensin receptor blocker (ARB) at discharge unless a contraindication is documented in the medical record.  This patient is not currently on an ACEI or ARB for HF.  This note is being placed in the record in order to provide documentation that a contraindication to the use of these agents is present for this encounter.  ACE Inhibitor or Angiotensin Receptor Blocker is contraindicated (specify all that apply)  []   ACEI allergy AND ARB allergy []   Angioedema []   Moderate or severe aortic stenosis []   Hyperkalemia []   Hypotension []   Renal artery stenosis [x]   Worsening renal function, preexisting renal disease or dysfunction  Hildred Laser, Pharm D 09/05/2016 10:59 AM

## 2016-09-05 NOTE — Consult Note (Signed)
Reason for Consult:CKD4, AKI Referring Physician: Dr.Van Yanai Jillian Willis is an 76 y.o. female.  HPI: 76 yr female with hx long term DM, HTN, OSA , obesity, known CAD (past stent), CM with EF of 20%, hypothyroid, Depression now 2 d post CABG.  Saw Dr. 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  Baseline Cr 2.2.  Presented 6/12 with N,V, worsening CHF, felt to be anginal equivalent.  Cath had been done 5/30.  U/S had shown small kidneys and scarring.  Cr now up to 3.04, 2.3 yest.  She is confused and cannot give Hx. Review of systems not obtained due to patient factors.    . Primary Nephrologist Hollie Salk.    Past Medical History:  Diagnosis Date  . Arthritis    "right hip; left nee" (08/30/2016)  . Breast cancer in female [facility]) 2000   "left"  . CHF (congestive heart failure) ([place])   . CKD (chronic kidney disease), stage II   . Depression   . GERD (gastroesophageal reflux disease)   . History of hiatal hernia   . Hyperlipidemia   . Hypertension   . Hypothyroidism   . IHD (ischemic heart disease)    s/p cath in 2009 showing heavily calcified left main with mild to moderate diffuse CAD. Remote angioplasty of the mid LAD in 2004.  Last myoview in 2012 felt to be low risk.   . Migraine    "none since breast reduction in ~ 2005" (09/15/2016)  . Myocardial infarction [facility])    "was told I've had one; don't know when" (08/29/2016)  . Obesity   . OSA on CPAP   . Pneumonia 1990s X 1  . Seasonal allergies   . Type II diabetes mellitus ([place])     Past Surgical History:  Procedure Laterality Date  . BREAST BIOPSY Left 2000  . BREAST LUMPECTOMY Left 2000  . CARDIAC CATHETERIZATION  06/29/2007   EF 50-55%  . CARDIAC CATHETERIZATION  11/14/2002   EF 55-60%  . CARDIAC CATHETERIZATION  05/09/2002   EF 45%  . CARDIAC CATHETERIZATION  07/2016  . CARDIOVASCULAR STRESS TEST  05/23/10   EF 68%, NO ISCHEMIA  . CATARACT EXTRACTION W/ INTRAOCULAR LENS  IMPLANT, BILATERAL Bilateral   .  CHOLECYSTECTOMY OPEN  ~ 1980  . CORONARY ARTERY BYPASS GRAFT N/A 09/17/2016   Procedure: CORONARY ARTERY BYPASS GRAFTING (CABG), ON PUMP, TIMES THREE, USING LEFT INTERNAL MAMMARY ARTERY AND ENDOSCOPICALLY HARVESTED LEFT GREATER SAPHENOUS VEIN;  Surgeon: Ivin Poot, MD;  Location: [place];  Service: Open Heart Surgery;  Laterality: N/A;  LIMA to LAD, SVG to RAMUS INTERMEDIATE, SVG to RCA  . DILATION AND CURETTAGE OF UTERUS  1970s X 1  . KNEE ARTHROSCOPY Left 1980s X 2  . REDUCTION MAMMAPLASTY Right 2005  . RIGHT/LEFT HEART CATH AND CORONARY ANGIOGRAPHY N/A 08/20/2016   Procedure: Right/Left Heart Cath and Coronary Angiography;  Surgeon: Sherren Mocha, MD;  Location: [place] [facility];  Service: Cardiovascular;  Laterality: N/A;  . SHOULDER ARTHROSCOPY W/ ROTATOR CUFF REPAIR Right   . TEE WITHOUT CARDIOVERSION N/A 09/01/2016   Procedure: TRANSESOPHAGEAL ECHOCARDIOGRAM (TEE);  Surgeon: Prescott Gum, Collier Salina, MD;  Location: [place];  Service: Open Heart Surgery;  Laterality: N/A;    Family History  Problem Relation Age of Onset  . Heart attack Mother   . Cancer Mother        Breast  . Heart disease Father   . Stroke Father     Social  History:  reports that she has been smoking Cigarettes.  She has smoked for the past 0.50 years. She has never used smokeless tobacco. She reports that she does not drink alcohol or use drugs.  Allergies:  Allergies  Allergen Reactions  . Peanut Oil Anaphylaxis  . Codeine     UNSPECIFIED REACTION   . Ciprofloxacin Diarrhea    Medications:  I have reviewed the patient's current medications. Prior to Admission:  Prescriptions Prior to Admission  Medication Sig Dispense Refill Last Dose  . aspirin 81 MG tablet Take 81 mg by mouth daily.    08/31/2016 at Unknown time  . carvedilol (COREG) 3.125 MG tablet Take 1 tablet (3.125 mg total) by mouth 2 (two) times daily. 180 tablet 3 08/31/2016 at 0600  . Chlorphen-Pyril-Phenyleph (TRIPLEX AD PO) Take 1 tablet by  mouth daily.   09/19/2016 at Unknown time  . Choline Fenofibrate (FENOFIBRIC ACID) 135 MG CPDR Take 135 mcg by mouth daily.   09/18/2016 at Unknown time  . diclofenac sodium (VOLTAREN) 1 % GEL Apply 2 g topically 4 (four) times daily as needed (pain). Apply 2-4 grams to affected area 4 times a day   1 09/01/2016 at Unknown time  . docusate sodium (COLACE) 100 MG capsule Take 1 capsule (100 mg total) by mouth every 12 (twelve) hours. (Patient taking differently: Take 100 mg by mouth 2 (two) times daily as needed for moderate constipation. ) 60 capsule 0 PRN  . escitalopram (LEXAPRO) 10 MG tablet Take 10 mg by mouth daily. Depression/anxiety.   08/30/2016 at Unknown time  . furosemide (LASIX) 40 MG tablet Take 40 mg by mouth daily.   09/07/2016 at Unknown time  . glipiZIDE (GLUCOTROL XL) 2.5 MG 24 hr tablet Take 2.5 mg by mouth 2 (two) times daily.   0 08/29/2016 at Unknown time  . hydrALAZINE (APRESOLINE) 25 MG tablet Take 1 tablet (25 mg total) by mouth 3 (three) times daily. 90 tablet 3 09/16/2016 at Unknown time  . isosorbide mononitrate (IMDUR) 30 MG 24 hr tablet Take 1 tablet (30 mg total) by mouth daily. 30 tablet 6 09/01/2016 at Unknown time  . LEVEMIR FLEXTOUCH 100 UNIT/ML Pen 10 units in the morning and 20 units at night   08/25/2016 at Unknown time  . liothyronine (CYTOMEL) 25 MCG tablet Take 25 mcg by mouth every morning.    08/30/2016 at Unknown time  . rosuvastatin (CRESTOR) 10 MG tablet Take 10 mg by mouth daily.   09/01/2016 at Unknown time  . SYNTHROID 125 MCG tablet Take 125 mcg by mouth daily.   5 08/28/2016 at Unknown time  . ZETIA 10 MG tablet Take 10 mg by mouth daily.    09/11/2016 at Unknown time  . bisacodyl (BISACODYL LAXATIVE) 10 MG suppository Place 1 suppository (10 mg total) rectally as needed for moderate constipation. 12 suppository 0 PRN  . lactulose (CHRONULAC) 10 GM/15ML solution Take 15 mLs (10 g total) by mouth 2 (two) times daily. (Patient not taking: Reported on 08/24/2016) 240  mL 0 Not Taking at Unknown time  . nitroGLYCERIN (NITROSTAT) 0.4 MG SL tablet Place 1 tablet (0.4 mg total) under the tongue every 5 (five) minutes as needed. (Patient taking differently: Place 0.4 mg under the tongue every 5 (five) minutes as needed for chest pain. ) 25 tablet 6 PRN    Results for orders placed or performed during the hospital encounter of 09/16/2016 (from the past 48 hour(s))  POCT I-Stat EG7  Status: Abnormal   Collection Time: 08/26/2016  2:38 PM  Result Value Ref Range   pH, Ven 7.471 (H) 7.250 - 7.430   pCO2, Ven 34.7 (Willis) 44.0 - 60.0 mmHg   pO2, Ven 32.0 32.0 - 45.0 mmHg   Bicarbonate 25.3 20.0 - 28.0 mmol/Willis   TCO2 26 0 - 100 mmol/Willis   O2 Saturation 67.0 %   Acid-Base Excess 2.0 0.0 - 2.0 mmol/Willis   Sodium 142 135 - 145 mmol/Willis   Potassium 3.3 (Willis) 3.5 - 5.1 mmol/Willis   Calcium, Ion 1.22 1.15 - 1.40 mmol/Willis   HCT 29.0 (Willis) 36.0 - 46.0 %   Hemoglobin 9.9 (Willis) 12.0 - 15.0 g/dL   Patient temperature HIDE    Sample type CARDIOPULMONARY BYPASS    Comment NOTIFIED PHYSICIAN   CBC     Status: Abnormal   Collection Time: 09/04/2016  3:31 PM  Result Value Ref Range   WBC 14.9 (H) 4.0 - 10.5 K/uL   RBC 3.39 (Willis) 3.87 - 5.11 MIL/uL   Hemoglobin 10.3 (Willis) 12.0 - 15.0 g/dL   HCT 31.2 (Willis) 36.0 - 46.0 %   MCV 92.0 78.0 - 100.0 fL   MCH 30.4 26.0 - 34.0 pg   MCHC 33.0 30.0 - 36.0 g/dL   RDW 15.7 (H) 11.5 - 15.5 %   Platelets 115 (Willis) 150 - 400 K/uL    Comment: CONSISTENT WITH PREVIOUS RESULT  Protime-INR     Status: Abnormal   Collection Time: 09/08/2016  3:31 PM  Result Value Ref Range   Prothrombin Time 19.2 (H) 11.4 - 15.2 seconds   INR 1.60   APTT     Status: None   Collection Time: 08/29/2016  3:31 PM  Result Value Ref Range   aPTT 32 24 - 36 seconds  I-STAT 4, (NA,K, GLUC, HGB,HCT)     Status: Abnormal   Collection Time: 09/20/2016  3:39 PM  Result Value Ref Range   Sodium 141 135 - 145 mmol/Willis   Potassium 3.3 (Willis) 3.5 - 5.1 mmol/Willis   Glucose, Bld 95 65 - 99 mg/dL   HCT 30.0 (Willis)  36.0 - 46.0 %   Hemoglobin 10.2 (Willis) 12.0 - 15.0 g/dL  I-STAT 3, arterial blood gas (G3+)     Status: None   Collection Time: 09/14/2016  4:05 PM  Result Value Ref Range   pH, Arterial 7.382 7.350 - 7.450   pCO2 arterial 43.2 32.0 - 48.0 mmHg   pO2, Arterial 99.0 83.0 - 108.0 mmHg   Bicarbonate 26.1 20.0 - 28.0 mmol/Willis   TCO2 27 0 - 100 mmol/Willis   O2 Saturation 98.0 %   Patient temperature 35.4 C    Sample type ARTERIAL   Glucose, capillary     Status: None   Collection Time: 09/10/2016  6:58 PM  Result Value Ref Range   Glucose-Capillary 70 65 - 99 mg/dL   Comment 1 Arterial Specimen   Glucose, capillary     Status: None   Collection Time: 09/04/2016  7:23 PM  Result Value Ref Range   Glucose-Capillary 97 65 - 99 mg/dL  Glucose, capillary     Status: Abnormal   Collection Time: 09/18/2016  8:25 PM  Result Value Ref Range   Glucose-Capillary 102 (H) 65 - 99 mg/dL   Comment 1 Arterial Specimen   Glucose, capillary     Status: Abnormal   Collection Time: 09/08/2016  9:15 PM  Result Value Ref Range   Glucose-Capillary 118 (H) 65 -  99 mg/dL   Comment 1 Arterial Specimen   I-STAT, chem 8     Status: Abnormal   Collection Time: 08/27/2016  9:17 PM  Result Value Ref Range   Sodium 140 135 - 145 mmol/Willis   Potassium 3.9 3.5 - 5.1 mmol/Willis   Chloride 105 101 - 111 mmol/Willis   BUN 42 (H) 6 - 20 mg/dL   Creatinine, Ser 1.80 (H) 0.44 - 1.00 mg/dL   Glucose, Bld 111 (H) 65 - 99 mg/dL   Calcium, Ion 1.36 1.15 - 1.40 mmol/Willis   TCO2 24 0 - 100 mmol/Willis   Hemoglobin 7.5 (Willis) 12.0 - 15.0 g/dL   HCT 22.0 (Willis) 36.0 - 46.0 %  Creatinine, serum     Status: Abnormal   Collection Time: 09/16/2016  9:19 PM  Result Value Ref Range   Creatinine, Ser 1.89 (H) 0.44 - 1.00 mg/dL   GFR calc non Af Amer 25 (Willis) >60 mL/min   GFR calc Af Amer 29 (Willis) >60 mL/min    Comment: (NOTE) The eGFR has been calculated using the CKD EPI equation. This calculation has not been validated in all clinical situations. eGFR's persistently <60  mL/min signify possible Chronic Kidney Disease.   CBC     Status: Abnormal   Collection Time: 09/04/2016  9:19 PM  Result Value Ref Range   WBC 10.8 (H) 4.0 - 10.5 K/uL   RBC 2.59 (Willis) 3.87 - 5.11 MIL/uL   Hemoglobin 7.7 (Willis) 12.0 - 15.0 g/dL   HCT 24.0 (Willis) 36.0 - 46.0 %   MCV 92.7 78.0 - 100.0 fL   MCH 29.7 26.0 - 34.0 pg   MCHC 32.1 30.0 - 36.0 g/dL   RDW 16.3 (H) 11.5 - 15.5 %   Platelets 117 (Willis) 150 - 400 K/uL    Comment: CONSISTENT WITH PREVIOUS RESULT  Magnesium     Status: Abnormal   Collection Time: 09/12/2016  9:19 PM  Result Value Ref Range   Magnesium 3.1 (H) 1.7 - 2.4 mg/dL  Glucose, capillary     Status: Abnormal   Collection Time: 09/01/2016 10:17 PM  Result Value Ref Range   Glucose-Capillary 116 (H) 65 - 99 mg/dL   Comment 1 Arterial Specimen   Glucose, capillary     Status: Abnormal   Collection Time: 09/20/2016 11:14 PM  Result Value Ref Range   Glucose-Capillary 124 (H) 65 - 99 mg/dL   Comment 1 Arterial Specimen   Glucose, capillary     Status: Abnormal   Collection Time: 09/04/16 12:09 AM  Result Value Ref Range   Glucose-Capillary 126 (H) 65 - 99 mg/dL   Comment 1 Arterial Specimen   Glucose, capillary     Status: Abnormal   Collection Time: 09/04/16  1:03 AM  Result Value Ref Range   Glucose-Capillary 132 (H) 65 - 99 mg/dL   Comment 1 Arterial Specimen   Glucose, capillary     Status: Abnormal   Collection Time: 09/04/16  2:14 AM  Result Value Ref Range   Glucose-Capillary 138 (H) 65 - 99 mg/dL   Comment 1 Arterial Specimen   CBC     Status: Abnormal   Collection Time: 09/04/16  3:10 AM  Result Value Ref Range   WBC 9.7 4.0 - 10.5 K/uL   RBC 2.47 (Willis) 3.87 - 5.11 MIL/uL   Hemoglobin 7.4 (Willis) 12.0 - 15.0 g/dL   HCT 22.5 (Willis) 36.0 - 46.0 %   MCV 91.1 78.0 - 100.0 fL   MCH  30.0 26.0 - 34.0 pg   MCHC 32.9 30.0 - 36.0 g/dL   RDW 16.2 (H) 11.5 - 15.5 %   Platelets 112 (Willis) 150 - 400 K/uL    Comment: CONSISTENT WITH PREVIOUS RESULT  Basic metabolic panel      Status: Abnormal   Collection Time: 09/04/16  3:10 AM  Result Value Ref Range   Sodium 138 135 - 145 mmol/Willis   Potassium 3.7 3.5 - 5.1 mmol/Willis   Chloride 107 101 - 111 mmol/Willis   CO2 21 (Willis) 22 - 32 mmol/Willis   Glucose, Bld 122 (H) 65 - 99 mg/dL   BUN 45 (H) 6 - 20 mg/dL   Creatinine, Ser 2.13 (H) 0.44 - 1.00 mg/dL   Calcium 9.0 8.9 - 10.3 mg/dL   GFR calc non Af Amer 22 (Willis) >60 mL/min   GFR calc Af Amer 25 (Willis) >60 mL/min    Comment: (NOTE) The eGFR has been calculated using the CKD EPI equation. This calculation has not been validated in all clinical situations. eGFR's persistently <60 mL/min signify possible Chronic Kidney Disease.    Anion gap 10 5 - 15  Magnesium     Status: Abnormal   Collection Time: 09/04/16  3:10 AM  Result Value Ref Range   Magnesium 2.8 (H) 1.7 - 2.4 mg/dL  Glucose, capillary     Status: Abnormal   Collection Time: 09/04/16  3:19 AM  Result Value Ref Range   Glucose-Capillary 140 (H) 65 - 99 mg/dL   Comment 1 Arterial Specimen   I-STAT 3, arterial blood gas (G3+)     Status: Abnormal   Collection Time: 09/04/16  3:26 AM  Result Value Ref Range   pH, Arterial 7.303 (Willis) 7.350 - 7.450   pCO2 arterial 45.7 32.0 - 48.0 mmHg   pO2, Arterial 146.0 (H) 83.0 - 108.0 mmHg   Bicarbonate 22.7 20.0 - 28.0 mmol/Willis   TCO2 24 0 - 100 mmol/Willis   O2 Saturation 99.0 %   Acid-base deficit 3.0 (H) 0.0 - 2.0 mmol/Willis   Patient temperature 36.9 C    Sample type ARTERIAL   Glucose, capillary     Status: Abnormal   Collection Time: 09/04/16  4:23 AM  Result Value Ref Range   Glucose-Capillary 104 (H) 65 - 99 mg/dL   Comment 1 Arterial Specimen   Glucose, capillary     Status: Abnormal   Collection Time: 09/04/16  5:02 AM  Result Value Ref Range   Glucose-Capillary 105 (H) 65 - 99 mg/dL   Comment 1 Arterial Specimen   Prepare RBC     Status: None   Collection Time: 09/04/16  7:13 AM  Result Value Ref Range   Order Confirmation      ORDER PROCESSED BY BLOOD BANK BB SAMPLE  OR UNITS ALREADY AVAILABLE  I-STAT 3, arterial blood gas (G3+)     Status: Abnormal   Collection Time: 09/04/16  7:37 AM  Result Value Ref Range   pH, Arterial 7.300 (Willis) 7.350 - 7.450   pCO2 arterial 45.4 32.0 - 48.0 mmHg   pO2, Arterial 108.0 83.0 - 108.0 mmHg   Bicarbonate 22.4 20.0 - 28.0 mmol/Willis   TCO2 24 0 - 100 mmol/Willis   O2 Saturation 98.0 %   Acid-base deficit 4.0 (H) 0.0 - 2.0 mmol/Willis   Patient temperature 36.7 C    Sample type ARTERIAL   I-STAT 3, arterial blood gas (G3+)     Status: Abnormal   Collection Time: 09/04/16  9:54 AM  Result Value Ref Range   pH, Arterial 7.332 (Willis) 7.350 - 7.450   pCO2 arterial 43.1 32.0 - 48.0 mmHg   pO2, Arterial 109.0 (H) 83.0 - 108.0 mmHg   Bicarbonate 23.0 20.0 - 28.0 mmol/Willis   TCO2 24 0 - 100 mmol/Willis   O2 Saturation 98.0 %   Acid-base deficit 3.0 (H) 0.0 - 2.0 mmol/Willis   Patient temperature 36.3 C    Collection site ARTERIAL LINE    Drawn by Nurse    Sample type ARTERIAL   Glucose, capillary     Status: None   Collection Time: 09/04/16 10:44 AM  Result Value Ref Range   Glucose-Capillary 81 65 - 99 mg/dL  Glucose, capillary     Status: Abnormal   Collection Time: 09/04/16 11:48 AM  Result Value Ref Range   Glucose-Capillary 110 (H) 65 - 99 mg/dL  I-STAT 3, arterial blood gas (G3+)     Status: Abnormal   Collection Time: 09/04/16 11:51 AM  Result Value Ref Range   pH, Arterial 7.322 (Willis) 7.350 - 7.450   pCO2 arterial 44.7 32.0 - 48.0 mmHg   pO2, Arterial 70.0 (Willis) 83.0 - 108.0 mmHg   Bicarbonate 23.4 20.0 - 28.0 mmol/Willis   TCO2 25 0 - 100 mmol/Willis   O2 Saturation 93.0 %   Acid-base deficit 3.0 (H) 0.0 - 2.0 mmol/Willis   Patient temperature 36.2 C    Collection site ARTERIAL LINE    Drawn by Nurse    Sample type ARTERIAL   Cooxemetry Panel (carboxy, met, total hgb, O2 sat)     Status: Abnormal   Collection Time: 09/04/16 12:20 PM  Result Value Ref Range   Total hemoglobin 9.1 (Willis) 12.0 - 16.0 g/dL   O2 Saturation 68.9 %   Carboxyhemoglobin  1.5 0.5 - 1.5 %   Methemoglobin 0.9 0.0 - 1.5 %  Glucose, capillary     Status: Abnormal   Collection Time: 09/04/16 12:56 PM  Result Value Ref Range   Glucose-Capillary 125 (H) 65 - 99 mg/dL   Comment 1 Arterial Specimen   Glucose, capillary     Status: Abnormal   Collection Time: 09/04/16  1:54 PM  Result Value Ref Range   Glucose-Capillary 121 (H) 65 - 99 mg/dL   Comment 1 Arterial Specimen   Glucose, capillary     Status: Abnormal   Collection Time: 09/04/16  3:31 PM  Result Value Ref Range   Glucose-Capillary 118 (H) 65 - 99 mg/dL   Comment 1 Capillary Specimen    Comment 2 Notify RN   Magnesium     Status: Abnormal   Collection Time: 09/04/16  4:18 PM  Result Value Ref Range   Magnesium 2.7 (H) 1.7 - 2.4 mg/dL  CBC     Status: Abnormal   Collection Time: 09/04/16  4:18 PM  Result Value Ref Range   WBC 13.9 (H) 4.0 - 10.5 K/uL   RBC 2.93 (Willis) 3.87 - 5.11 MIL/uL   Hemoglobin 9.0 (Willis) 12.0 - 15.0 g/dL   HCT 26.6 (Willis) 36.0 - 46.0 %   MCV 90.8 78.0 - 100.0 fL   MCH 30.7 26.0 - 34.0 pg   MCHC 33.8 30.0 - 36.0 g/dL   RDW 16.3 (H) 11.5 - 15.5 %   Platelets 102 (Willis) 150 - 400 K/uL    Comment: REPEATED TO VERIFY SPECIMEN CHECKED FOR CLOTS PLATELETS APPEAR DECREASED PLATELET COUNT CONFIRMED BY SMEAR   Creatinine, serum     Status:  Abnormal   Collection Time: 09/04/16  4:18 PM  Result Value Ref Range   Creatinine, Ser 2.43 (H) 0.44 - 1.00 mg/dL   GFR calc non Af Amer 18 (Willis) >60 mL/min   GFR calc Af Amer 21 (Willis) >60 mL/min    Comment: (NOTE) The eGFR has been calculated using the CKD EPI equation. This calculation has not been validated in all clinical situations. eGFR's persistently <60 mL/min signify possible Chronic Kidney Disease.   I-STAT, chem 8     Status: Abnormal   Collection Time: 09/04/16  4:30 PM  Result Value Ref Range   Sodium 137 135 - 145 mmol/Willis   Potassium 4.5 3.5 - 5.1 mmol/Willis   Chloride 104 101 - 111 mmol/Willis   BUN 41 (H) 6 - 20 mg/dL   Creatinine, Ser  2.30 (H) 0.44 - 1.00 mg/dL   Glucose, Bld 132 (H) 65 - 99 mg/dL   Calcium, Ion 1.23 1.15 - 1.40 mmol/Willis   TCO2 23 0 - 100 mmol/Willis   Hemoglobin 8.5 (Willis) 12.0 - 15.0 g/dL   HCT 25.0 (Willis) 36.0 - 46.0 %  Glucose, capillary     Status: Abnormal   Collection Time: 09/04/16  8:38 PM  Result Value Ref Range   Glucose-Capillary 187 (H) 65 - 99 mg/dL   Comment 1 Capillary Specimen   Glucose, capillary     Status: Abnormal   Collection Time: 09/04/16 10:29 PM  Result Value Ref Range   Glucose-Capillary 228 (H) 65 - 99 mg/dL   Comment 1 Arterial Specimen   Glucose, capillary     Status: Abnormal   Collection Time: 09/05/16 12:19 AM  Result Value Ref Range   Glucose-Capillary 213 (H) 65 - 99 mg/dL   Comment 1 Capillary Specimen   Blood gas, arterial     Status: Abnormal   Collection Time: 09/05/16  2:35 AM  Result Value Ref Range   O2 Content 2.0 Willis/min   Delivery systems NASAL CANNULA    pH, Arterial 7.235 (Willis) 7.350 - 7.450   pCO2 arterial 37.5 32.0 - 48.0 mmHg   pO2, Arterial 69.0 (Willis) 83.0 - 108.0 mmHg   Bicarbonate 15.4 (Willis) 20.0 - 28.0 mmol/Willis   Acid-base deficit 10.7 (H) 0.0 - 2.0 mmol/Willis   O2 Saturation 92.1 %   Patient temperature 97.7    Collection site A-LINE    Drawn by [id number]    Sample type ARTERIAL DRAW    Allens test (pass/fail) PASS PASS  Basic metabolic panel     Status: Abnormal   Collection Time: 09/05/16  3:28 AM  Result Value Ref Range   Sodium 133 (Willis) 135 - 145 mmol/Willis   Potassium 4.2 3.5 - 5.1 mmol/Willis   Chloride 101 101 - 111 mmol/Willis   CO2 17 (Willis) 22 - 32 mmol/Willis   Glucose, Bld 217 (H) 65 - 99 mg/dL   BUN 50 (H) 6 - 20 mg/dL   Creatinine, Ser 3.04 (H) 0.44 - 1.00 mg/dL   Calcium 8.4 (Willis) 8.9 - 10.3 mg/dL   GFR calc non Af Amer 14 (Willis) >60 mL/min   GFR calc Af Amer 16 (Willis) >60 mL/min    Comment: (NOTE) The eGFR has been calculated using the CKD EPI equation. This calculation has not been validated in all clinical situations. eGFR's persistently <60 mL/min signify possible  Chronic Kidney Disease.    Anion gap 15 5 - 15  CBC     Status: Abnormal   Collection Time: 09/05/16  3:28  AM  Result Value Ref Range   WBC 16.1 (H) 4.0 - 10.5 K/uL   RBC 2.89 (Willis) 3.87 - 5.11 MIL/uL   Hemoglobin 8.7 (Willis) 12.0 - 15.0 g/dL   HCT 26.2 (Willis) 36.0 - 46.0 %   MCV 90.7 78.0 - 100.0 fL   MCH 30.1 26.0 - 34.0 pg   MCHC 33.2 30.0 - 36.0 g/dL   RDW 16.4 (H) 11.5 - 15.5 %   Platelets 101 (Willis) 150 - 400 K/uL    Comment: CONSISTENT WITH PREVIOUS RESULT  T4, free     Status: None   Collection Time: 09/05/16  3:28 AM  Result Value Ref Range   Free T4 0.79 0.61 - 1.12 ng/dL    Comment: (NOTE) Biotin ingestion may interfere with free T4 tests. If the results are inconsistent with the TSH level, previous test results, or the clinical presentation, then consider biotin interference. If needed, order repeat testing after stopping biotin.   Glucose, capillary     Status: Abnormal   Collection Time: 09/05/16  3:48 AM  Result Value Ref Range   Glucose-Capillary 204 (H) 65 - 99 mg/dL   Comment 1 Capillary Specimen   Glucose, capillary     Status: Abnormal   Collection Time: 09/05/16  7:53 AM  Result Value Ref Range   Glucose-Capillary 202 (H) 65 - 99 mg/dL   Comment 1 Notify RN   Cooxemetry Panel (carboxy, met, total hgb, O2 sat)     Status: Abnormal   Collection Time: 09/05/16  8:30 AM  Result Value Ref Range   Total hemoglobin 8.0 (Willis) 12.0 - 16.0 g/dL   O2 Saturation 73.8 %   Carboxyhemoglobin 0.9 0.5 - 1.5 %   Methemoglobin 1.1 0.0 - 1.5 %  Glucose, capillary     Status: Abnormal   Collection Time: 09/05/16 11:22 AM  Result Value Ref Range   Glucose-Capillary 185 (H) 65 - 99 mg/dL  I-STAT 3, arterial blood gas (G3+)     Status: Abnormal   Collection Time: 09/05/16 12:13 PM  Result Value Ref Range   pH, Arterial 7.310 (Willis) 7.350 - 7.450   pCO2 arterial 36.5 32.0 - 48.0 mmHg   pO2, Arterial 76.0 (Willis) 83.0 - 108.0 mmHg   Bicarbonate 18.5 (Willis) 20.0 - 28.0 mmol/Willis   TCO2 20 0 -  100 mmol/Willis   O2 Saturation 94.0 %   Acid-base deficit 7.0 (H) 0.0 - 2.0 mmol/Willis   Patient temperature 97.3 F    Collection site ARTERIAL LINE    Drawn by Nurse    Sample type ARTERIAL     Dg Chest Port 1 View  Result Date: 09/05/2016 CLINICAL DATA:  Chest tube, CABG. EXAM: PORTABLE CHEST 1 VIEW COMPARISON:  09/04/2016. FINDINGS: Interval extubation. Nasogastric tube has been removed as well. Right IJ Swan-Ganz catheter tip projects over the proximal right pulmonary artery. Right IJ central line is in the SVC. Mediastinal drain and left chest tube remain in place as do epicardial pacer wires. Seven intact sternotomy wires. Cardiomediastinal silhouette is enlarged, stable. Lungs are low in volume with interstitial prominence and indistinctness. Small right pleural effusion. Improved aeration in the left lower lobe. Surgical clips in left axilla. IMPRESSION: 1. Congestive heart failure. 2. Improved aeration in the left lower lobe. Electronically Signed   By: Lorin Picket M.D.   On: 09/05/2016 07:57   Dg Chest Port 1 View  Result Date: 09/04/2016 CLINICAL DATA:  Intubated patient status post CABG yesterday EXAM: PORTABLE CHEST 1  VIEW COMPARISON:  Portable chest x-ray of September 03, 2016. FINDINGS: The lungs are adequately inflated. There are bilateral pleural effusions greatest on the right. The cardiac silhouette remains enlarged. The pulmonary vascularity remains engorged. There is no pneumothorax. The mediastinal drain and left chest tube are in stable position. The Swan-Ganz catheter tip projects in the proximal right main pulmonary artery. The right internal jugular venous catheter tip projects over the midportion of the SVC. The endotracheal tube tip lies 4.5 cm above the carina. The esophagogastric tubes proximal port lies at or just below the GE junction. IMPRESSION: CHF with mild interstitial edema no pneumothorax. Small bilateral pleural effusions greatest on the right. Left lower lobe  atelectasis. Advancement of the esophagogastric tube by 510 cm is recommended. The other support tubes are in reasonable position. Electronically Signed   By: David  Martinique M.D.   On: 09/04/2016 07:55   Dg Chest Port 1 View  Result Date: 09/01/2016 CLINICAL DATA:  Status post CABG. EXAM: PORTABLE CHEST 1 VIEW COMPARISON:  09/16/2016 FINDINGS: 1549 hours. Endotracheal tube tip is about 2.4 cm above the base of the carina. Right IJ pulmonary artery catheter tip is in the right main pulmonary artery. A second right IJ central line tip overlies the mid SVC level. NG tube tip is in the mid stomach. Left chest tube noted without evidence for left-sided pneumothorax. Midline mediastinal/ pericardial drain evident. The cardio pericardial silhouette is enlarged. Bibasilar atelectasis noted with small right pleural effusion, similar to prior. The visualized bony structures of the thorax are intact. Telemetry leads overlie the chest. IMPRESSION: Status post CABG with positioning of support apparatus as described. Cardiomegaly with vascular congestion, basilar atelectasis and small right pleural effusion. Electronically Signed   By: Misty Stanley M.D.   On: 08/24/2016 15:54    ROS Blood pressure (!) 101/45, pulse 70, temperature 97.2 F (36.2 C), temperature source Oral, resp. rate 18, weight 98.2 kg (216 lb 7.9 oz), SpO2 100 %. Physical Exam Physical Examination: General appearance - obese, pale, , moaning Mental status - OX1 Eyes - DM retinal dz Mouth - mucous membranes moist, pharynx normal without lesions Neck - adenopathy noted PCL Lymphatics - posterior cervical nodes Chest - prom crackles throughout Willis lung, R base Heart - S1 and S2 normal, systolic murmur LM7/8 at apex Abdomen - obese, MT tubes, bs present but decreased Extremities - Willis leg more swollen, purplish, vein harvest sites, feet very cool and pale,  Skin - pale, many bruises,esp Willis leg thigh and calf  Assessment/Plan: 1 AKI most likely  hemodynamic. Will eval sediment and consider intestitial injury, presentation on 6/12 suspicious for embolic injury but doubt..  Vol xs, acidemic. >20% liklihood will need dialysis 2 CKD 4 DM and HTN 3 Hypotension on pressors, inotropes 4. Anemia eval and tx 5. 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

## 2016-09-05 NOTE — Progress Notes (Signed)
      [address]       [place] [address]             [phone]      POD # 2  BP (!) 77/60   Pulse 70   Temp 97.7 F (36.5 C) (Oral)   Resp 17   Wt 216 lb 7.9 oz (98.2 kg)   SpO2 95%   BMI 39.60 kg/m    Intake/Output Summary (Last 24 hours) at 09/05/16 1756 Last data filed at 09/05/16 1600  Gross per 24 hour  Intake          1815.11 ml  Output              741 ml  Net          1074.11 ml   On dopamine, milrinone and Levophed  Creatinine 2.8 down from 3.0  Continue current care  Eleftherios Dudenhoeffer C. Roxan Hockey, MD [facility] [phone]

## 2016-09-05 NOTE — Progress Notes (Signed)
Spoke with MD lactic 5.8, order to give 2 amps of bicarb and page nephrology, second MD paged and lab acknowledged, a/w bmp result for further orders, will continue to monitor.   Sherrie Mustache 5:40 PM

## 2016-09-05 NOTE — Progress Notes (Signed)
Patient placed on CPAP for the evening. Patient is tolerating well at this time. RT will continue to monitor as needed.

## 2016-09-05 NOTE — Progress Notes (Signed)
Paged Dr. Cyndia Bent again after no response to first page.

## 2016-09-05 NOTE — Evaluation (Signed)
Clinical/Bedside Swallow Evaluation Patient Details  Name: Jillian Willis MRN: [id number] Date of Birth: 03-12-1941  Today's Date: 09/05/2016 Time: SLP Start Time (ACUTE ONLY): 41 SLP Stop Time (ACUTE ONLY): 1027 SLP Time Calculation (min) (ACUTE ONLY): 22 min  Past Medical History:  Past Medical History:  Diagnosis Date  . Arthritis    "right hip; left nee" (09/01/2016)  . Breast cancer in female [facility]) 2000   "left"  . CHF (congestive heart failure) ([place])   . CKD (chronic kidney disease), stage II   . Depression   . GERD (gastroesophageal reflux disease)   . History of hiatal hernia   . Hyperlipidemia   . Hypertension   . Hypothyroidism   . IHD (ischemic heart disease)    s/p cath in 2009 showing heavily calcified left main with mild to moderate diffuse CAD. Remote angioplasty of the mid LAD in 2004.  Last myoview in 2012 felt to be low risk.   . Migraine    "none since breast reduction in ~ 2005" (09/01/2016)  . Myocardial infarction [facility])    "was told I've had one; don't know when" (09/16/2016)  . Obesity   . OSA on CPAP   . Pneumonia 1990s X 1  . Seasonal allergies   . Type II diabetes mellitus ([place])    Past Surgical History:  Past Surgical History:  Procedure Laterality Date  . BREAST BIOPSY Left 2000  . BREAST LUMPECTOMY Left 2000  . CARDIAC CATHETERIZATION  06/29/2007   EF 50-55%  . CARDIAC CATHETERIZATION  11/14/2002   EF 55-60%  . CARDIAC CATHETERIZATION  05/09/2002   EF 45%  . CARDIAC CATHETERIZATION  07/2016  . CARDIOVASCULAR STRESS TEST  05/23/10   EF 68%, NO ISCHEMIA  . CATARACT EXTRACTION W/ INTRAOCULAR LENS  IMPLANT, BILATERAL Bilateral   . CHOLECYSTECTOMY OPEN  ~ 1980  . CORONARY ARTERY BYPASS GRAFT N/A 09/02/2016   Procedure: CORONARY ARTERY BYPASS GRAFTING (CABG), ON PUMP, TIMES THREE, USING LEFT INTERNAL MAMMARY ARTERY AND ENDOSCOPICALLY HARVESTED LEFT GREATER SAPHENOUS VEIN;  Surgeon: Ivin Poot, MD;  Location: [place];  Service: Open Heart Surgery;   Laterality: N/A;  LIMA to LAD, SVG to RAMUS INTERMEDIATE, SVG to RCA  . DILATION AND CURETTAGE OF UTERUS  1970s X 1  . KNEE ARTHROSCOPY Left 1980s X 2  . REDUCTION MAMMAPLASTY Right 2005  . RIGHT/LEFT HEART CATH AND CORONARY ANGIOGRAPHY N/A 08/20/2016   Procedure: Right/Left Heart Cath and Coronary Angiography;  Surgeon: Sherren Mocha, MD;  Location: [place] [facility];  Service: Cardiovascular;  Laterality: N/A;  . SHOULDER ARTHROSCOPY W/ ROTATOR CUFF REPAIR Right   . TEE WITHOUT CARDIOVERSION N/A 08/29/2016   Procedure: TRANSESOPHAGEAL ECHOCARDIOGRAM (TEE);  Surgeon: Prescott Gum, Collier Salina, MD;  Location: [place];  Service: Open Heart Surgery;  Laterality: N/A;   HPI:  Pt is a 76 year old female with obesity, diabetes, known CAD, underwent urgent CABG x3. Intubated for procedure 6/13, extbated next day. Pt has a history of radioation for breast CA and probable esopahgeal dysphagia. MBS completed in 2013 showed normal oropharyngeal swallow. AFter surgery RN noticed consistent coughing with clear liquid diet.    Assessment / Plan / Recommendation Clinical Impression  Pt demosntrates signs of a mild, acute reversible dysphagia following brief intubation, significant surgery and deconditioning. Pt coughs immediately following sips of thin liquids, but tolerates ice and puree well. Would not proceed with objective testing yet as pt is very weak and fragile and expect dysphagia to improve rapidly  as she did tolerate sips of water at the end of session after eating puree. For now, pt to consume ice chips as desired, and puree and pudding. Meds can be given whole in puree. Will f/u for further diagnostic treatment tomorrow for potential diet upgrade vs objective testing if impairment persists.  SLP Visit Diagnosis: Dysphagia, oropharyngeal phase (R13.12)    Aspiration Risk  Moderate aspiration risk    Diet Recommendation NPO except meds;Dysphagia 1 (Puree)   Medication Administration: Whole meds with  puree Supervision: Staff to assist with self feeding;Full supervision/cueing for compensatory strategies Compensations: Slow rate;Small sips/bites Postural Changes: Seated upright at 90 degrees    Other  Recommendations Oral Care Recommendations: Oral care QID   Follow up Recommendations Skilled Nursing facility      Frequency and Duration min 2x/week  2 weeks       Prognosis Prognosis for Safe Diet Advancement: Good      Swallow Study   General HPI: Pt is a 76 year old female with obesity, diabetes, known CAD, underwent urgent CABG x3. Intubated for procedure 6/13, extbated next day. Pt has a history of radioation for breast CA and probable esopahgeal dysphagia. MBS completed in 2013 showed normal oropharyngeal swallow. AFter surgery RN noticed consistent coughing with clear liquid diet.  Type of Study: Bedside Swallow Evaluation Previous Swallow Assessment: see impression Diet Prior to this Study: Thin liquids Temperature Spikes Noted: No Respiratory Status: Nasal cannula History of Recent Intubation: Yes Length of Intubations (days): 1 days Date extubated: 09/04/16 Behavior/Cognition: Lethargic/Drowsy Oral Cavity Assessment: Within Functional Limits Oral Care Completed by SLP: Recent completion by staff Oral Cavity - Dentition: Edentulous;Dentures, not available Self-Feeding Abilities: Total assist Patient Positioning: Upright in bed Baseline Vocal Quality: Low vocal intensity Volitional Cough: Weak Volitional Swallow: Able to elicit    Oral/Motor/Sensory Function Overall Oral Motor/Sensory Function: Within functional limits   Ice Chips Ice chips: Within functional limits   Thin Liquid Thin Liquid: Impaired Presentation: Cup;Straw Pharyngeal  Phase Impairments: Cough - Immediate    Nectar Thick Nectar Thick Liquid: Not tested   Honey Thick Honey Thick Liquid: Not tested   Puree Puree: Within functional limits Presentation: Spoon   Solid   GO   Solid: Not tested        Herbie Baltimore, MA CCC-SLP [phone]  Jashawn Floyd, Katherene Ponto 09/05/2016,10:32 AM

## 2016-09-06 ENCOUNTER — Inpatient Hospital Stay (HOSPITAL_COMMUNITY): Payer: Medicare Other

## 2016-09-06 DIAGNOSIS — J9600 Acute respiratory failure, unspecified whether with hypoxia or hypercapnia: Secondary | ICD-10-CM

## 2016-09-06 LAB — POCT I-STAT, CHEM 8
BUN: 26 mg/dL — ABNORMAL HIGH (ref 6–20)
BUN: 30 mg/dL — ABNORMAL HIGH (ref 6–20)
Calcium, Ion: 0.35 mmol/L — CL (ref 1.15–1.40)
Calcium, Ion: 0.74 mmol/L — CL (ref 1.15–1.40)
Chloride: 94 mmol/L — ABNORMAL LOW (ref 101–111)
Chloride: 94 mmol/L — ABNORMAL LOW (ref 101–111)
Creatinine, Ser: 1.4 mg/dL — ABNORMAL HIGH (ref 0.44–1.00)
Creatinine, Ser: 1.9 mg/dL — ABNORMAL HIGH (ref 0.44–1.00)
Glucose, Bld: 287 mg/dL — ABNORMAL HIGH (ref 65–99)
Glucose, Bld: 320 mg/dL — ABNORMAL HIGH (ref 65–99)
HCT: 21 % — ABNORMAL LOW (ref 36.0–46.0)
HCT: 25 % — ABNORMAL LOW (ref 36.0–46.0)
Hemoglobin: 7.1 g/dL — ABNORMAL LOW (ref 12.0–15.0)
Hemoglobin: 8.5 g/dL — ABNORMAL LOW (ref 12.0–15.0)
Potassium: 3.7 mmol/L (ref 3.5–5.1)
Potassium: 3.9 mmol/L (ref 3.5–5.1)
Sodium: 136 mmol/L (ref 135–145)
Sodium: 139 mmol/L (ref 135–145)
TCO2: 19 mmol/L (ref 0–100)
TCO2: 20 mmol/L (ref 0–100)

## 2016-09-06 LAB — CBC
HCT: 21 % — ABNORMAL LOW (ref 36.0–46.0)
HCT: 24.5 % — ABNORMAL LOW (ref 36.0–46.0)
Hemoglobin: 7 g/dL — ABNORMAL LOW (ref 12.0–15.0)
Hemoglobin: 8.1 g/dL — ABNORMAL LOW (ref 12.0–15.0)
MCH: 30 pg (ref 26.0–34.0)
MCH: 30.3 pg (ref 26.0–34.0)
MCHC: 33.1 g/dL (ref 30.0–36.0)
MCHC: 33.3 g/dL (ref 30.0–36.0)
MCV: 90.7 fL (ref 78.0–100.0)
MCV: 90.9 fL (ref 78.0–100.0)
Platelets: 68 10*3/uL — ABNORMAL LOW (ref 150–400)
Platelets: 72 10*3/uL — ABNORMAL LOW (ref 150–400)
RBC: 2.31 MIL/uL — ABNORMAL LOW (ref 3.87–5.11)
RBC: 2.7 MIL/uL — ABNORMAL LOW (ref 3.87–5.11)
RDW: 16.1 % — ABNORMAL HIGH (ref 11.5–15.5)
RDW: 16.2 % — AB (ref 11.5–15.5)
WBC: 8.2 10*3/uL (ref 4.0–10.5)
WBC: 9.4 10*3/uL (ref 4.0–10.5)

## 2016-09-06 LAB — TYPE AND SCREEN
ABO/RH(D): AB POS
Antibody Screen: NEGATIVE
Unit division: 0
Unit division: 0
Unit division: 0
Unit division: 0
Unit division: 0
Unit division: 0
Unit division: 0
Unit division: 0

## 2016-09-06 LAB — GLUCOSE, CAPILLARY
Glucose-Capillary: 96 mg/dL (ref 65–99)
Glucose-Capillary: 86 mg/dL (ref 65–99)
Glucose-Capillary: 80 mg/dL (ref 65–99)
Glucose-Capillary: 110 mg/dL — ABNORMAL HIGH (ref 65–99)
Glucose-Capillary: 179 mg/dL — ABNORMAL HIGH (ref 65–99)
Glucose-Capillary: 142 mg/dL — ABNORMAL HIGH (ref 65–99)
Glucose-Capillary: 121 mg/dL — ABNORMAL HIGH (ref 65–99)

## 2016-09-06 LAB — BPAM RBC
Blood Product Expiration Date: 201806262359
Blood Product Expiration Date: 201806262359
Blood Product Expiration Date: 201806262359
Blood Product Expiration Date: 201806272359
Blood Product Expiration Date: 201806272359
Blood Product Expiration Date: 201806272359
Blood Product Expiration Date: 201806292359
Blood Product Expiration Date: 201807032359
ISSUE DATE / TIME: 201806130437
ISSUE DATE / TIME: 201806131148
ISSUE DATE / TIME: 201806131148
ISSUE DATE / TIME: 201806131255
ISSUE DATE / TIME: 201806131452
ISSUE DATE / TIME: 201806140822
Unit Type and Rh: 6200
Unit Type and Rh: 6200
Unit Type and Rh: 6200
Unit Type and Rh: 6200
Unit Type and Rh: 6200
Unit Type and Rh: 6200
Unit Type and Rh: 8400
Unit Type and Rh: 8400

## 2016-09-06 LAB — TROPONIN I: Troponin I: 4.83 ng/mL (ref <0.03)

## 2016-09-06 LAB — POCT I-STAT 3, ART BLOOD GAS (G3+)
Acid-base deficit: 10 mmol/L — ABNORMAL HIGH (ref 0.0–2.0)
Bicarbonate: 17.4 mmol/L — ABNORMAL LOW (ref 20.0–28.0)
O2 Saturation: 100 %
Patient temperature: 92.3
TCO2: 19 mmol/L (ref 0–100)
pCO2 arterial: 38.3 mmHg (ref 32.0–48.0)
pH, Arterial: 7.246 — ABNORMAL LOW (ref 7.350–7.450)
pO2, Arterial: 213 mmHg — ABNORMAL HIGH (ref 83.0–108.0)

## 2016-09-06 LAB — PROCALCITONIN: Procalcitonin: 1.05 ng/mL

## 2016-09-06 MED ORDER — MIDAZOLAM HCL 2 MG/2ML IJ SOLN
1.0000 mg | INTRAMUSCULAR | Status: DC | PRN
Start: 2016-09-06 — End: 2016-09-06

## 2016-09-06 MED ORDER — CHLORHEXIDINE GLUCONATE 0.12% ORAL RINSE (MEDLINE KIT): 15.0000 mL | Freq: Two times a day (BID) | OROMUCOSAL | Status: DC

## 2016-09-06 MED ORDER — FENTANYL 2500MCG IN NS 250ML (10MCG/ML) PREMIX INFUSION
25.0000 ug/h | INTRAVENOUS | Status: DC
Start: 2016-09-06 — End: 2016-09-06

## 2016-09-06 MED ORDER — PANTOPRAZOLE SODIUM 40 MG IV SOLR: 40.0000 mg | Freq: Every day | INTRAVENOUS | Status: DC

## 2016-09-06 MED ORDER — ORAL CARE MOUTH RINSE: 15.0000 mL | Freq: Four times a day (QID) | OROMUCOSAL | Status: DC

## 2016-09-06 MED ORDER — MIDAZOLAM HCL 2 MG/2ML IJ SOLN: 1.0000 mg | INTRAMUSCULAR | Status: DC | PRN

## 2016-09-06 MED ORDER — FENTANYL CITRATE (PF) 100 MCG/2ML IJ SOLN: 50.0000 ug | INTRAMUSCULAR | Status: DC | PRN

## 2016-09-06 MED ORDER — ALBUTEROL SULFATE (2.5 MG/3ML) 0.083% IN NEBU: 2.5000 mg | INHALATION_SOLUTION | RESPIRATORY_TRACT | Status: DC | PRN

## 2016-09-06 MED ORDER — SODIUM BICARBONATE 8.4 % IV SOLN
INTRAVENOUS | Status: DC
Start: 2016-09-06 — End: 2016-09-06
  Administered 2016-09-06: 01:00:00 via INTRAVENOUS
  Filled 2016-09-06 (×2): qty 850

## 2016-09-06 MED ORDER — DOCUSATE SODIUM 50 MG/5ML PO LIQD: 100.0000 mg | Freq: Two times a day (BID) | ORAL | Status: DC | PRN

## 2016-09-08 LAB — GLUCOSE, CAPILLARY: Glucose-Capillary: 182 mg/dL — ABNORMAL HIGH (ref 65–99)

## 2016-09-09 LAB — C3 COMPLEMENT: C3 COMPLEMENT: 62 mg/dL — AB (ref 82–167)

## 2016-09-09 LAB — C4 COMPLEMENT: Complement C4, Body Fluid: 16 mg/dL (ref 14–44)

## 2016-09-21 NOTE — Progress Notes (Signed)
Dr. Roxan Hockey notified of patients change in status post intubation. No new orders given. Will continue to monitor.

## 2016-09-21 NOTE — Progress Notes (Signed)
Critical ABG results given to Vernie Murders, MD.  No new vent orders received at this time.

## 2016-09-21 NOTE — Procedures (Signed)
Bedside transthoracic echocardiogram  LV: dilated with global dyskinesis (septum more severe than posterior wall) RV: dilated with global dyskinesis Mitral valve and aortic valve appear structurally normal. Mild to mod MR with trace AI. No significant pericardial effusion  Soyla Murphy, MD

## 2016-09-21 NOTE — Progress Notes (Signed)
2mg  of versed, 30mg  of Etomidate, and 100mg  of Succinylcholine per Dr. Phylliss Bob used for intubation. MD also ordered 56mg  IV push of levophed during intubation to support low blood pressure.

## 2016-09-21 NOTE — Progress Notes (Signed)
Dr. Haroldine Laws updated on patients change in status. Pts systolic bp 97-18E with MAPs in the 50s. Goal MAP 65. Pt maxed out on levophed and epi. Pts lactic acid resulted at 8.8 which was increased from 5.8 three hours prior. Pts ABG also was lower than previously resulted. Order given for vasopressin and a sodium bicarb drip to be started and a consult for CCM to be ordered. Orders carried out. RN will continue to monitor.

## 2016-09-21 NOTE — Discharge Summary (Signed)
NAMEAVIELA, BLUNDELL NO.:  [id number]  MEDICAL RECORD NO.:  [id number]  LOCATION:                                 FACILITY:  PHYSICIAN:  Ivin Poot, M.D.       DATE OF BIRTH:  DATE OF ADMISSION:  09/20/2016 DATE OF DISCHARGE:  Sep 22, 2016                              DISCHARGE SUMMARY   Date of discharge-death:  2016/09/22.  ADMISSION DIAGNOSES: 1. Ischemic cardiomyopathy, ejection fraction of 20% with class 4     congestive heart failure. 2. Diabetes mellitus. 3. Obesity. 4. Obstructive sleep apnea. 5. Hypertension. 6. Acute-on-chronic renal failure with rising creatinine, history of     nephrosclerosis. 7. Status post left lumpectomy with radiation and chemotherapy for     history of breast cancer.  OPERATIONS AND PROCEDURES: 1. Urgent coronary artery bypass grafting x3 by Dr. Ivin Poot on     September 03, 2016. 2. Placement of hemodialysis (Trialysis catheter) by Dr. Haroldine Laws,     September 04, 2016.  HISTORY OF PRESENT ILLNESS:  The patient is a morbidly obese 76 year old diabetic female, with history of known coronary artery disease and ischemic cardiomyopathy.  I had evaluated the patient in the office for potential surgical coronary revascularization with CABG.  Because of her severe coronary disease and poor target vessels, severe LV dysfunction, obesity, and presence of varicose veins, I thought that she was at high risk for surgical coronary revascularization.  I sent the patient for vein mapping and a viability study to assess for reversibility of LV dysfunction with revascularization.  The patient reported to her Cardiology office after vein mapping with symptoms of heart failure including a weight gain of 9 pounds in 2 days and decreasing urine output.  The patient was found to be mildly hypotensive with evidence of congestive heart failure and an increase in creatinine from 1.6-2.2.  The patient was admitted to the  Cardiology Service and CT surgical evaluation was requested on an urgent basis.  I examined the patient in her hospital room after reviewing her coronary arteriograms again.  Her vein mapping study showed the presence of adequate, but somewhat dilated greater saphenous vein to use as conduit. Unfortunately, she had not received a viability study.  The patient was in a sinus rhythm.  She had evidence of pleural effusion on her chest x- ray.  She was not acidotic and had normal mentation.  Cardiology recommended surgical revascularization sooner than later.  I discussed the procedure of high risk CABG with the patient and her family including the risks of MI, stroke, renal failure requiring dialysis, and death.  Informed consent was obtained.  The patient was brought to the operating room the next day.  After she was due for general anesthesia, her cardiac output was 1.7 L/minute. Echocardiogram showed EF of 15% with mild mitral regurgitation.  The patient was started on Milrinone and low-dose dopamine to improve her low urine output.  The patient subsequently proceeded with surgical coronary revascularization of her LAD, circumflex, and right coronary vessels. She separated from cardiopulmonary bypass on inotropic support with cardiac output now increased to 3.84 L/minute.  She was in a  paced rhythm and her pleural effusion has been drained.  She was not acidotic on her blood gas and oxygenation was adequate.  She returned to the ICU and remained hemodynamically stable through the night.  However, her blood gas checked late that night showed a metabolic acidosis with a base deficit of minus 10.  There was no notification to the physician on- call until the next day.  Her cardiac index remained 1.8.  The next day, she was treated with bicarb and blood gas followup monitoring.  She was extubated the morning after surgery and neurologically intact.  Over the course of the 1st  postoperative day, she developed atrial fibrillation and became anuric.  Subsequent metabolic acidosis progressed despite therapy and her lactic acid level was 11.2.  She had evidence of abdominal distention and tenderness.  Cardiology Critical Care was consulted and her inotropic support was increased.  A dialysis catheter was placed because of anuria.  She continued to show refractory acidosis; however, with gradual dropping in the blood pressures and the family was notified by the on-call physician and decision was made to transition to comfort care.  The patient quietly expired at 2 A.M. on the 2nd postoperative day.  FINAL DIAGNOSES: 1. Severe three-vessel coronary artery disease with ischemic     cardiomyopathy. 2. Class 4 congestive heart failure, low output cardiac failure on     admission. 3. Acute-on-chronic renal failure from nephrosclerosis. 4. Postoperative metabolic acidosis from probable mesenteric ischemia. 5. Diabetes.     Ivin Poot, M.D.     PV/MEDQ  D:  09/14/2016  T:  09/14/2016  Job:  [id number]

## 2016-09-21 NOTE — Progress Notes (Signed)
Pts blood pressure steadily was decreasing. Pts family brought to bedside due to change in status. RN discussed with patients husband if he would still like to keep a full code in place. Pt started to code as soon as husband arrived at bedside. Husband decided NOT to initiate a code blue on patient. Patients passed at [place] on Sep 13, 2016. Gladys Damme, RN and Rosalio Macadamia, RN listened to heart and lung sounds for one full minute with none auscultated. Patients husband and friends were at bedside with patient at passing.  [facility] [facility] Referral Number: [id number] CDS Representative: [facility] [place] Pt suitable for tissue donation only.  No patient belongings at bedside.

## 2016-09-21 DEATH — deceased

## 2016-10-09 ENCOUNTER — Ambulatory Visit: Payer: Medicare Other | Admitting: Cardiology

## 2018-01-31 IMAGING — DX DG CHEST 1V PORT
1 series · 1 of 1 positions shown · non-contrast
Comparison: 08/25/2016

CLINICAL DATA: Heart failure.  Weight gain.

EXAM:
PORTABLE CHEST 1 VIEW

[chest ap]
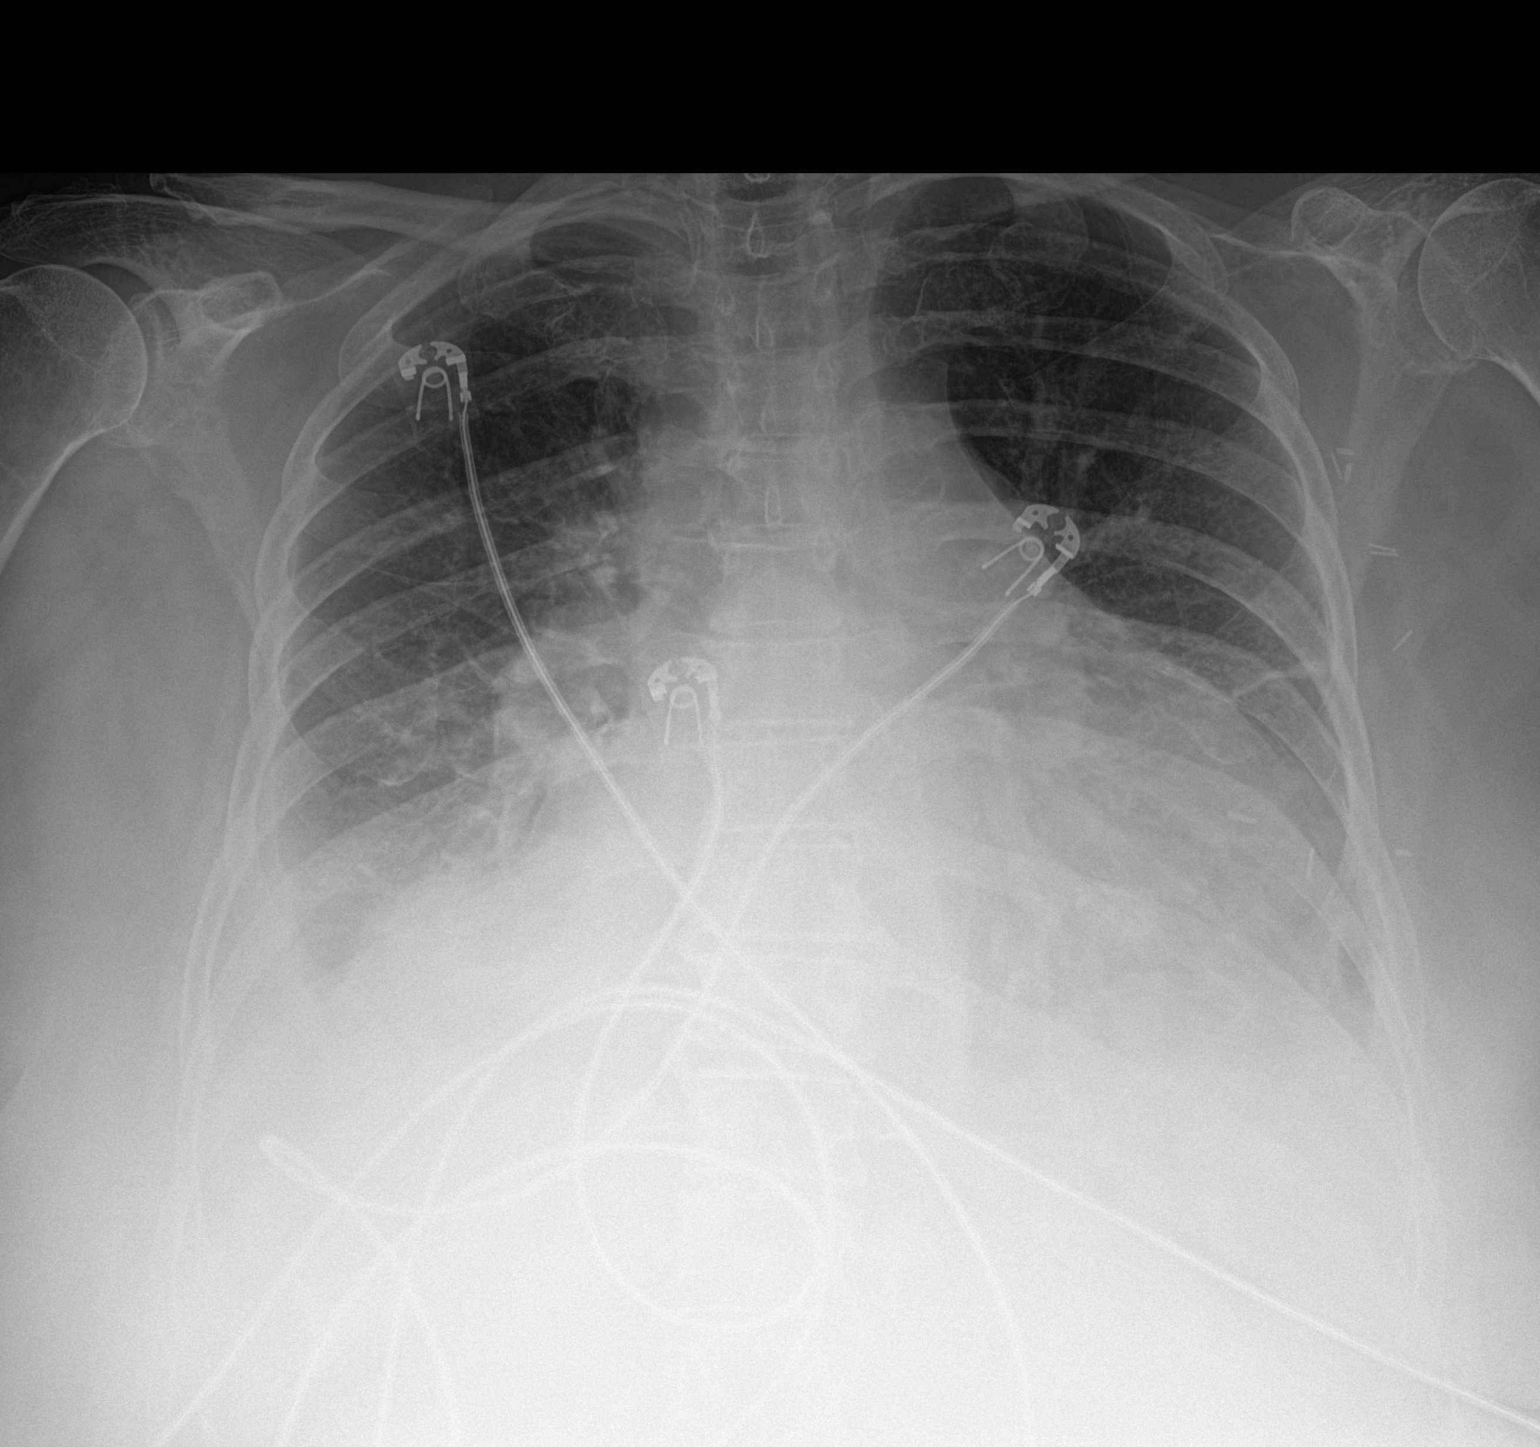

[1 of 1 positions shown; findings below may reference images not displayed]

FINDINGS: Chronic cardiomegaly. Pulmonary venous hypertension with
interstitial edema. Accumulating effusions, right more than left.
Associated dependent pulmonary volume loss.
IMPRESSION: Worsened edema pattern compared to the previous study. Effusions
with lower lobe volume loss right more than left. Cardiomegaly.

## 2018-02-01 IMAGING — CR DG CHEST 1V PORT
1 series · 1 of 1 positions shown · non-contrast
Comparison: 09/02/2016

CLINICAL DATA: Status post CABG.

EXAM:
PORTABLE CHEST 1 VIEW

[AP]
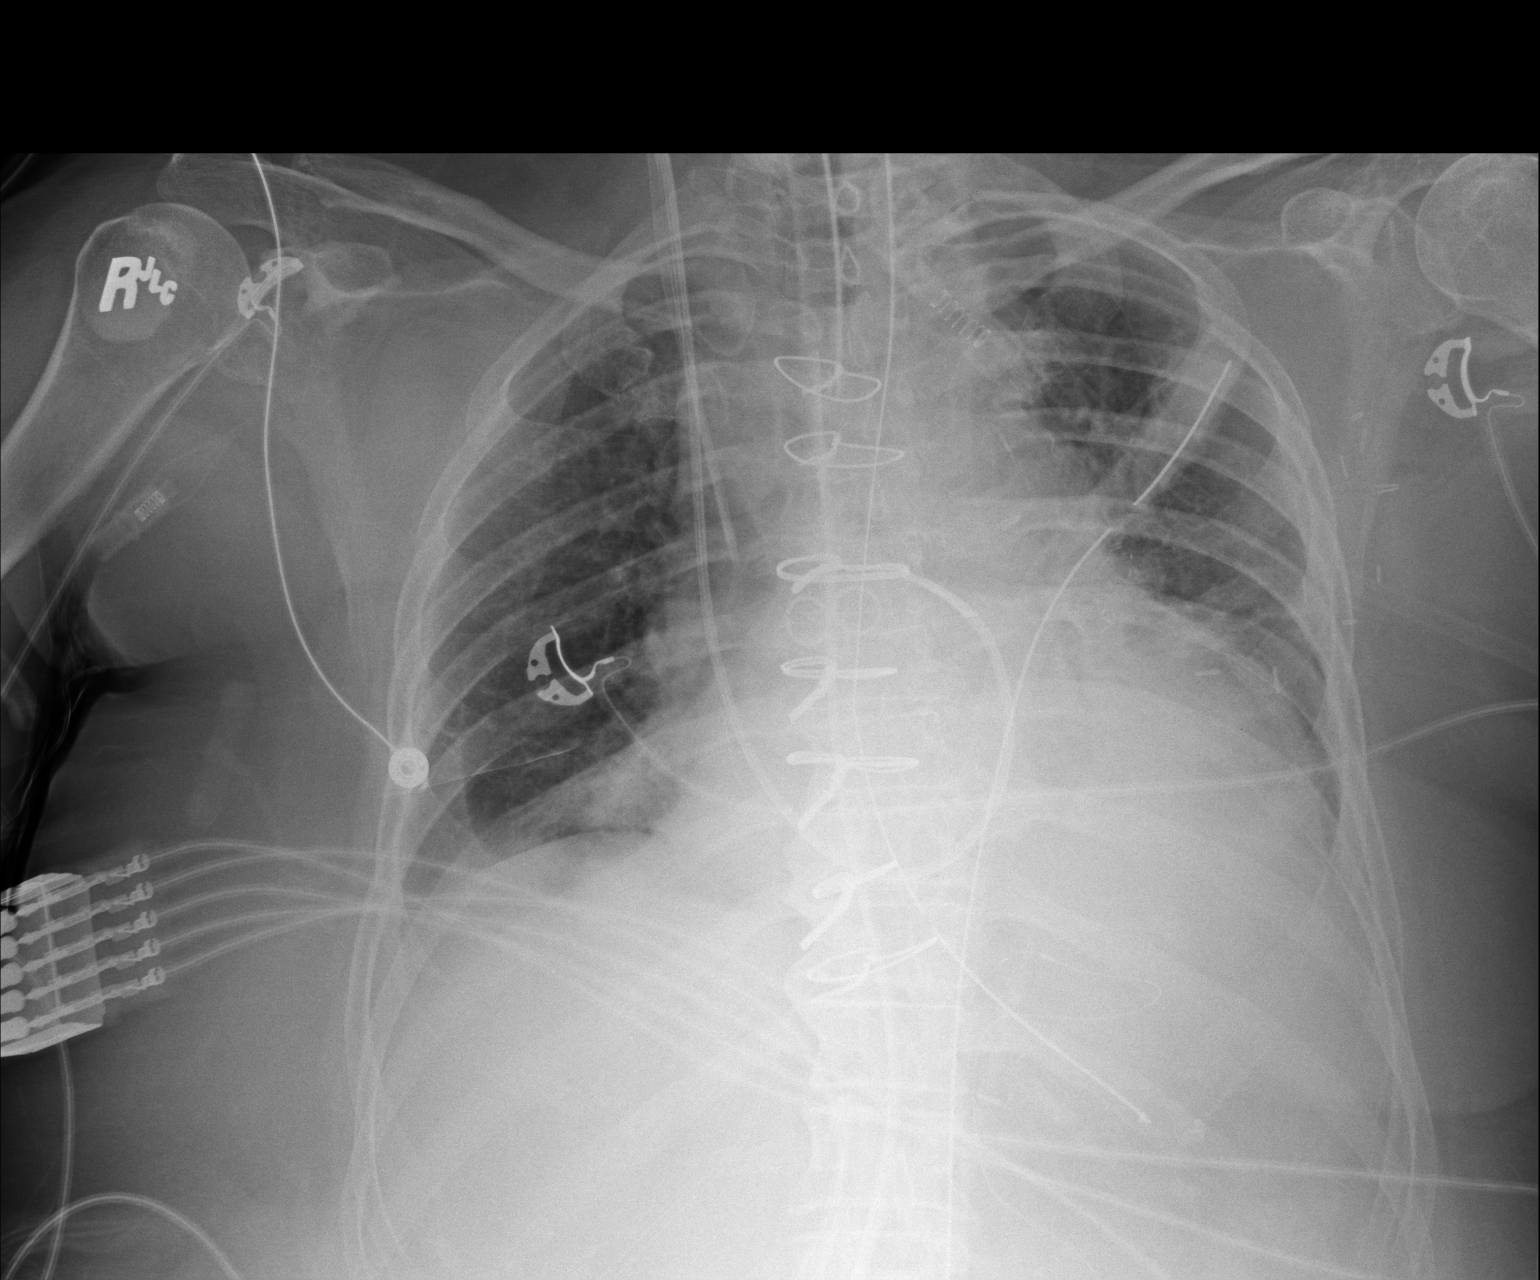

[1 of 1 positions shown; findings below may reference images not displayed]

FINDINGS: 8699 hours. Endotracheal tube tip is about 2.4 cm above the base of
the carina. Right IJ pulmonary artery catheter tip is in the right
main pulmonary artery. A second right IJ central line tip overlies
the mid SVC level. NG tube tip is in the mid stomach. Left chest
tube noted without evidence for left-sided pneumothorax. Midline
mediastinal/ pericardial drain evident. The cardio pericardial
silhouette is enlarged. Bibasilar atelectasis noted with small right
pleural effusion, similar to prior. The visualized bony structures
of the thorax are intact. Telemetry leads overlie the chest.
IMPRESSION: Status post CABG with positioning of support apparatus as described.

Cardiomegaly with vascular congestion, basilar atelectasis and small
right pleural effusion.

## 2018-02-02 IMAGING — CR DG CHEST 1V PORT
1 series · 1 of 1 positions shown · non-contrast
Comparison: Portable chest x-ray September 03, 2016.

CLINICAL DATA: Intubated patient status post CABG yesterday

EXAM:
PORTABLE CHEST 1 VIEW

[AP]
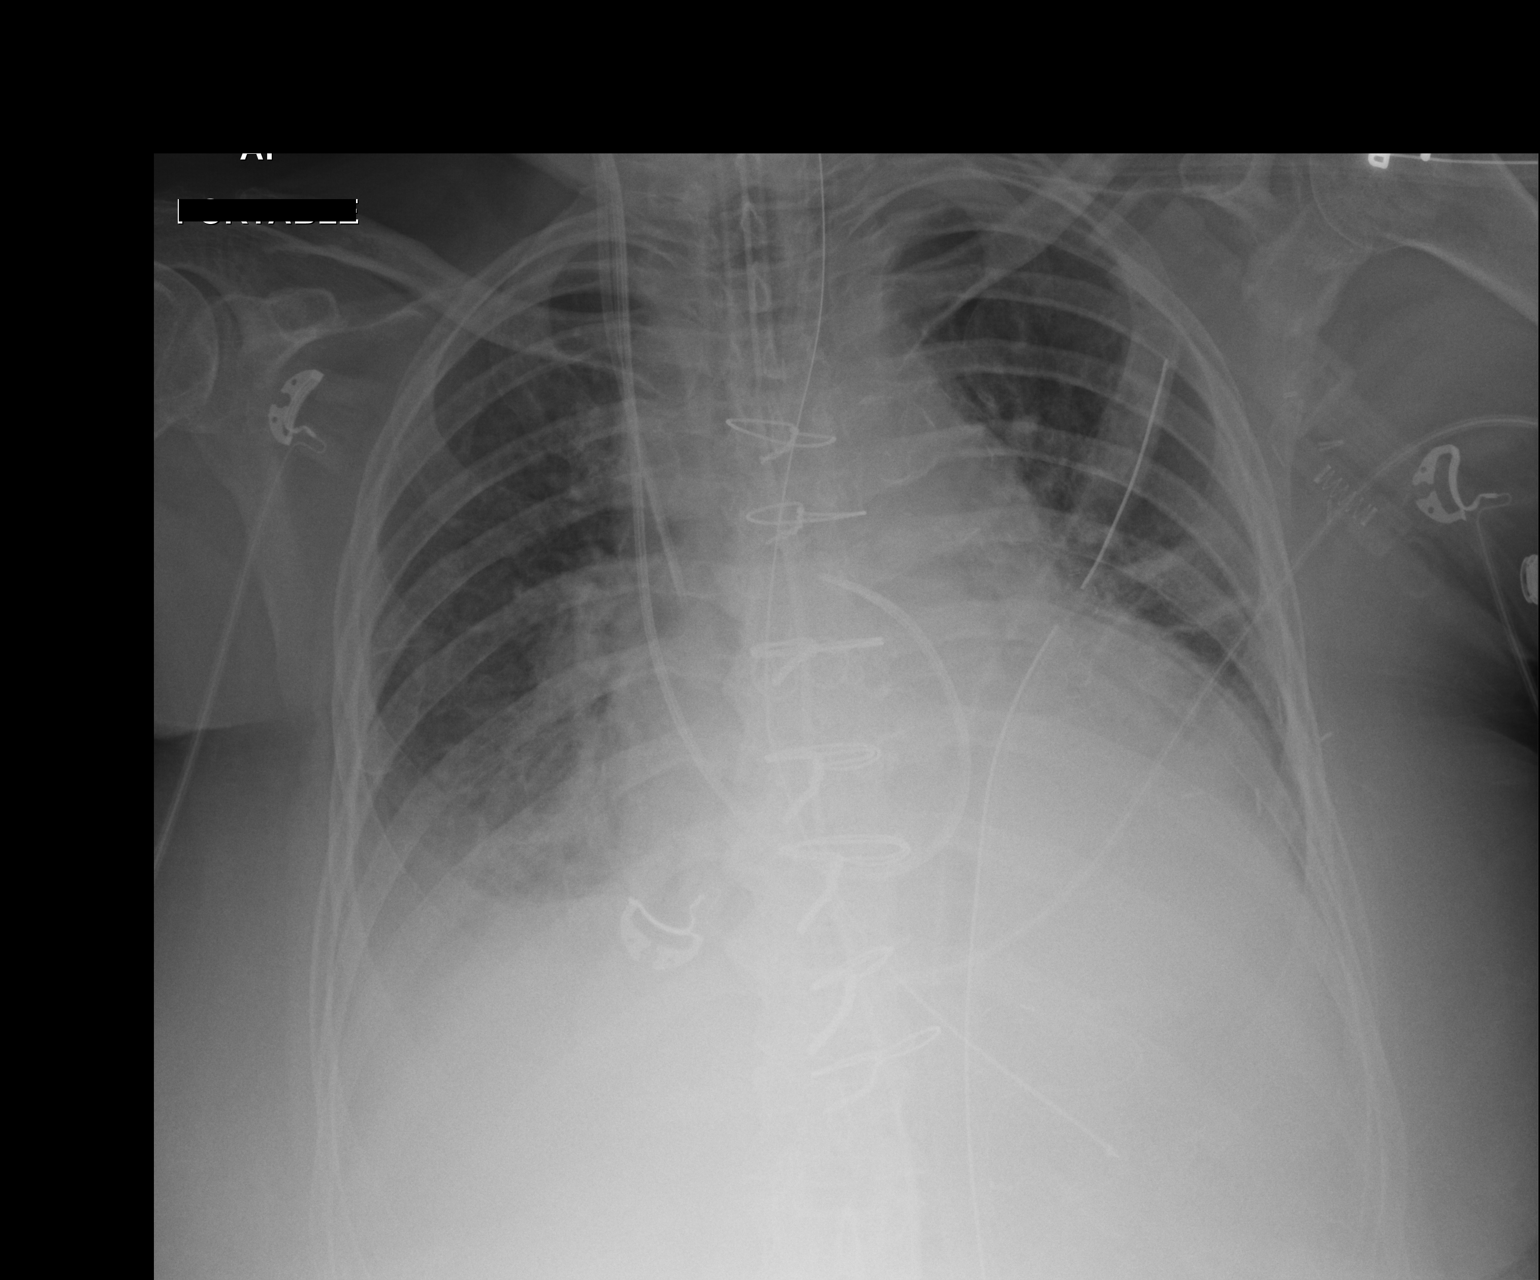

[1 of 1 positions shown; findings below may reference images not displayed]

FINDINGS: The lungs are adequately inflated. There are bilateral pleural
effusions greatest on the right. The cardiac silhouette remains
enlarged. The pulmonary vascularity remains engorged. There is no
pneumothorax. The mediastinal drain and left chest tube are in
stable position. The Swan-Ganz catheter tip projects in the proximal
right main pulmonary artery. The right internal jugular venous
catheter tip projects over the midportion of the SVC. The
endotracheal tube tip lies 4.5 cm above the carina. The
esophagogastric tubes proximal port lies at or just below the GE
junction.
IMPRESSION: CHF with mild interstitial edema no pneumothorax. Small bilateral
pleural effusions greatest on the right. Left lower lobe
atelectasis.

Advancement of the esophagogastric tube by 510 cm is recommended.
The other support tubes are in reasonable position.

## 2018-02-03 IMAGING — DX DG CHEST 1V PORT
1 series · 1 of 1 positions shown · non-contrast
Comparison: 4773 hours on the same day

CLINICAL DATA: Intubated.

EXAM:
PORTABLE CHEST 1 VIEW

[chest ap]
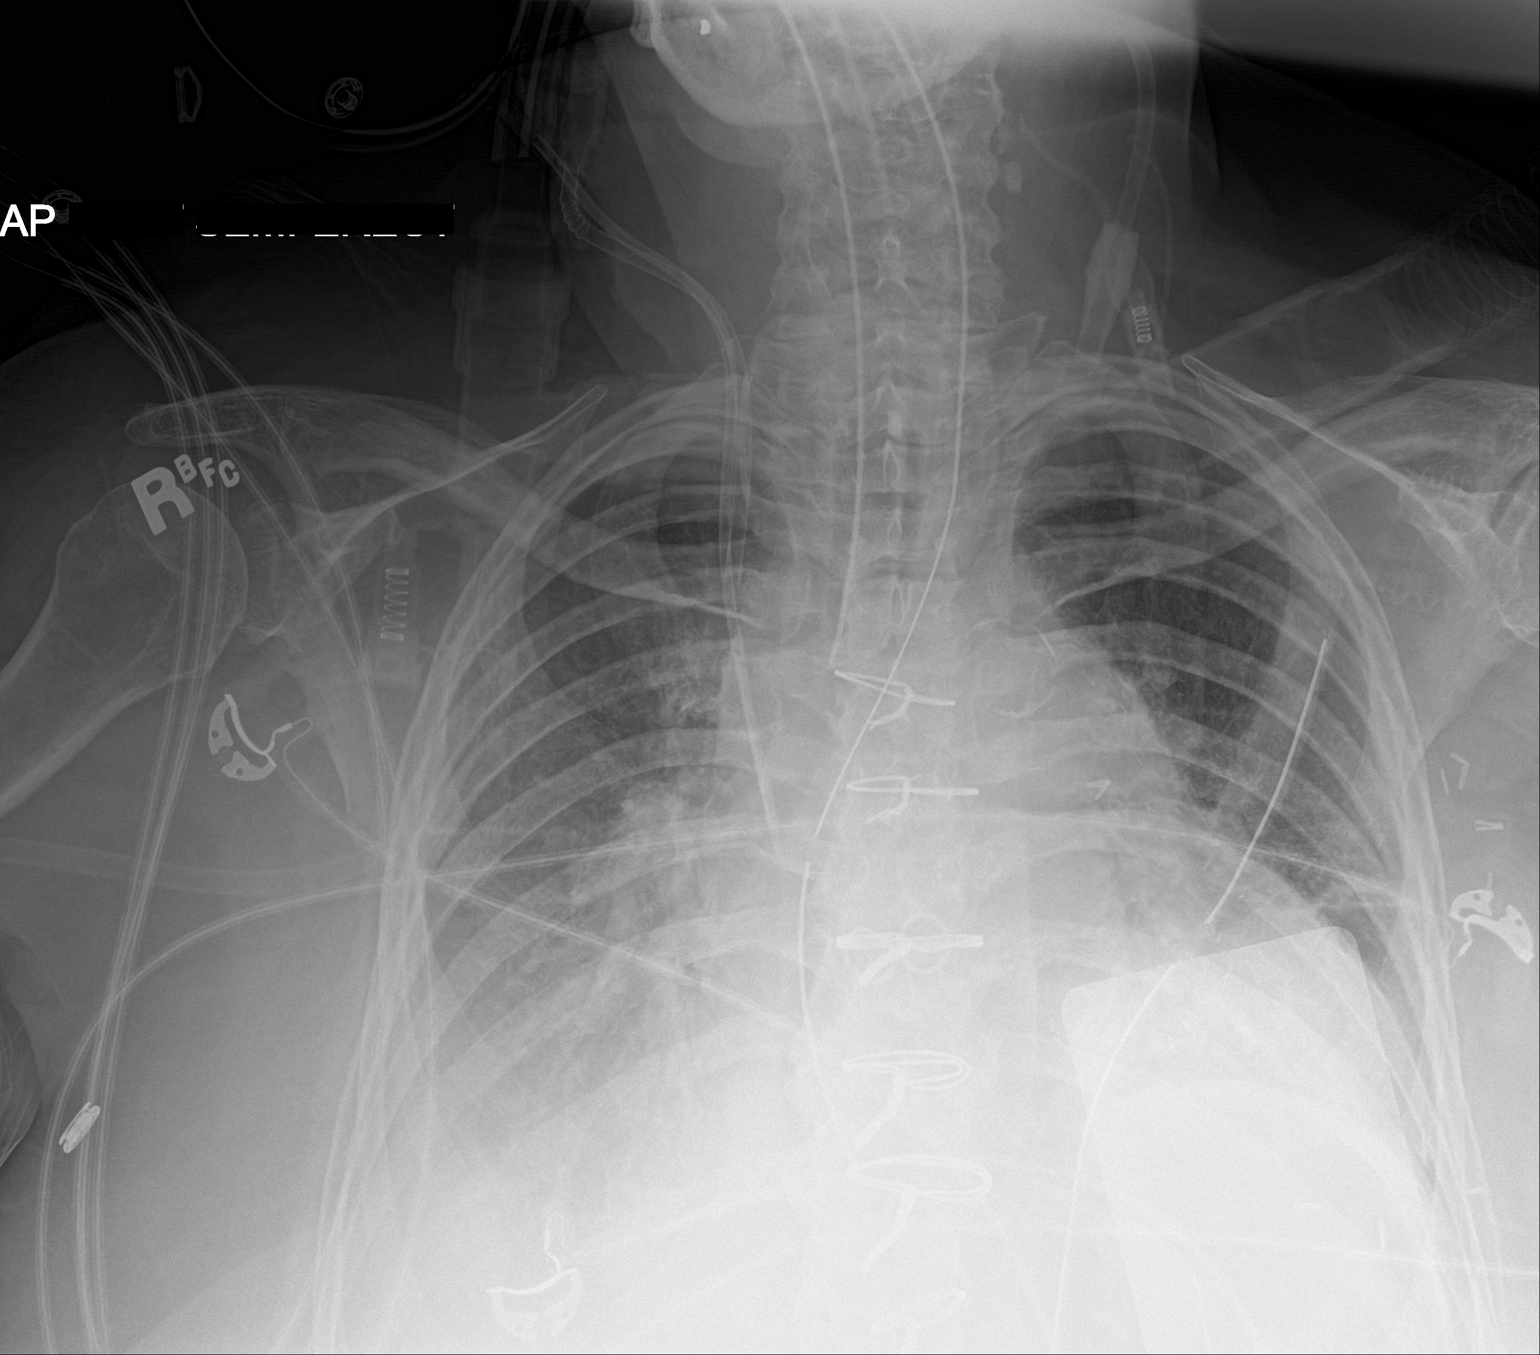

[1 of 1 positions shown; findings below may reference images not displayed]

FINDINGS: Low lung volumes with decrease in CHF. Low-lying endotracheal tube
approximately 1.9 cm above the carina. Pullback approximately 1-2
cm. The tendon and side port of a gastric tube are proximal to the
GE junction. Further advancement of the gastric tube by 13 cm
recommended. Unchanged left-sided chest tube. Right IJ central line
is in the distal SVC. Median sternotomy sutures are noted. There is
stable cardiomegaly with aortic atherosclerosis. Swan-Ganz catheter
has been removed.
IMPRESSION: 1. Slight decrease in CHF. Stable cardiomegaly with aortic
atherosclerosis.
2. Low-lying endotracheal tube approximately 1.9 cm above the
carina. Pullback 1-2 cm recommended.
3. Further advancement of gastric tube by 13 cm suggested as the end
and side port are above the GE junction.

## 2019-05-13 IMAGING — US US RENAL
1 series · 14 of 25 positions shown · non-contrast
Comparison: CT abdomen pelvis 12/13/2015

CLINICAL DATA: Chronic kidney disease

EXAM:
RENAL / URINARY TRACT ULTRASOUND COMPLETE

[Series 1: us renal · 0.19mm/px · 14 of 36 slices shown]
[im 1/36]
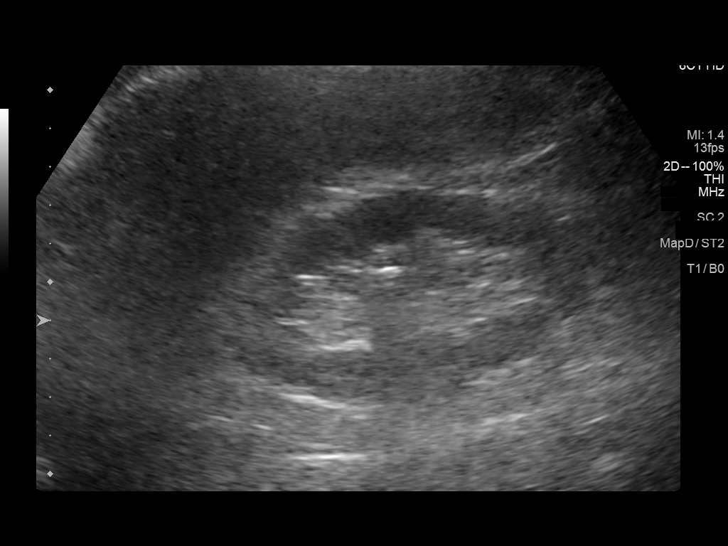
[im 3/36]
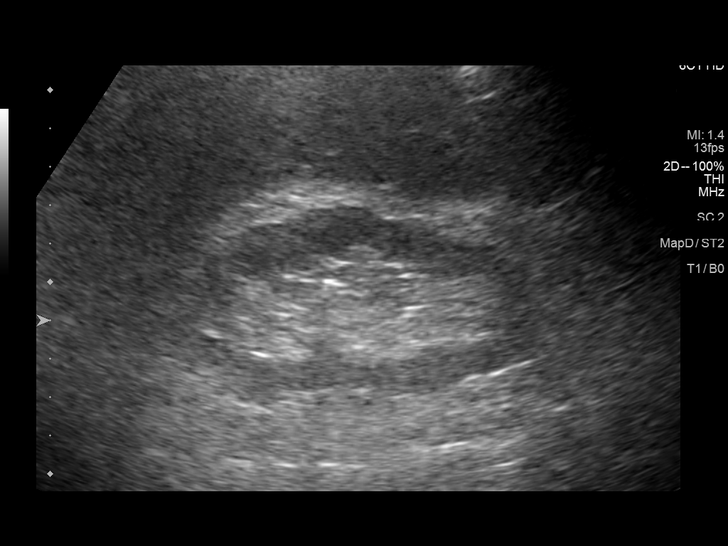
[im 6/36]
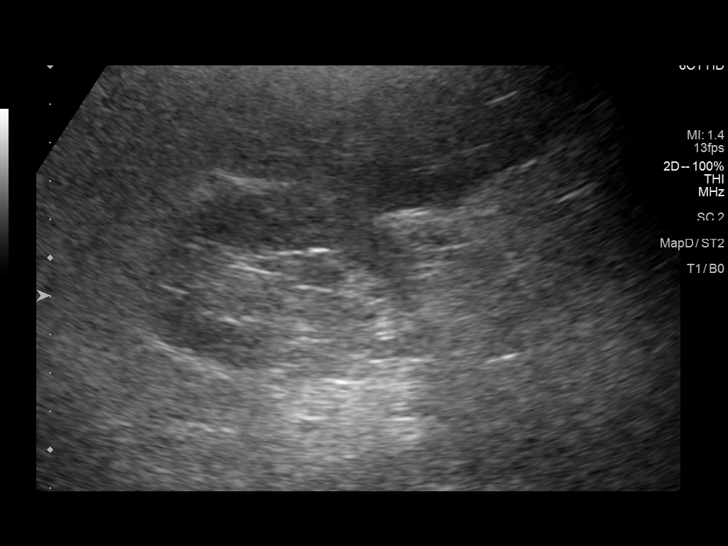
[im 9/36]
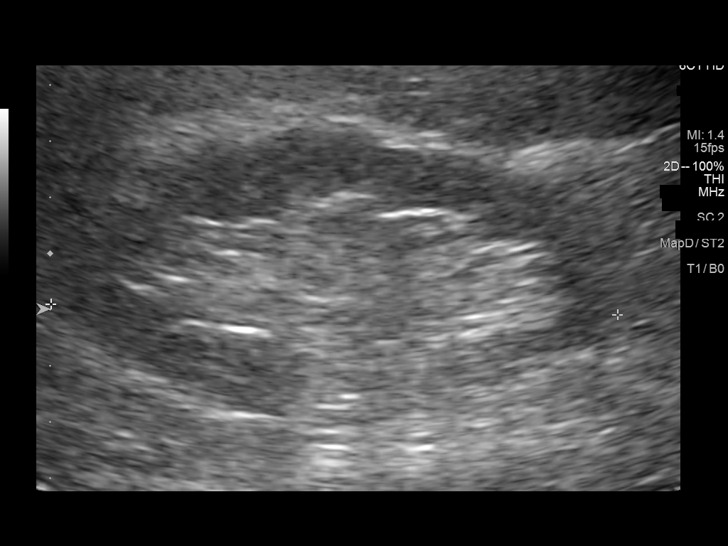
[im 12/36]
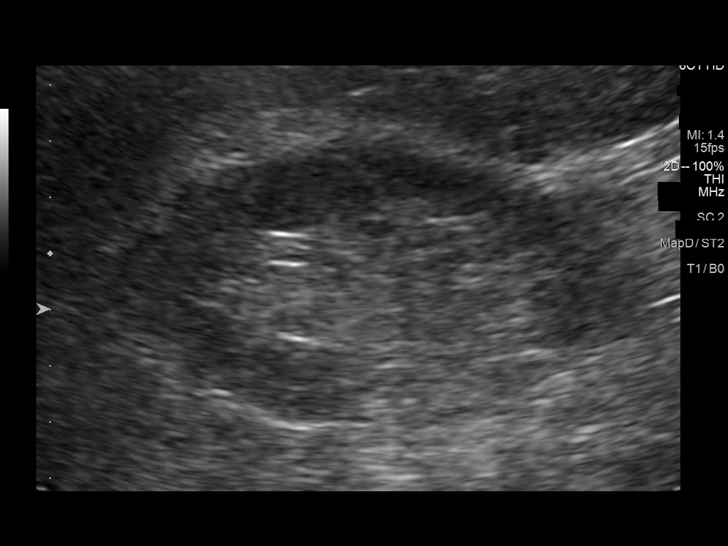
[im 14/36]
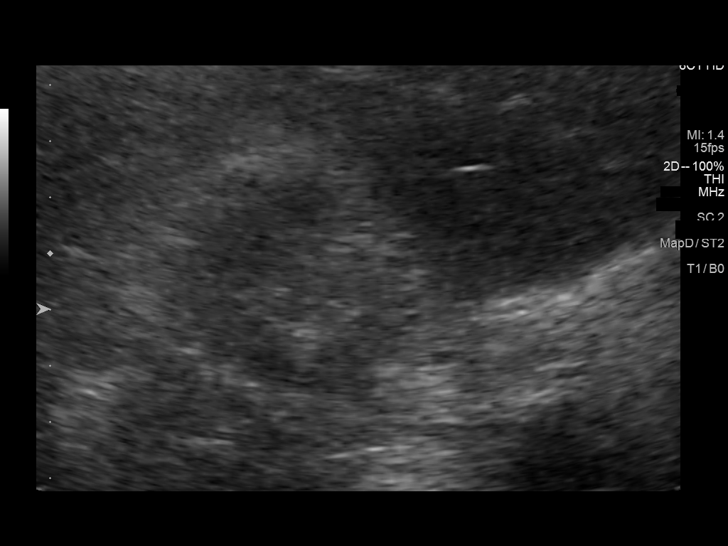
[im 17/36]
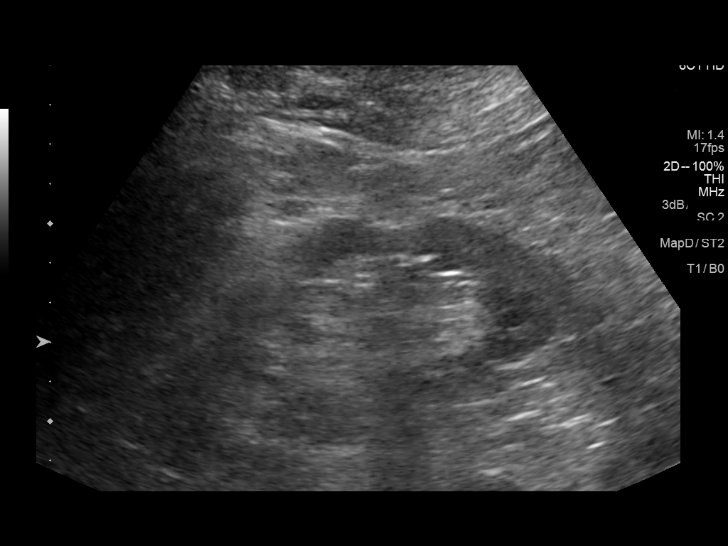
[im 19/36]
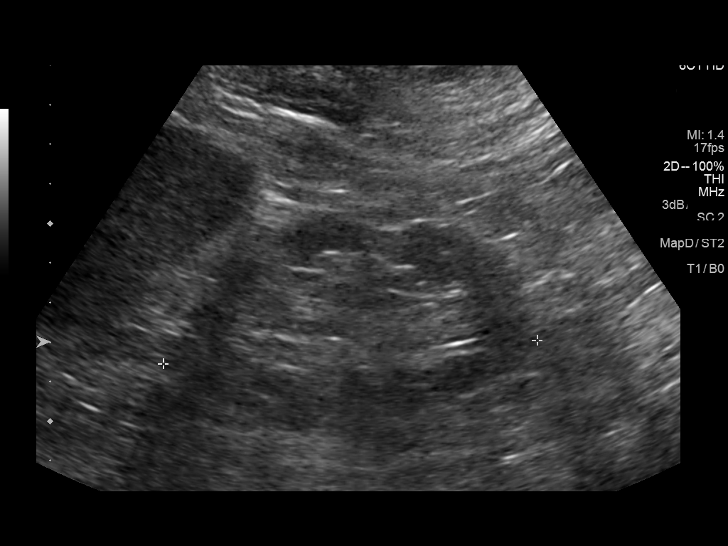
[im 22/36]
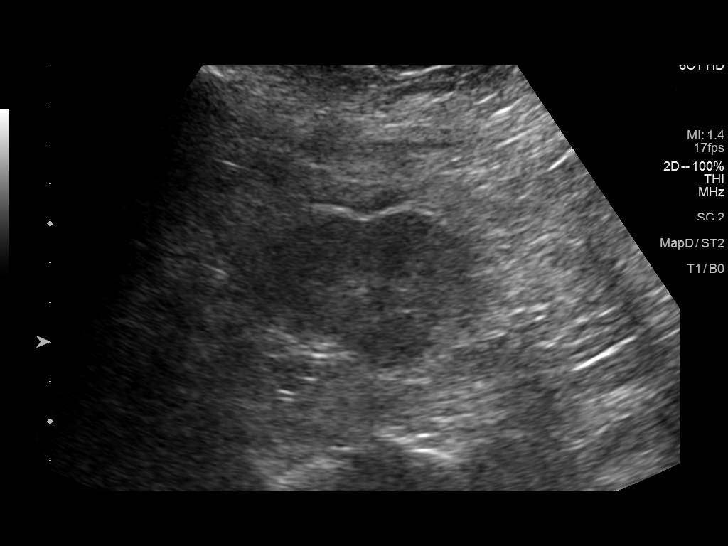
[im 24/36]
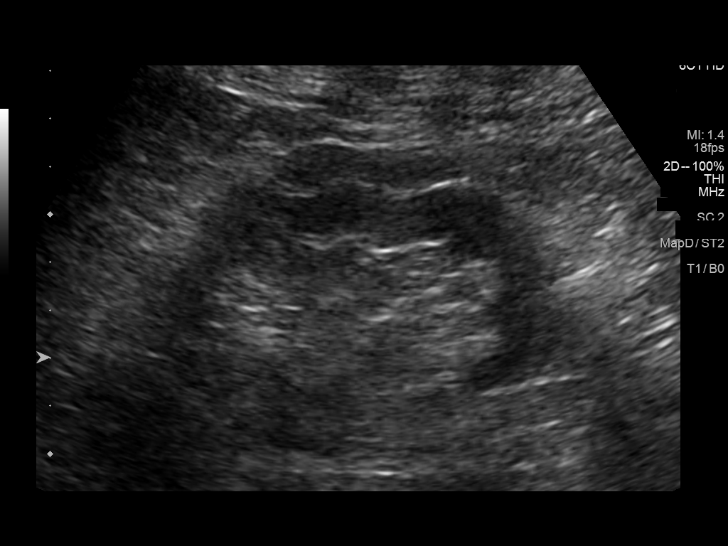
[im 27/36]
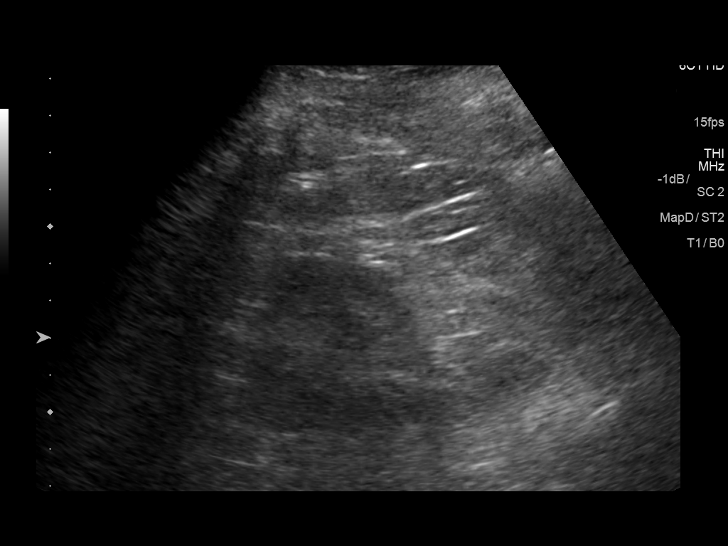
[im 30/36]
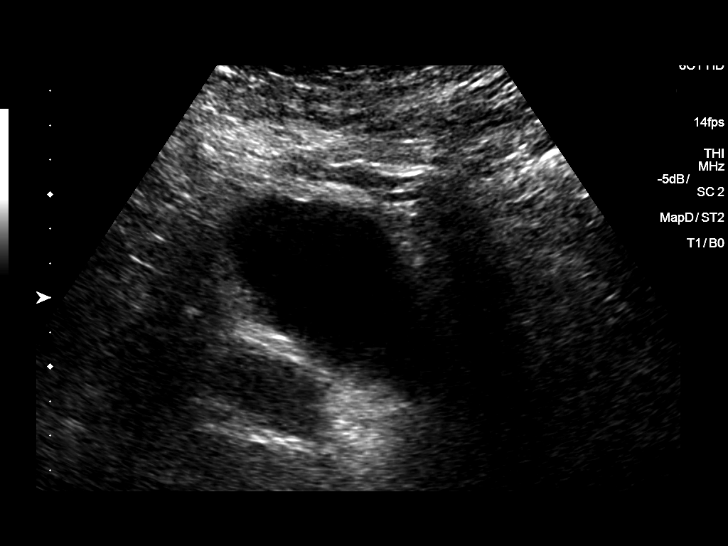
[im 33/36]
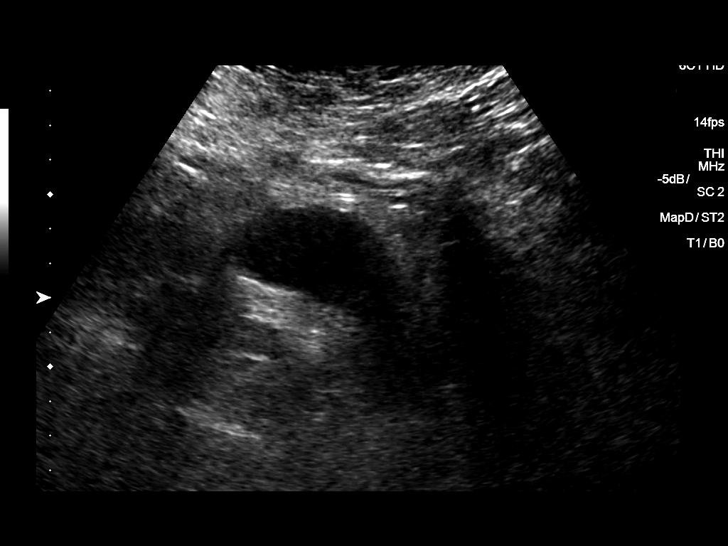
[im 36/36]
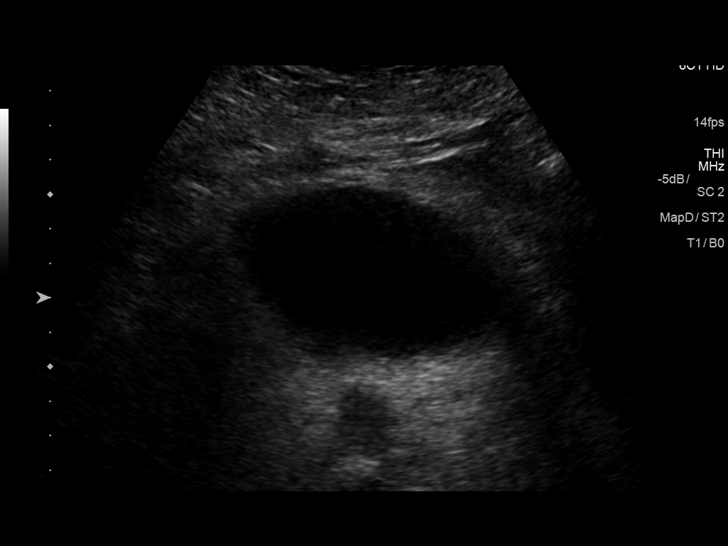

[14 of 25 positions shown; findings below may reference images not displayed]

FINDINGS: Right Kidney:

Length: 10.1 cm. Mild cortical thinning. Echogenicity within normal
limits. No mass or hydronephrosis visualized.

Left Kidney:

Length: 9.5 cm. Mild cortical thinning. Echogenicity within normal
limits. No mass or hydronephrosis visualized.

Bladder:

Appears normal for degree of bladder distention.
IMPRESSION: Mild bilateral cortical thinning. Otherwise normal renal ultrasound.
